# Patient Record
Sex: Female | Born: 1935 | Race: White | Hispanic: No | Marital: Single | Smoking: Never smoker
Health system: Southern US, Community
[De-identification: ages and names within clinical notes are randomized; demographics above are authoritative.]

## PROBLEM LIST (undated history)

## (undated) DIAGNOSIS — Z95820 Peripheral vascular angioplasty status with implants and grafts: Secondary | ICD-10-CM

## (undated) DIAGNOSIS — E7800 Pure hypercholesterolemia, unspecified: Secondary | ICD-10-CM

## (undated) DIAGNOSIS — C44301 Unspecified malignant neoplasm of skin of nose: Secondary | ICD-10-CM

## (undated) DIAGNOSIS — I10 Essential (primary) hypertension: Secondary | ICD-10-CM

## (undated) HISTORY — PX: TONSILLECTOMY: SUR1361

## (undated) HISTORY — PX: APPENDECTOMY: SHX54

---

## 1998-10-18 ENCOUNTER — Other Ambulatory Visit: Admission: RE | Admit: 1998-10-18 | Discharge: 1998-10-18 | Payer: Self-pay | Admitting: Family Medicine

## 1999-04-22 ENCOUNTER — Encounter: Admission: RE | Admit: 1999-04-22 | Discharge: 1999-04-22 | Payer: Self-pay | Admitting: Family Medicine

## 1999-04-22 ENCOUNTER — Encounter: Payer: Self-pay | Admitting: Family Medicine

## 1999-04-29 ENCOUNTER — Ambulatory Visit (HOSPITAL_BASED_OUTPATIENT_CLINIC_OR_DEPARTMENT_OTHER): Admission: RE | Admit: 1999-04-29 | Discharge: 1999-04-29 | Payer: Self-pay | Admitting: Plastic Surgery

## 1999-04-29 ENCOUNTER — Encounter (INDEPENDENT_AMBULATORY_CARE_PROVIDER_SITE_OTHER): Payer: Self-pay | Admitting: Specialist

## 1999-12-06 ENCOUNTER — Other Ambulatory Visit: Admission: RE | Admit: 1999-12-06 | Discharge: 1999-12-06 | Payer: Self-pay | Admitting: Family Medicine

## 2000-01-26 ENCOUNTER — Ambulatory Visit (HOSPITAL_COMMUNITY): Admission: AD | Admit: 2000-01-26 | Discharge: 2000-01-27 | Payer: Self-pay | Admitting: Cardiology

## 2000-03-21 ENCOUNTER — Ambulatory Visit (HOSPITAL_COMMUNITY): Admission: RE | Admit: 2000-03-21 | Discharge: 2000-03-21 | Payer: Self-pay | Admitting: Gastroenterology

## 2000-03-21 ENCOUNTER — Encounter (INDEPENDENT_AMBULATORY_CARE_PROVIDER_SITE_OTHER): Payer: Self-pay | Admitting: Specialist

## 2000-10-10 ENCOUNTER — Encounter: Admission: RE | Admit: 2000-10-10 | Discharge: 2000-12-26 | Payer: Self-pay | Admitting: Family Medicine

## 2001-01-21 ENCOUNTER — Other Ambulatory Visit: Admission: RE | Admit: 2001-01-21 | Discharge: 2001-01-21 | Payer: Self-pay | Admitting: Family Medicine

## 2002-01-24 ENCOUNTER — Other Ambulatory Visit: Admission: RE | Admit: 2002-01-24 | Discharge: 2002-01-24 | Payer: Self-pay | Admitting: Family Medicine

## 2002-09-23 ENCOUNTER — Ambulatory Visit (HOSPITAL_COMMUNITY): Admission: RE | Admit: 2002-09-23 | Discharge: 2002-09-23 | Payer: Self-pay | Admitting: Neurosurgery

## 2002-09-23 ENCOUNTER — Encounter: Payer: Self-pay | Admitting: Neurosurgery

## 2002-09-25 ENCOUNTER — Ambulatory Visit (HOSPITAL_COMMUNITY): Admission: RE | Admit: 2002-09-25 | Discharge: 2002-09-25 | Payer: Self-pay | Admitting: Neurosurgery

## 2003-02-05 ENCOUNTER — Other Ambulatory Visit: Admission: RE | Admit: 2003-02-05 | Discharge: 2003-02-05 | Payer: Self-pay | Admitting: Family Medicine

## 2003-09-05 ENCOUNTER — Emergency Department (HOSPITAL_COMMUNITY): Admission: EM | Admit: 2003-09-05 | Discharge: 2003-09-05 | Payer: Self-pay | Admitting: Emergency Medicine

## 2003-09-22 ENCOUNTER — Encounter: Admission: RE | Admit: 2003-09-22 | Discharge: 2003-12-21 | Payer: Self-pay | Admitting: Family Medicine

## 2003-11-05 ENCOUNTER — Ambulatory Visit (HOSPITAL_COMMUNITY): Admission: RE | Admit: 2003-11-05 | Discharge: 2003-11-06 | Payer: Self-pay | Admitting: Cardiology

## 2004-01-28 ENCOUNTER — Ambulatory Visit (HOSPITAL_COMMUNITY): Admission: RE | Admit: 2004-01-28 | Discharge: 2004-01-28 | Payer: Self-pay | Admitting: Cardiology

## 2004-03-09 ENCOUNTER — Other Ambulatory Visit: Admission: RE | Admit: 2004-03-09 | Discharge: 2004-03-09 | Payer: Self-pay | Admitting: Family Medicine

## 2005-03-14 ENCOUNTER — Other Ambulatory Visit: Admission: RE | Admit: 2005-03-14 | Discharge: 2005-03-14 | Payer: Self-pay | Admitting: Family Medicine

## 2005-09-25 ENCOUNTER — Encounter: Admission: RE | Admit: 2005-09-25 | Discharge: 2005-10-19 | Payer: Self-pay | Admitting: Family Medicine

## 2006-03-19 ENCOUNTER — Other Ambulatory Visit: Admission: RE | Admit: 2006-03-19 | Discharge: 2006-03-19 | Payer: Self-pay | Admitting: Family Medicine

## 2006-07-23 ENCOUNTER — Emergency Department (HOSPITAL_COMMUNITY): Admission: EM | Admit: 2006-07-23 | Discharge: 2006-07-23 | Payer: Self-pay | Admitting: Emergency Medicine

## 2007-01-02 ENCOUNTER — Other Ambulatory Visit: Admission: RE | Admit: 2007-01-02 | Discharge: 2007-01-02 | Payer: Self-pay | Admitting: Family Medicine

## 2010-07-22 ENCOUNTER — Ambulatory Visit: Payer: Medicare Other | Attending: Orthopedic Surgery | Admitting: Physical Therapy

## 2010-07-22 DIAGNOSIS — M25539 Pain in unspecified wrist: Secondary | ICD-10-CM | POA: Insufficient documentation

## 2010-07-22 DIAGNOSIS — M6281 Muscle weakness (generalized): Secondary | ICD-10-CM | POA: Insufficient documentation

## 2010-07-22 DIAGNOSIS — IMO0001 Reserved for inherently not codable concepts without codable children: Secondary | ICD-10-CM | POA: Insufficient documentation

## 2010-07-22 DIAGNOSIS — R5381 Other malaise: Secondary | ICD-10-CM | POA: Insufficient documentation

## 2010-07-26 ENCOUNTER — Ambulatory Visit (HOSPITAL_COMMUNITY): Payer: Medicare Other | Attending: Family Medicine

## 2010-07-26 DIAGNOSIS — M810 Age-related osteoporosis without current pathological fracture: Secondary | ICD-10-CM | POA: Insufficient documentation

## 2010-07-27 ENCOUNTER — Ambulatory Visit: Payer: Medicare Other | Admitting: Physical Therapy

## 2010-07-29 ENCOUNTER — Ambulatory Visit: Payer: Medicare Other | Attending: Orthopedic Surgery | Admitting: Physical Therapy

## 2010-07-29 DIAGNOSIS — R5381 Other malaise: Secondary | ICD-10-CM | POA: Insufficient documentation

## 2010-07-29 DIAGNOSIS — M25539 Pain in unspecified wrist: Secondary | ICD-10-CM | POA: Insufficient documentation

## 2010-07-29 DIAGNOSIS — M6281 Muscle weakness (generalized): Secondary | ICD-10-CM | POA: Insufficient documentation

## 2010-07-29 DIAGNOSIS — IMO0001 Reserved for inherently not codable concepts without codable children: Secondary | ICD-10-CM | POA: Insufficient documentation

## 2010-08-02 ENCOUNTER — Ambulatory Visit: Payer: Medicare Other | Admitting: Physical Therapy

## 2010-08-04 ENCOUNTER — Ambulatory Visit: Payer: Medicare Other | Admitting: Physical Therapy

## 2010-08-09 ENCOUNTER — Ambulatory Visit: Payer: Medicare Other | Admitting: Physical Therapy

## 2010-08-12 ENCOUNTER — Ambulatory Visit: Payer: Medicare Other | Admitting: Physical Therapy

## 2010-08-16 ENCOUNTER — Ambulatory Visit: Payer: Medicare Other | Admitting: Physical Therapy

## 2010-08-18 ENCOUNTER — Encounter: Payer: Medicare Other | Admitting: Physical Therapy

## 2010-08-23 ENCOUNTER — Encounter: Payer: Medicare Other | Admitting: Physical Therapy

## 2010-08-26 ENCOUNTER — Encounter: Payer: Medicare Other | Admitting: Physical Therapy

## 2010-09-23 ENCOUNTER — Other Ambulatory Visit: Payer: Self-pay | Admitting: Family Medicine

## 2010-09-27 ENCOUNTER — Ambulatory Visit
Admission: RE | Admit: 2010-09-27 | Discharge: 2010-09-27 | Disposition: A | Discharge status: Home / Self Care | Payer: Medicare Other | Source: Ambulatory Visit | Attending: Family Medicine | Admitting: Family Medicine

## 2010-09-29 ENCOUNTER — Other Ambulatory Visit: Payer: Self-pay | Admitting: Family Medicine

## 2010-09-29 DIAGNOSIS — N281 Cyst of kidney, acquired: Secondary | ICD-10-CM

## 2010-10-03 ENCOUNTER — Ambulatory Visit
Admission: RE | Admit: 2010-10-03 | Discharge: 2010-10-03 | Disposition: A | Discharge status: Home / Self Care | Payer: Medicare Other | Source: Ambulatory Visit | Attending: Family Medicine | Admitting: Family Medicine

## 2010-10-03 DIAGNOSIS — N281 Cyst of kidney, acquired: Secondary | ICD-10-CM

## 2010-10-14 NOTE — Procedures (Signed)
[facility]  Patient:    Jennifer, Gordon                        MRN: [id number] Proc. Date: 03/21/00 Adm. Date:  [id number] Disc. Date: [id number] Attending:  Corliss Marcus CC:         Dr. Illa Level   Procedure Report  PROCEDURE:  Colonoscopy with polypectomy.  INDICATION FOR PROCEDURE:  Screening colonoscopy in a patient with no prior screening in the last 10 years, who has also had some nonspecific abdominal complaints.  DESCRIPTION OF PROCEDURE:  The patient was placed in the left lateral decubitus position and placed on the pulse monitor with continuous low-flow oxygen delivered by nasal cannula.  She was sedated with 60 mg IV Demerol and 5 mg IV Versed.  The Olympus video colonoscope was inserted into the rectum and advanced to the cecum, confirmed by transillumination at McBurneys point and visualization of the ileocecal valve and appendiceal orifice.  The prep was excellent.  The cecum, ascending, transverse, descending, and proximal sigmoid colon all appeared normal with no masses, polyps, diverticula, or other mucosal abnormalities.  At approximately 20 cm near the rectosigmoid junction, there was seen a 6 mm sessile polyp, which was fulgurated by hot biopsy.  The remainder of the sigmoid and rectum appeared normal, and on retroflex view the anus revealed no obvious internal hemorrhoids.  The colonoscope was then withdrawn and the patient returned to the recovery room in stable condition.  She tolerated the procedure well, and there were no immediate complications.  IMPRESSION:  Small rectosigmoid colon polyp, otherwise normal colonoscopy.  PLAN:  Await histology for determination of need for future surveillance colonoscopies. DD:  03/21/00 TD:  03/22/00 Job: [id number]

## 2010-10-14 NOTE — Cardiovascular Report (Signed)
NAME:  Jennifer Gordon, Jennifer Gordon                         ACCOUNT NO.:  [id number]   MEDICAL RECORD NO.:  [id number]                   PATIENT TYPE:  OIB   LOCATION:  6527                                 FACILITY:  [facility]   PHYSICIAN:  Francisca December, M.D.               DATE OF BIRTH:  04-Jul-1921   DATE OF PROCEDURE:  11/05/2003  DATE OF DISCHARGE:  11/06/2003                              CARDIAC CATHETERIZATION   PROCEDURE PERFORMED:  1. Left heart catheterization.  2. Coronary angiography.  3. Left ventriculogram.  4. Arterial conduit angiography.  5. PCI/drug-eluting stent implantation mid LAD.  6. Intravascular ultrasound.   INDICATION:  Jennifer Gordon is a 77 year old woman with known ASCVD, status post  PCI/stent implantation LCX in 1998 and RCA in 2001.  She has had persistent  and worsening exertional dyspnea which has been her presenting symptom  previously.  Recent Cardiolite with pharmacologic stress revealed a moderate  reversible anterior, anterolateral and anteroapical defect.  She is  therefore brought to the catheterization laboratory at this time to identify  the extent of disease and provide for further therapeutic options.   PROCEDURAL NOTE:  The patient was brought to the cardiac catheterization  laboratory in the fasting state.  The right groin was prepped and draped in  the usual sterile fashion.  Local anesthesia was obtained with the  infiltration of 1% lidocaine.  A 5 French catheter sheath was inserted  percutaneously into the right femoral artery utilizing an anterior approach  over a guiding J wire.  A 110-cm pigtail catheter was used to measure  pressures in the ascending aorta and in the left ventricle both prior to and  following the ventriculogram.  A 30-degree RAO cine left ventriculogram was  performed utilizing a power injector.  43 mL of contrast material were  injected at 13 mL per second.  Cineangiography was then performed using 5  [place] #4 left and right  Judkins catheters.  Following this, sublingual  administration of 0.4 mg nitroglycerin and cineangiography of each coronary  artery was conducted in multiple LAO and RAO projections.  All catheter  manipulations were performed using fluoroscopic observation and exchanges  performed over a long guiding J wire.   I then prepared for intravascular ultrasound.  The 5 French catheter and  sheath was exchanged for a 6 French catheter sheath.  The patient received  3000 units of heparin intravenously resulting in an ACT of only 123.  Therefore, she received an additional 1500 units of heparin resulting in an  ACT of 297 seconds.  A 6 French #3.5 CLS guiding catheter was advanced to  the ascending aorta where the left coronary os was engaged.  A 0.014 inch  Scimed Luge intracoronary guide wire was passed across a moderate appearing  obstruction in the mid LAD.  The Scimed Atlantis ultrasound catheter was  used to image the mid portion of the LAD in a  single pullback.  The images  were analyzed and a decision made to proceed with coronary intervention.  The patient received a bolus of Aggrastat of 25 mcg/kg intravenously and  then a constant infusion of 0.15 mcg/kg/min.  Initial attempt was made to  dilate the lesion with a 2.5/6 mm Scimed cutting balloon.  However, could  not get this device to cross the lesion.  In removing the device, the guide  catheter position was lost as well as the guide wire.  The entire guiding  catheter and wire were removed.  A 4.0 CLS 6 [place] guiding catheter was  then advanced to the ascending aorta where the left coronary os was again  engaged.  A 0.014 inch Scimed Luge wire was used to cross the lesion again.  The lesion was then dilated using a 2.5/9 mm [place] Monorail.  This was inflated  to 9 atmospheres for 15 seconds.  This balloon was deflated and removed and  a 2.75/12 mm Scimed Taxus Express 2 intracoronary drug-eluting stent was  advanced in position.  It was  carefully deployed at a peak pressure of 12  atmospheres for 33 seconds.  The stent balloon was then removed and IVUS  intravascular ultrasound repeated.  It showed incomplete expansion of the  stent at the lesion and incomplete stent strut deployment in the proximal  portion of the stent.  Therefore, a 3.0/9 mm Scimed [place] Monorail balloon was  advanced into place and positioned in the proximal portion of the stent and  inflated to 16 atmospheres for 54 seconds.  This ballon was removed and  intravascular ultrasound repeated.  It showed good stent strut apposition  and adequate expansion of the lesion.  Therefore, the guide wire was removed  and angiography repeated in orthogonal views.  Finally, the guiding catheter  was removed and the catheter sheath was flushed.  A 45-degree LAO angiogram  of the right femoral artery was performed using hand injection.  It  documented the arteriotomy site to be well above the bifurcation into the  profunda femoris and superficial femoral arteries and there was no extensive  atherosclerosis in the femoral artery which was at least 6-7 mm in diameter.  Subsequent hemostasis was achieved by deployment of the Angio-Seal system  which was completely successful.  The patient was then removed from the  catheterization table and transported to the recovery area  in stable  condition with an intact distal pulse.   HEMODYNAMICS:  Systemic arterial pressure was 149/63 with a mean of 100  mmHg.  There was no systolic gradient across the aortic valve.  The left  ventricular end-diastolic pressure was 11 mmHg preventriculogram.   ANGIOGRAPHY:  The left ventriculogram demonstrated normal chamber size and  normal global systolic function.  There was extensive coronary calcification  seen.  No regional wall motion abnormalities were present.  A visual  estimate of the ejection fraction is in the 65-70% range.  There was no significant mitral regurgitation in the aortic  valve the trileaflet and  opens normally during systole.   There was a right dominant coronary system present.  The main left coronary  artery showed a 10% proximal narrowing.  The left anterior descending artery  and its branches were highly diseased.  There was an eccentric ill-defined  hazy mid vessel stenosis at the origin of a second diagonal branch.  The  second diagonal branch itself was small to moderate in size.  The ongoing  anterior descending artery was tortuous  and reached as well as barely  traversed the apex.  There was a 30-40% stenosis in the distal portion at  the origin of the third diagonal branch.  The mid vessel lesion is the  target for the percutaneous treatment today.  By angiography, the degree of  stenosis was not greater than 50%.   The left circumflex coronary artery and its branches were widely patent.  This is the site of previous stent implantation.  The stent is widely  patent.  There is a 30% narrowing at the origin of the circumflex.  The  circumflex amounts to a single large marginal which bifurcates on the  posterior lateral wall of the heart and there is no significant distal  obstructions.   The right coronary artery and its branches were highly diseased.  This  vessel contains a mid portion stent which is approximately 20-25 mm in  length.  There is perhaps 40% in-stent diffuse restenosis.  There is no  significant proximal or distal obstructions.  Large posterior descending and  large posterior lateral segment arise.  The posterior descending artery  contains a 50% proximal narrowing and the posterior lateral segment gives  rise to three small left ventricular branches.   Collateral vessels are not seen.   Following intravascular ultrasound, balloon dilatation, drug-eluting stent  implantation, there was no residual stenosis in the anterior descending  artery by angiography.  There was a good stepup and stepdown at the  proximal and distal  end of the stent.  A diagonal branch arises within the  proximal portion of the stent and is generally unaffected.   INTRAVASCULAR ULTRASOUND:  Initial reference vessel and lesion dimensions  are as follows.  The reference is 3.2 x 2.6 mm in diameter.  The lesion is  1.7 x 1.3 mm in diameter.  There is a 73% stenosis by ultrasound.  Following  balloon dilatation, stent implantation there is a 2.7 x 2.5 minimal luminal  dimension and the luminal area is 5.8 sq mm.   FINAL IMPRESSION:  1. Atherosclerotic coronary vascular disease, three vessel.  2. Status post successful drug-eluting stent implantation mid left anterior     descending with intravascular ultrasound guidance.  3. Intact left ventricular size and global systolic function.  4. Atypical angina was reproduced with balloon inflation and device     insertion.  This was described as warm feeling in the chest.                                               Francisca December, M.D.    JHE/MEDQ  D:  11/05/2003  T:  11/07/2003  Job:  [id number]   cc:   Chales Salmon. Abigail Miyamoto, M.D.  [address]  [place]  [place] [address]  Fax: [phone]

## 2010-10-14 NOTE — Cardiovascular Report (Signed)
[place]  Patient:    Jennifer, Gordon                        MRN: [id number] Proc. Date: 01/26/00 Attending:  Francisca December, M.D. CC:         Chales Salmon. Abigail Miyamoto, M.D.             The Cardiac Catheterization Laboratory                        Cardiac Catheterization  PROCEDURES PERFORMED: 1. Left heart catheterization. 2. Coronary angiography. 3. Left ventriculogram. 4. Percutaneous coronary intervention/stent implantation mid right coronary    artery.  INDICATIONS:  [name] is a 77 year old woman with known ASCVD who is now three years status post PTCA and stent implantation in the left circumflex coronary artery.  Her presenting symptom was dyspnea.  She has had a recurrence of this problem but not quite as severe.  She has undergone a myocardial perfusion study which reproduced her symptoms of moderate dyspnea. She had a reversible lateral wall defect.  She is brought now to the catheterization laboratory to identify possible restenosis of the circumflex marginal branch and/or progressive disease and to provide for further therapeutic options.  PROCEDURAL NOTE:  A left heart catheterization was performed following percutaneous insertion of a #5 French catheter sheath utilizing an anterior approach of her guiding J wire into the right femoral artery.  A 110 cm pigtail catheter was used to measure pressures in the left ventricle and in the aorta, as well as perform left ventriculography.  Then #5 [place] #4 left and right Judkins catheters were used for coronary angiography.  The stented segment of the left circumflex was widely patent.  However, there was a progressive stenosis, 70% tubular in nature, of the mid right coronary artery which represented a changed from her previous angiogram.  The decision was made to proceed with stent implantation in this lesion despite the discordants of this coronary territory with that found on  the Cardiolite.  It did, however, represent a progression of disease.  The #5 [place] catheter sheath was exchanged over a long guiding J wire for a #7 [place] catheter sheath.  A #6 French catheter sheath was inserted into the right femoral vein for intravenous access.  A #7 [place] FR4 Sci-Med wise guide guiding catheter was advanced to the ascending aorta where a right coronary os was engaged.  A 0.014 Sci-Med Luge intracoronary guide wire was passed across the lesion without difficulty.  The lesion was primarily stented with a 3.0/18 mm Sci-Med ______ intracoronary stent.  The pressure was 15 atm, giving an effective balloon diameter of 3.3 mm.  This resulted in an excellent angiographic appearance with a step-up and step-down at the proximal and distal ends of the stent respectively.  It should be noted that the patient did not experience angina pectoris during complete balloon inflation, although there was significant ST segment elevation seen on ECG.  Following confirmation of adequate patency in orthogonal views, both with and without the guide wire in place, the guiding catheter was removed.  Hemostasis was achieved by suturing the sheath in place.  The patient was returned to the recovery area in stable condition with intact distal pulses.  It should be noted that the patient received 4000 units of heparin intravenously prior to initiation of the angioplasty procedure.  She also received an intravenous bolus of  ReoPro in a confident infusion.  HEMODYNAMICS:  Systemic arterial pressure was 150/66 with a mean of 99 mmHg. There was no systolic gradient across the aortic valve.  Left ventricular end diastolic pressure was 16 mmHg.  ANGIOGRAPHY:  The left ventriculogram demonstrated normal left ventricular chamber size and global systolic function without regional wall motion abnormality.  There was left and right coronary calcifications seen, as well as this previously stented  segment.  There was no mitral regurgitation.  The calculated ejection fraction, utilizing a single plane Cine method was 62%.  There was a right dominant coronary system present.  The main left coronary had a 30% narrowing proximally.  The left anterior descending artery and its branches were mildly diseased; there is a 30-40% mid vessel obstruction at this site of the second diagonal branch.  Two diagonal branches arise proximally and are relatively small.  The ongoing vessel is large, transapical, and minimally diseased.  The left circumflex artery and its branches are diseased but without significant obstruction.  The stented segment is in the mid portion of the left circumflex and is widely patent.  There is a very small first marginal branch.  The ongoing circumflex gives rise to a trifurcating marginal branch, which is the major vessel on the posterolateral wall of the heart.  The right coronary artery and its branches are highly diseased; this vessel has a mid portion, tubular, eccentric, 70% stenosis.  The ongoing vessel is large and without significant obstruction.  It gives rise to a large posterior descending artery, a large posterolateral segment, and two small posterolateral branches.  Following balloon dilatation and stent implantation, there was no residual stenosis seen.  In fact, there was a -10% obstruction in that the stented segment was slightly larger than the native artery.  FINAL DIAGNOSES: 1. Atherosclerotic cardiovascular disease, two-vessel. 2. Status post successful percutaneous transluminal coronary angioplasty and    stent implantation, mid right coronary artery. 3. Typical angina was not reproduced with the vice insertion or balloon    inflation. DD:  01/26/00 TD:  01/26/00 Job: [id number]

## 2011-09-01 ENCOUNTER — Other Ambulatory Visit (HOSPITAL_COMMUNITY): Payer: Self-pay | Admitting: *Deleted

## 2011-09-06 ENCOUNTER — Encounter (HOSPITAL_COMMUNITY): Payer: Self-pay

## 2011-09-06 ENCOUNTER — Encounter (HOSPITAL_COMMUNITY)
Admission: RE | Admit: 2011-09-06 | Discharge: 2011-09-06 | Disposition: A | Discharge status: Home / Self Care | Payer: Medicare Other | Source: Ambulatory Visit | Attending: Family Medicine | Admitting: Family Medicine

## 2011-09-06 DIAGNOSIS — M810 Age-related osteoporosis without current pathological fracture: Secondary | ICD-10-CM | POA: Insufficient documentation

## 2011-09-06 HISTORY — DX: Essential (primary) hypertension: I10

## 2011-09-06 HISTORY — DX: Peripheral vascular angioplasty status with implants and grafts: Z95.820

## 2011-09-06 HISTORY — DX: Pure hypercholesterolemia, unspecified: E78.00

## 2011-09-06 HISTORY — DX: Unspecified malignant neoplasm of skin of nose: C44.301

## 2011-09-06 MED ORDER — SODIUM CHLORIDE 0.9 % IV SOLN
Freq: Once | INTRAVENOUS | Status: AC
  Administered 2011-09-06: 14:00:00 via INTRAVENOUS

## 2011-09-06 MED ORDER — ZOLEDRONIC ACID 5 MG/100ML IV SOLN
5.0000 mg | Freq: Once | INTRAVENOUS | Status: AC
  Administered 2011-09-06: 5 mg via INTRAVENOUS
  Filled 2011-09-06: qty 100

## 2011-09-06 NOTE — Discharge Instructions (Signed)
Zoledronic Acid injection (Paget's Disease, Osteoporosis) What is this medicine? ZOLEDRONIC ACID (ZOE le dron ik AS id) lowers the amount of calcium loss from bone. It is used to treat Paget's disease and osteoporosis in women. This medicine may be used for other purposes; ask your health care provider or pharmacist if you have questions. What should I tell my health care provider before I take this medicine? They need to know if you have any of these conditions: -aspirin-sensitive asthma -dental disease -kidney disease -low levels of calcium in the blood -past surgery on the parathyroid gland or intestines -an unusual or allergic reaction to zoledronic acid, other medicines, foods, dyes, or preservatives -pregnant or trying to get pregnant -breast-feeding How should I use this medicine? This medicine is for infusion into a vein. It is given by a health care professional in a hospital or clinic setting. Talk to your pediatrician regarding the use of this medicine in children. This medicine is not approved for use in children. Overdosage: If you think you have taken too much of this medicine contact a poison control center or emergency room at once. NOTE: This medicine is only for you. Do not share this medicine with others. What if I miss a dose? It is important not to miss your dose. Call your doctor or health care professional if you are unable to keep an appointment. What may interact with this medicine? -certain antibiotics given by injection -NSAIDs, medicines for pain and inflammation, like ibuprofen or naproxen -some diuretics like bumetanide, furosemide -teriparatide This list may not describe all possible interactions. Give your health care provider a list of all the medicines, herbs, non-prescription drugs, or dietary supplements you use. Also tell them if you smoke, drink alcohol, or use illegal drugs. Some items may interact with your medicine. What should I watch for while  using this medicine? Visit your doctor or health care professional for regular checkups. It may be some time before you see the benefit from this medicine. Do not stop taking your medicine unless your doctor tells you to. Your doctor may order blood tests or other tests to see how you are doing. Women should inform their doctor if they wish to become pregnant or think they might be pregnant. There is a potential for serious side effects to an unborn child. Talk to your health care professional or pharmacist for more information. You should make sure that you get enough calcium and vitamin D while you are taking this medicine. Discuss the foods you eat and the vitamins you take with your health care professional. Some people who take this medicine have severe bone, joint, and/or muscle pain. This medicine may also increase your risk for a broken thigh bone. Tell your doctor right away if you have pain in your upper leg or groin. Tell your doctor if you have any pain that does not go away or that gets worse. What side effects may I notice from receiving this medicine? Side effects that you should report to your doctor or health care professional as soon as possible: -allergic reactions like skin rash, itching or hives, swelling of the face, lips, or tongue -breathing problems -changes in vision -feeling faint or lightheaded, falls -jaw burning, cramping, or pain -muscle cramps, stiffness, or weakness -trouble passing urine or change in the amount of urine Side effects that usually do not require medical attention (report to your doctor or health care professional if they continue or are bothersome): -bone, joint, or muscle pain -fever -  irritation at site where injected -loss of appetite -nausea, vomiting -stomach upset -tired This list may not describe all possible side effects. Call your doctor for medical advice about side effects. You may report side effects to FDA at 1-800-FDA-1088. Where  should I keep my medicine? This drug is given in a hospital or clinic and will not be stored at home. NOTE: This sheet is a summary. It may not cover all possible information. If you have questions about this medicine, talk to your doctor, pharmacist, or health care provider.  2012, [employer] Standard. (11/11/2010 9:08:15 AM) 

## 2013-01-03 DIAGNOSIS — M170 Bilateral primary osteoarthritis of knee: Secondary | ICD-10-CM | POA: Insufficient documentation

## 2013-07-02 ENCOUNTER — Ambulatory Visit: Payer: Medicare Other | Attending: Family Medicine | Admitting: Physical Therapy

## 2013-07-02 DIAGNOSIS — H8110 Benign paroxysmal vertigo, unspecified ear: Secondary | ICD-10-CM | POA: Insufficient documentation

## 2013-07-03 ENCOUNTER — Ambulatory Visit: Payer: Medicare Other | Admitting: Physical Therapy

## 2013-07-09 ENCOUNTER — Ambulatory Visit: Payer: Medicare Other | Admitting: Physical Therapy

## 2013-07-16 ENCOUNTER — Encounter: Payer: Medicare Other | Admitting: Physical Therapy

## 2013-07-19 ENCOUNTER — Encounter: Payer: Self-pay | Admitting: Interventional Cardiology

## 2013-07-19 DIAGNOSIS — Z95820 Peripheral vascular angioplasty status with implants and grafts: Secondary | ICD-10-CM | POA: Insufficient documentation

## 2013-07-19 DIAGNOSIS — I10 Essential (primary) hypertension: Secondary | ICD-10-CM | POA: Insufficient documentation

## 2013-07-19 DIAGNOSIS — E7800 Pure hypercholesterolemia, unspecified: Secondary | ICD-10-CM | POA: Insufficient documentation

## 2013-07-19 DIAGNOSIS — C44301 Unspecified malignant neoplasm of skin of nose: Secondary | ICD-10-CM | POA: Insufficient documentation

## 2013-07-19 DIAGNOSIS — E782 Mixed hyperlipidemia: Secondary | ICD-10-CM | POA: Insufficient documentation

## 2013-07-23 ENCOUNTER — Ambulatory Visit: Payer: Medicare Other | Admitting: Interventional Cardiology

## 2013-07-24 ENCOUNTER — Encounter: Payer: Medicare Other | Admitting: Physical Therapy

## 2013-08-06 ENCOUNTER — Ambulatory Visit (INDEPENDENT_AMBULATORY_CARE_PROVIDER_SITE_OTHER): Payer: Medicare Other | Admitting: Interventional Cardiology

## 2013-08-06 ENCOUNTER — Encounter: Payer: Self-pay | Admitting: Interventional Cardiology

## 2013-08-06 VITALS — BP 135/53 | HR 69 | Ht 62.0 in | Wt 173.6 lb

## 2013-08-06 DIAGNOSIS — R609 Edema, unspecified: Secondary | ICD-10-CM | POA: Insufficient documentation

## 2013-08-06 DIAGNOSIS — I10 Essential (primary) hypertension: Secondary | ICD-10-CM

## 2013-08-06 DIAGNOSIS — E7800 Pure hypercholesterolemia, unspecified: Secondary | ICD-10-CM

## 2013-08-06 DIAGNOSIS — I2510 Atherosclerotic heart disease of native coronary artery without angina pectoris: Secondary | ICD-10-CM | POA: Insufficient documentation

## 2013-08-06 NOTE — Progress Notes (Signed)
Patient ID: Jennifer Gordon, female   DOB: [birth year], 80 y.o.   MRN: [id number]    [place], [place], [place]  [address] Phone: [phone] Fax:  [phone]  Date:  08/06/2013   ID:  Jennifer, Gordon 30-Aug-1921, MRN [id number]  PCP:  Default, Provider, MD      History of Present Illness: Jennifer Gordon is a 80 y.o. female who has CAD. She broke her wrist in Christmas 2011. She has recovered. She gets SOB with walking which is unchanged. her walking is limited by back pain. She has had several stents in the past. She sweats with activity. No similar sx to what she had before her stents. THere has been no significant change since last year.  No CP or anginal sx.    Wt Readings from Last 3 Encounters:  08/06/13 173 lb 9.6 oz (78.744 kg)     Past Medical History  Diagnosis Date  . Diabetes mellitus   . Hypertension   . High cholesterol   . S/P angioplasty with stent     #3 stents placed,   . Skin cancer of nose     Current Outpatient Prescriptions  Medication Sig Dispense Refill  . acarbose (PRECOSE) 25 MG tablet Take 25 mg by mouth 2 (two) times daily before lunch and supper.      Marland Kitchen aspirin 81 MG tablet Take 81 mg by mouth daily.      . cycloSPORINE (RESTASIS) 0.05 % ophthalmic emulsion Place 1 drop into both eyes 2 (two) times daily. Use for dry eyes.      . dorzolamide-timolol (COSOPT) 22.3-6.8 MG/ML ophthalmic solution Place 1 drop into both eyes 2 (two) times daily. Use for glaucoma      . glipiZIDE (GLUCOTROL XL) 10 MG 24 hr tablet Take 10 mg by mouth daily.      Marland Kitchen lisinopril (PRINIVIL,ZESTRIL) 10 MG tablet Take 10 mg by mouth daily.      . metFORMIN (GLUCOPHAGE) 1000 MG tablet Take 1,000 mg by mouth 2 (two) times daily with a meal.      . Multiple Vitamin (MULTIVITAMIN) capsule Take 1 capsule by mouth daily. Patient takes Centrum Silver A-Z      . pioglitazone (ACTOS) 45 MG tablet Take 45 mg by mouth daily.      . rosuvastatin (CRESTOR) 10 MG  tablet Take 10 mg by mouth daily.      . traMADol (ULTRAM) 50 MG tablet Take 50 mg by mouth at bedtime as needed.       No current facility-administered medications for this visit.    Allergies:    Allergies  Allergen Reactions  . Codeine Rash    Patient stated, " I don't remember what happens. I think its a body rash."  . Lipitor [Atorvastatin Calcium] Rash    Patient stated, " I don't remember what happens. I think its a body rash."  . Niaspan [Niacin Er] Rash    Patient stated, " I don't remember what happens. I think its a body rash."  . Ticlid [Ticlopidine Hcl] Rash    Pt. Stated, " I can't remember what happens. I think its a body rash."  . Zocor [Simvastatin - High Dose] Rash    Patient stated, " I don't remember what happens. I think its a body rash."    Social History:  The patient  reports that she has never smoked. She does not have any smokeless tobacco history on file. She  reports that she does not drink alcohol or use illicit drugs.   Family History:  The patient's family history is not on file.   ROS:  Please see the history of present illness.  No nausea, vomiting.  No fevers, chills.  No focal weakness.  No dysuria.    All other systems reviewed and negative.   PHYSICAL EXAM: VS:  BP 135/53  Pulse 69  Ht 5\' 2"  (1.575 m)  Wt 173 lb 9.6 oz (78.744 kg)  BMI 31.74 kg/m2 Well nourished, well developed, in no acute distress HEENT: normal Neck: no JVD, no carotid bruits Cardiac:  normal S1, S2; RRR;  Lungs:  clear to auscultation bilaterally, no wheezing, rhonchi or rales Abd: soft, nontender, no hepatomegaly Ext: no edema Skin: warm and dry Neuro:   no focal abnormalities noted  EKG:  NSR    6/14 LDL 88; HDL 54  ASSESSMENT AND PLAN:  Coronary atherosclerosis of native coronary artery  Continue Ecotrin Low Strength Tablet Delayed Release, 81 MG, 1 tablet, Orally, Once a day IMAGING: EKG   Harward,Amy 07/24/2012 03:00:11 PM > Solange Emry,JAY 07/24/2012  03:26:50 PM > NSR, PRWP, unchanged from 2013   Notes: No angina. Walks a little.  Try to increase 2. Essential hypertension, benign  Continue Lisinopril Tablet, 10 MG, 1 tablet, Orally, Once a day Notes: COntrolled.  3. Edema of legs  Notes: Elevate legs at night to help with fluid.  4. Hypercholesterolemia, Mixed  Continue Crestor tablet, 10mg , 1 tablet, Orally, Once a day Notes: LDL 66 in 2013. HDL low. Exercise may help increase HDL. Looking for a new place to exercise.    Signed, Mina Marble, MD, [facility] 08/06/2013 3:56 PM

## 2013-08-06 NOTE — Patient Instructions (Signed)
Your physician wants you to follow-up in: 1 year with Dr. Irish Lack. You will receive a reminder letter in the mail two months in advance. If you don't receive a letter, please call our office to schedule the follow-up appointment.  Your physician recommends that you continue on your current medications as directed. Please refer to the Current Medication list given to you today.

## 2013-09-12 ENCOUNTER — Encounter (HOSPITAL_COMMUNITY): Payer: Medicare Other

## 2013-09-15 ENCOUNTER — Other Ambulatory Visit (HOSPITAL_COMMUNITY): Payer: Self-pay | Admitting: Family Medicine

## 2013-09-15 ENCOUNTER — Encounter (HOSPITAL_COMMUNITY): Payer: Self-pay

## 2013-09-15 ENCOUNTER — Ambulatory Visit (HOSPITAL_COMMUNITY)
Admission: RE | Admit: 2013-09-15 | Discharge: 2013-09-15 | Disposition: A | Discharge status: Home / Self Care | Payer: Medicare Other | Source: Ambulatory Visit | Attending: Family Medicine | Admitting: Family Medicine

## 2013-09-15 DIAGNOSIS — M810 Age-related osteoporosis without current pathological fracture: Secondary | ICD-10-CM | POA: Insufficient documentation

## 2013-09-15 MED ORDER — SODIUM CHLORIDE 0.9 % IV SOLN
Freq: Once | INTRAVENOUS | Status: AC
  Administered 2013-09-15: 12:00:00 via INTRAVENOUS

## 2013-09-15 MED ORDER — ZOLEDRONIC ACID 5 MG/100ML IV SOLN
5.0000 mg | Freq: Once | INTRAVENOUS | Status: AC
  Administered 2013-09-15: 5 mg via INTRAVENOUS
  Filled 2013-09-15: qty 100

## 2013-09-15 NOTE — Discharge Instructions (Signed)

## 2014-08-12 ENCOUNTER — Encounter: Payer: Self-pay | Admitting: Interventional Cardiology

## 2014-08-12 ENCOUNTER — Ambulatory Visit (INDEPENDENT_AMBULATORY_CARE_PROVIDER_SITE_OTHER): Payer: Medicare Other | Admitting: Interventional Cardiology

## 2014-08-12 VITALS — BP 124/54 | HR 74 | Ht 62.0 in | Wt 174.0 lb

## 2014-08-12 DIAGNOSIS — Z9889 Other specified postprocedural states: Secondary | ICD-10-CM

## 2014-08-12 DIAGNOSIS — I10 Essential (primary) hypertension: Secondary | ICD-10-CM

## 2014-08-12 DIAGNOSIS — Z95820 Peripheral vascular angioplasty status with implants and grafts: Secondary | ICD-10-CM

## 2014-08-12 NOTE — Progress Notes (Signed)
Patient ID: Jennifer Gordon, female   DOB: Jun 14, 1921, 81 y.o.   MRN: [id number] Patient ID: Jennifer Gordon, female   DOB: [birth year], 81 y.o.   MRN: [id number]    [place], [place], [place]  [address] Phone: [phone] Fax:  [phone]  Date:  08/12/2014   ID:  Jennifer Gordon, Alvira Aug 23, 1921, MRN [id number]  PCP:  Gennette Pac, MD      History of Present Illness: YALONDA SAMPLE is a 81 y.o. female who has CAD. She is less active.  She gets SOB with walking which is unchanged. Standing and walking is limited by back and knee pain.  No more knee injections planned.  She has had several stents in the past. She sweats with activity. No similar sx to what she had before her stents. THere has been no significant change since last year.  No CP or anginal sx.  She has an abscessed tooth for which she is taking antibiotics.    Wt Readings from Last 3 Encounters:  08/12/14 174 lb (78.926 kg)  08/06/13 173 lb 9.6 oz (78.744 kg)     Past Medical History  Diagnosis Date  . Diabetes mellitus   . Hypertension   . High cholesterol   . S/P angioplasty with stent     #3 stents placed,   . Skin cancer of nose     Current Outpatient Prescriptions  Medication Sig Dispense Refill  . acarbose (PRECOSE) 25 MG tablet Take 25 mg by mouth 2 (two) times daily before lunch and supper.    Marland Kitchen amoxicillin (AMOXIL) 500 MG capsule Take 1 capsule by mouth 3 (three) times daily.  0  . aspirin 81 MG tablet Take 81 mg by mouth daily.    . cycloSPORINE (RESTASIS) 0.05 % ophthalmic emulsion Place 1 drop into both eyes 2 (two) times daily. Use for dry eyes.    . dorzolamide-timolol (COSOPT) 22.3-6.8 MG/ML ophthalmic solution Place 1 drop into both eyes 2 (two) times daily. Use for glaucoma    . glipiZIDE (GLUCOTROL XL) 5 MG 24 hr tablet Take 5 mg by mouth daily with breakfast.    . latanoprost (XALATAN) 0.005 % ophthalmic solution 1 drop at bedtime.    Marland Kitchen lisinopril (PRINIVIL,ZESTRIL)  10 MG tablet Take 10 mg by mouth daily.    . metFORMIN (GLUCOPHAGE) 1000 MG tablet Take 1,000 mg by mouth 2 (two) times daily with a meal.    . Multiple Vitamin (MULTIVITAMIN) capsule Take 1 capsule by mouth daily. Patient takes Centrum Silver A-Z    . pioglitazone (ACTOS) 45 MG tablet Take 45 mg by mouth daily.    . rosuvastatin (CRESTOR) 10 MG tablet Take 10 mg by mouth daily.    . traMADol (ULTRAM) 50 MG tablet Take 50 mg by mouth at bedtime as needed.     No current facility-administered medications for this visit.    Allergies:    Allergies  Allergen Reactions  . Codeine Rash    Patient stated, " I don't remember what happens. I think its a body rash."  . Lipitor [Atorvastatin Calcium] Rash    Patient stated, " I don't remember what happens. I think its a body rash."  . Niaspan [Niacin Er] Rash    Patient stated, " I don't remember what happens. I think its a body rash."  . Ticlid [Ticlopidine Hcl] Rash    Pt. Stated, " I can't remember what happens. I think its a body rash."  .  Zocor [Simvastatin - High Dose] Rash    Patient stated, " I don't remember what happens. I think its a body rash."    Social History:  The patient  reports that she has never smoked. She does not have any smokeless tobacco history on file. She reports that she does not drink alcohol or use illicit drugs.   Family History:  The patient's family history includes Hypertension in her brother.   ROS:  Please see the history of present illness.  No nausea, vomiting.  No fevers, chills.  No focal weakness.  No dysuria.    All other systems reviewed and negative.   PHYSICAL EXAM: VS:  BP 124/54 mmHg  Pulse 74  Ht 5\' 2"  (1.575 m)  Wt 174 lb (78.926 kg)  BMI 31.82 kg/m2 Well nourished, well developed, in no acute distress HEENT: normal Neck: no JVD, no carotid bruits Cardiac:  normal S1, S2; RRR;  Lungs:  clear to auscultation bilaterally, no wheezing, rhonchi or rales Abd: soft, nontender, no  hepatomegaly Ext: no edema Skin: warm and dry Neuro:   no focal abnormalities noted Psych: normal affect  EKG:  NSR    6/14 LDL 88; HDL 54  ASSESSMENT AND PLAN:  Coronary atherosclerosis of native coronary artery  Continue Ecotrin Low Strength Tablet Delayed Release, 81 MG, 1 tablet, Orally, Once a day   Notes: No angina. Even less active this year.  She tries to avoid falling so does not do a lot of strenuous activity.  Her longest walk has to be done holding someone's arm or holding the wall.  She gets around to the movie theater or when she visits [place].  Knee pain limits walking as well.  2. Essential hypertension, benign  Continue Lisinopril Tablet, 10 MG, 1 tablet, Orally, Once a day Notes: COntrolled.  3. Edema of legs  Notes: Elevate legs at night to help with fluid. Uses a recliner frequent;y.  Left foot is the worst.   4. Hypercholesterolemia, Mixed  Continue Crestor tablet, 10mg , 1 tablet, Orally, Once a day Notes: LDL 66 in 2013. HDL low. Followed by PMD. 5. Offered her prn f/u but she prefers to come back in a year.  Signed, Mina Marble, MD, [facility] 08/12/2014 2:46 PM

## 2014-08-12 NOTE — Patient Instructions (Signed)
Your physician recommends that you continue on your current medications as directed. Please refer to the Current Medication list given to you today.  Your physician wants you to follow-up in: 1 year with Dr. Varanasi. You will receive a reminder letter in the mail two months in advance. If you don't receive a letter, please call our office to schedule the follow-up appointment.  

## 2014-08-20 ENCOUNTER — Other Ambulatory Visit: Payer: Self-pay | Admitting: Family Medicine

## 2014-08-20 DIAGNOSIS — R634 Abnormal weight loss: Secondary | ICD-10-CM

## 2014-08-20 DIAGNOSIS — R140 Abdominal distension (gaseous): Secondary | ICD-10-CM

## 2014-08-20 DIAGNOSIS — IMO0001 Reserved for inherently not codable concepts without codable children: Secondary | ICD-10-CM

## 2014-09-02 ENCOUNTER — Other Ambulatory Visit: Payer: Medicare Other

## 2014-09-03 ENCOUNTER — Ambulatory Visit
Admission: RE | Admit: 2014-09-03 | Discharge: 2014-09-03 | Disposition: A | Discharge status: Home / Self Care | Payer: Medicare Other | Source: Ambulatory Visit | Attending: Family Medicine | Admitting: Family Medicine

## 2014-09-03 DIAGNOSIS — IMO0001 Reserved for inherently not codable concepts without codable children: Secondary | ICD-10-CM

## 2014-09-03 DIAGNOSIS — R140 Abdominal distension (gaseous): Secondary | ICD-10-CM

## 2014-09-03 DIAGNOSIS — R634 Abnormal weight loss: Secondary | ICD-10-CM

## 2014-09-30 ENCOUNTER — Other Ambulatory Visit (HOSPITAL_COMMUNITY): Payer: Self-pay | Admitting: Family Medicine

## 2014-09-30 ENCOUNTER — Ambulatory Visit (HOSPITAL_COMMUNITY)
Admission: RE | Admit: 2014-09-30 | Discharge: 2014-09-30 | Disposition: A | Discharge status: Home / Self Care | Payer: Medicare Other | Source: Ambulatory Visit | Attending: Family Medicine | Admitting: Family Medicine

## 2014-09-30 ENCOUNTER — Encounter (HOSPITAL_COMMUNITY): Payer: Self-pay

## 2014-09-30 DIAGNOSIS — M810 Age-related osteoporosis without current pathological fracture: Secondary | ICD-10-CM | POA: Diagnosis present

## 2014-09-30 MED ORDER — SODIUM CHLORIDE 0.9 % IV SOLN
Freq: Once | INTRAVENOUS | Status: AC
  Administered 2014-09-30: 12:00:00 via INTRAVENOUS

## 2014-09-30 MED ORDER — ZOLEDRONIC ACID 5 MG/100ML IV SOLN
5.0000 mg | Freq: Once | INTRAVENOUS | Status: AC
  Administered 2014-09-30: 5 mg via INTRAVENOUS
  Filled 2014-09-30: qty 100

## 2014-09-30 NOTE — Discharge Instructions (Signed)
RECLAST °Zoledronic Acid injection (Paget's Disease, Osteoporosis) °What is this medicine? °ZOLEDRONIC ACID (ZOE le dron ik AS id) lowers the amount of calcium loss from bone. It is used to treat Paget's disease and osteoporosis in women. °This medicine may be used for other purposes; ask your health care provider or pharmacist if you have questions. °COMMON BRAND NAME(S): Reclast, Zometa °What should I tell my health care provider before I take this medicine? °They need to know if you have any of these conditions: °-aspirin-sensitive asthma °-cancer, especially if you are receiving medicines used to treat cancer °-dental disease or wear dentures °-infection °-kidney disease °-low levels of calcium in the blood °-past surgery on the parathyroid gland or intestines °-receiving corticosteroids like dexamethasone or prednisone °-an unusual or allergic reaction to zoledronic acid, other medicines, foods, dyes, or preservatives °-pregnant or trying to get pregnant °-breast-feeding °How should I use this medicine? °This medicine is for infusion into a vein. It is given by a health care professional in a hospital or clinic setting. °Talk to your pediatrician regarding the use of this medicine in children. This medicine is not approved for use in children. °Overdosage: If you think you have taken too much of this medicine contact a poison control center or emergency room at once. °NOTE: This medicine is only for you. Do not share this medicine with others. °What if I miss a dose? °It is important not to miss your dose. Call your doctor or health care professional if you are unable to keep an appointment. °What may interact with this medicine? °-certain antibiotics given by injection °-NSAIDs, medicines for pain and inflammation, like ibuprofen or naproxen °-some diuretics like bumetanide, furosemide °-teriparatide °This list may not describe all possible interactions. Give your health care provider a list of all the  medicines, herbs, non-prescription drugs, or dietary supplements you use. Also tell them if you smoke, drink alcohol, or use illegal drugs. Some items may interact with your medicine. °What should I watch for while using this medicine? °Visit your doctor or health care professional for regular checkups. It may be some time before you see the benefit from this medicine. Do not stop taking your medicine unless your doctor tells you to. Your doctor may order blood tests or other tests to see how you are doing. °Women should inform their doctor if they wish to become pregnant or think they might be pregnant. There is a potential for serious side effects to an unborn child. Talk to your health care professional or pharmacist for more information. °You should make sure that you get enough calcium and vitamin D while you are taking this medicine. Discuss the foods you eat and the vitamins you take with your health care professional. °Some people who take this medicine have severe bone, joint, and/or muscle pain. This medicine may also increase your risk for jaw problems or a broken thigh bone. Tell your doctor right away if you have severe pain in your jaw, bones, joints, or muscles. Tell your doctor if you have any pain that does not go away or that gets worse. °Tell your dentist and dental surgeon that you are taking this medicine. You should not have major dental surgery while on this medicine. See your dentist to have a dental exam and fix any dental problems before starting this medicine. Take good care of your teeth while on this medicine. Make sure you see your dentist for regular follow-up appointments. °What side effects may I notice from receiving this   medicine? °Side effects that you should report to your doctor or health care professional as soon as possible: °-allergic reactions like skin rash, itching or hives, swelling of the face, lips, or tongue °-anxiety, confusion, or depression °-breathing  problems °-changes in vision °-eye pain °-feeling faint or lightheaded, falls °-jaw pain, especially after dental work °-mouth sores °-muscle cramps, stiffness, or weakness °-trouble passing urine or change in the amount of urine °Side effects that usually do not require medical attention (report to your doctor or health care professional if they continue or are bothersome): °-bone, joint, or muscle pain °-constipation °-diarrhea °-fever °-hair loss °-irritation at site where injected °-loss of appetite °-nausea, vomiting °-stomach upset °-trouble sleeping °-trouble swallowing °-weak or tired °This list may not describe all possible side effects. Call your doctor for medical advice about side effects. You may report side effects to FDA at 1-800-FDA-1088. °Where should I keep my medicine? °This drug is given in a hospital or clinic and will not be stored at home. °NOTE: This sheet is a summary. It may not cover all possible information. If you have questions about this medicine, talk to your doctor, pharmacist, or health care provider. °© 2015, [employer]. (2012-10-28 10:03:48) °Osteoporosis °Throughout your life, your body breaks down old bone and replaces it with new bone. As you get older, your body does not replace bone as quickly as it breaks it down. By the age of 30 years, most people begin to gradually lose bone because of the imbalance between bone loss and replacement. Some people lose more bone than others. Bone loss beyond a specified normal degree is considered osteoporosis.  °Osteoporosis affects the strength and durability of your bones. The inside of the ends of your bones and your flat bones, like the bones of your pelvis, look like honeycomb, filled with tiny open spaces. As bone loss occurs, your bones become less dense. This means that the open spaces inside your bones become bigger and the walls between these spaces become thinner. This makes your bones weaker. Bones of a person with  osteoporosis can become so weak that they can break (fracture) during minor accidents, such as a simple fall. °CAUSES  °The following factors have been associated with the development of osteoporosis: °· Smoking. °· Drinking more than 2 alcoholic drinks several days per week. °· Long-term use of certain medicines: °¨ Corticosteroids. °¨ Chemotherapy medicines. °¨ Thyroid medicines. °¨ Antiepileptic medicines. °¨ Gonadal hormone suppression medicine. °¨ Immunosuppression medicine. °· Being underweight. °· Lack of physical activity. °· Lack of exposure to the sun. This can lead to vitamin D deficiency. °· Certain medical conditions: °¨ Certain inflammatory bowel diseases, such as Crohn disease and ulcerative colitis. °¨ Diabetes. °¨ Hyperthyroidism. °¨ Hyperparathyroidism. °RISK FACTORS °Anyone can develop osteoporosis. However, the following factors can increase your risk of developing osteoporosis: °· Gender--Women are at higher risk than men. °· Age--Being older than 50 years increases your risk. °· Ethnicity--White and Asian people have an increased risk. °· Weight --Being extremely underweight can increase your risk of osteoporosis. °· Family history of osteoporosis--Having a family member who has developed osteoporosis can increase your risk. °SYMPTOMS  °Usually, people with osteoporosis have no symptoms.  °DIAGNOSIS  °Signs during a physical exam that may prompt your caregiver to suspect osteoporosis include: °· Decreased height. This is usually caused by the compression of the bones that form your spine (vertebrae) because they have weakened and become fractured. °· A curving or rounding of the upper back (kyphosis). °  To confirm signs of osteoporosis, your caregiver may request a procedure that uses 2 low-dose X-ray beams with different levels of energy to measure your bone mineral density (dual-energy X-ray absorptiometry [DXA]). Also, your caregiver may check your level of vitamin D. °TREATMENT  °The goal of  osteoporosis treatment is to strengthen bones in order to decrease the risk of bone fractures. There are different types of medicines available to help achieve this goal. Some of these medicines work by slowing the processes of bone loss. Some medicines work by increasing bone density. Treatment also involves making sure that your levels of calcium and vitamin D are adequate. °PREVENTION  °There are things you can do to help prevent osteoporosis. Adequate intake of calcium and vitamin D can help you achieve optimal bone mineral density. Regular exercise can also help, especially resistance and weight-bearing activities. If you smoke, quitting smoking is an important part of osteoporosis prevention. °MAKE SURE YOU: °· Understand these instructions. °· Will watch your condition. °· Will get help right away if you are not doing well or get worse. °FOR MORE INFORMATION °www.osteo.org and www.nof.org °Document Released: 02/22/2005 Document Revised: 09/09/2012 Document Reviewed: 04/29/2011 °Exit[employer]® Patient Information ©2015 [employer]. This information is not intended to replace advice given to you by your health care provider. Make sure you discuss any questions you have with your health care provider. ° ° °

## 2015-09-22 ENCOUNTER — Ambulatory Visit (INDEPENDENT_AMBULATORY_CARE_PROVIDER_SITE_OTHER): Payer: Medicare Other | Admitting: Interventional Cardiology

## 2015-09-22 ENCOUNTER — Encounter: Payer: Self-pay | Admitting: Interventional Cardiology

## 2015-09-22 VITALS — BP 122/58 | HR 66 | Ht 62.0 in | Wt 158.0 lb

## 2015-09-22 DIAGNOSIS — I2510 Atherosclerotic heart disease of native coronary artery without angina pectoris: Secondary | ICD-10-CM

## 2015-09-22 DIAGNOSIS — E7800 Pure hypercholesterolemia, unspecified: Secondary | ICD-10-CM

## 2015-09-22 DIAGNOSIS — I10 Essential (primary) hypertension: Secondary | ICD-10-CM | POA: Diagnosis not present

## 2015-09-22 DIAGNOSIS — R609 Edema, unspecified: Secondary | ICD-10-CM

## 2015-09-22 DIAGNOSIS — Z95820 Peripheral vascular angioplasty status with implants and grafts: Secondary | ICD-10-CM

## 2015-09-22 DIAGNOSIS — Z959 Presence of cardiac and vascular implant and graft, unspecified: Secondary | ICD-10-CM

## 2015-09-22 NOTE — Patient Instructions (Signed)

## 2015-09-22 NOTE — Progress Notes (Signed)
Patient ID: Jennifer Gordon, female   DOB: 31-Dec-1921, 82 y.o.   MRN: [id number]     Cardiology Office Note   Date:  09/22/2015   ID:  Jennifer Gordon, DOB January 19, 1922, MRN [id number]  PCP:  Gennette Pac, MD    No chief complaint on file. f/u CAD   Wt Readings from Last 3 Encounters:  09/22/15 158 lb (71.668 kg)  09/30/14 160 lb (72.576 kg)  08/12/14 174 lb (78.926 kg)       History of Present Illness: Jennifer Gordon is a 82 y.o. female  who has CAD. She is less active. She gets SOB with walking which is unchanged. Standing and walking is limited by back and knee pain. No more knee injections planned. Her hearing is worse also.    She has had several stents in the past. She sweats with activity. No similar sx to what she had before her stents. THere has been no significant change since last year.  She walks short distances.  No CP or anginal sx.     Past Medical History  Diagnosis Date  . Diabetes mellitus   . Hypertension   . High cholesterol   . S/P angioplasty with stent     #3 stents placed,   . Skin cancer of nose     Past Surgical History  Procedure Laterality Date  . Appendectomy    . Tonsillectomy       Current Outpatient Prescriptions  Medication Sig Dispense Refill  . acarbose (PRECOSE) 25 MG tablet Take 25 mg by mouth 2 (two) times daily before lunch and supper.    . Artificial Tear Ointment (ARTIFICIAL TEARS) ointment Place 1 drop into both eyes as needed (DRY EYES).     Marland Kitchen aspirin 81 MG tablet Take 81 mg by mouth daily.    . cycloSPORINE (RESTASIS) 0.05 % ophthalmic emulsion Place 1 drop into both eyes 2 (two) times daily. Use for dry eyes.    . dorzolamide-timolol (COSOPT) 22.3-6.8 MG/ML ophthalmic solution Place 1 drop into both eyes 2 (two) times daily. Use for glaucoma    . glipiZIDE (GLUCOTROL XL) 5 MG 24 hr tablet Take 5 mg by mouth daily with breakfast.    . latanoprost (XALATAN) 0.005 % ophthalmic solution Place 1 drop into both  eyes at bedtime.     Marland Kitchen lisinopril (PRINIVIL,ZESTRIL) 10 MG tablet Take 10 mg by mouth daily.    . metFORMIN (GLUCOPHAGE) 1000 MG tablet Take 1,000 mg by mouth 2 (two) times daily with a meal.    . Multiple Vitamin (MULTIVITAMIN) capsule Take 1 capsule by mouth daily. Patient takes Centrum Silver A-Z    . pioglitazone (ACTOS) 45 MG tablet Take 45 mg by mouth daily.    . rosuvastatin (CRESTOR) 10 MG tablet Take 10 mg by mouth daily.     No current facility-administered medications for this visit.    Allergies:   Codeine; Lipitor; Niaspan; Ticlid; and Zocor    Social History:  The patient  reports that she has never smoked. She does not have any smokeless tobacco history on file. She reports that she does not drink alcohol or use illicit drugs.   Family History:  The patient's family history includes Heart attack in her mother; Hypertension in her brother. There is no history of Stroke.    ROS:  Please see the history of present illness.   Otherwise, review of systems are positive for knee pain.   All other systems are reviewed and  negative.    PHYSICAL EXAM: VS:  BP 122/58 mmHg  Pulse 66  Ht 5\' 2"  (1.575 m)  Wt 158 lb (71.668 kg)  BMI 28.89 kg/m2  SpO2 97% , BMI Body mass index is 28.89 kg/(m^2). GEN: Well nourished, well developed, in no acute distress HEENT: normal Neck: no JVD, carotid bruits, or masses Cardiac: RRR; no murmurs, rubs, or gallops,no edema  Respiratory:  clear to auscultation bilaterally, normal work of breathing GI: soft, nontender, nondistended, + BS MS: no deformity or atrophy Skin: warm and dry, no rash Neuro:  Strength and sensation are intact Psych: euthymic mood, full affect   EKG:   The ekg ordered today demonstrates NSR, low voltage, no ST segment changes   Recent Labs: No results found for requested labs within last 365 days.   Lipid Panel No results found for: CHOL, TRIG, HDL, CHOLHDL, VLDL, LDLCALC, LDLDIRECT   Other studies  Reviewed: Additional studies/ records that were reviewed today with results demonstrating:2011 stress test.   ASSESSMENT AND PLAN:  1. CAD: No angina.  Abnormal stress test in 2011 in the lateral wall territory.  No sx. Continue medical therapy. 2. HTN: Controlled. Continue current meds.  3. Hyperlipidemia: Crestor controlling levels well.  Follwed by Dr. Mason Jim.  4. Leg edema: Resolved with leg elevation.    Current medicines are reviewed at length with the patient today.  The patient concerns regarding her medicines were addressed.  The following changes have been made:  No change  Labs/ tests ordered today include: none  Orders Placed This Encounter  Procedures  . EKG 12-Lead    Recommend 150 minutes/week of aerobic exercise Low fat, low carb, high fiber diet recommended  Disposition:   FU in 1 year   Teresita Madura., MD  09/22/2015 3:00 PM    [facility] [place], [place], [place]  [address] Phone: [phone]; Fax: [phone]

## 2015-10-06 ENCOUNTER — Ambulatory Visit (HOSPITAL_COMMUNITY)
Admission: RE | Admit: 2015-10-06 | Discharge: 2015-10-06 | Disposition: A | Discharge status: Home / Self Care | Payer: Medicare Other | Source: Ambulatory Visit | Attending: Family Medicine | Admitting: Family Medicine

## 2015-10-06 ENCOUNTER — Encounter (HOSPITAL_COMMUNITY): Payer: Self-pay

## 2015-10-06 DIAGNOSIS — M810 Age-related osteoporosis without current pathological fracture: Secondary | ICD-10-CM | POA: Diagnosis not present

## 2015-10-06 MED ORDER — ZOLEDRONIC ACID 5 MG/100ML IV SOLN
5.0000 mg | Freq: Once | INTRAVENOUS | Status: AC
Start: 2015-10-06 — End: 2015-10-06
  Administered 2015-10-06: 5 mg via INTRAVENOUS
  Filled 2015-10-06: qty 100

## 2015-10-06 MED ORDER — SODIUM CHLORIDE 0.9 % IV SOLN
Freq: Once | INTRAVENOUS | Status: AC
  Administered 2015-10-06: 14:00:00 via INTRAVENOUS

## 2015-10-06 NOTE — Discharge Instructions (Signed)

## 2016-10-12 ENCOUNTER — Ambulatory Visit: Payer: Medicare Other | Admitting: Interventional Cardiology

## 2016-11-07 ENCOUNTER — Ambulatory Visit (INDEPENDENT_AMBULATORY_CARE_PROVIDER_SITE_OTHER): Payer: Medicare Other | Admitting: Interventional Cardiology

## 2016-11-07 VITALS — BP 102/52 | HR 73 | Ht 62.0 in | Wt 146.0 lb

## 2016-11-07 DIAGNOSIS — Z959 Presence of cardiac and vascular implant and graft, unspecified: Secondary | ICD-10-CM

## 2016-11-07 DIAGNOSIS — I10 Essential (primary) hypertension: Secondary | ICD-10-CM

## 2016-11-07 DIAGNOSIS — Z95820 Peripheral vascular angioplasty status with implants and grafts: Secondary | ICD-10-CM

## 2016-11-07 DIAGNOSIS — I2510 Atherosclerotic heart disease of native coronary artery without angina pectoris: Secondary | ICD-10-CM

## 2016-11-07 DIAGNOSIS — E7800 Pure hypercholesterolemia, unspecified: Secondary | ICD-10-CM | POA: Diagnosis not present

## 2016-11-07 NOTE — Progress Notes (Signed)
Cardiology Office Note   Date:  11/07/2016   ID:  Jennifer Gordon, DOB November 07, 1921, MRN [id number]  PCP:  Hulan Fess, MD    No chief complaint on file. CAD   Wt Readings from Last 3 Encounters:  11/07/16 146 lb (66.2 kg)  10/06/15 158 lb (71.7 kg)  09/22/15 158 lb (71.7 kg)       History of Present Illness: Jennifer Gordon is a 83 y.o. female  coronary artery disease including several stents many years ago. She has chronic back and knee pain which limit her walking. She has not had any recent falls. She only walks short distances at this time.   Overall, she feels that she has been doing well.   Denies : Chest pain. Dizziness. Leg edema. Nitroglycerin use. Orthopnea. Palpitations. Paroxysmal nocturnal dyspnea. Shortness of breath. Syncope.     Past Medical History:  Diagnosis Date  . Diabetes mellitus   . High cholesterol   . Hypertension   . S/P angioplasty with stent    #3 stents placed,   . Skin cancer of nose     Past Surgical History:  Procedure Laterality Date  . APPENDECTOMY    . TONSILLECTOMY       Current Outpatient Prescriptions  Medication Sig Dispense Refill  . acarbose (PRECOSE) 25 MG tablet Take 25 mg by mouth every evening.     . Artificial Tear Ointment (ARTIFICIAL TEARS) ointment Place 1 drop into both eyes as needed (AS DIRECTED FOR DRY EYES).     Marland Kitchen aspirin 81 MG tablet Take 81 mg by mouth daily.    . cycloSPORINE (RESTASIS) 0.05 % ophthalmic emulsion Place 1 drop into both eyes 2 (two) times daily. Use for dry eyes.    . dorzolamide-timolol (COSOPT) 22.3-6.8 MG/ML ophthalmic solution Place 1 drop into both eyes 2 (two) times daily. Use for glaucoma    . Eyelid Cleansers (OCUSOFT LID SCRUB EX) Apply topically 2 (two) times daily. As directed    . latanoprost (XALATAN) 0.005 % ophthalmic solution Place 1 drop into both eyes at bedtime.     Marland Kitchen lisinopril (PRINIVIL,ZESTRIL) 10 MG tablet Take 10 mg by mouth daily.    . metFORMIN (GLUCOPHAGE)  1000 MG tablet Take 1,000 mg by mouth 2 (two) times daily with a meal.    . Multiple Vitamin (MULTIVITAMIN) capsule Take 1 capsule by mouth daily. Patient takes Centrum Silver A-Z    . NON FORMULARY Place 1 drop into both eyes 2 (two) times daily. Zuidri eye drops    . pioglitazone (ACTOS) 45 MG tablet Take 45 mg by mouth daily.    . rosuvastatin (CRESTOR) 10 MG tablet Take 10 mg by mouth daily.     No current facility-administered medications for this visit.     Allergies:   Codeine; Lipitor [atorvastatin calcium]; Niaspan [niacin er]; Ticlid [ticlopidine hcl]; and Zocor [simvastatin - high dose]    Social History:  The patient  reports that she has never smoked. She does not have any smokeless tobacco history on file. She reports that she does not drink alcohol or use drugs.   Family History:  The patient's family history includes Heart attack in her mother; Hypertension in her brother.    ROS:  Please see the history of present illness.   Otherwise, review of systems are positive for joint pains.   All other systems are reviewed and negative.    PHYSICAL EXAM: VS:  BP (!) 102/52   Pulse 73  Ht 5\' 2"  (1.575 m)   Wt 146 lb (66.2 kg)   SpO2 98%   BMI 26.70 kg/m  , BMI Body mass index is 26.7 kg/m. GEN: Well nourished, well developed, in no acute distress  HEENT: normal  Neck: no JVD, carotid bruits, or masses Cardiac: RRR; no murmurs, rubs, or gallops,no LE edema  Respiratory:  clear to auscultation bilaterally, normal work of breathing GI: soft, nontender, nondistended, + BS MS: no deformity or atrophy  Skin: warm and dry, no rash Neuro:  Strength and sensation are intact Psych: euthymic mood, full affect   EKG:   The ekg ordered today demonstrates NSR, septal Q waves, no ST chnages   Recent Labs: No results found for requested labs within last 8760 hours.   Lipid Panel No results found for: CHOL, TRIG, HDL, CHOLHDL, VLDL, LDLCALC, LDLDIRECT   Other studies  Reviewed: Additional studies/ records that were reviewed today with results demonstrating: *LDL 56 in 4/18.   ASSESSMENT AND PLAN:  1. CAD: Abnormal stress test in 2011 with defect in the lateral wall territory. She has been managed medically since that time. Symptoms controlled on medical therapy. 2. HTN: Continue current medications. Well-controlled. 3. Hyperlipidemia COntroled in 4/18.  Lipids reviewed. 4. Continue leg elevation and caution to avoid falling.   Current medicines are reviewed at length with the patient today.  The patient concerns regarding her medicines were addressed.  The following changes have been made:  No change  Labs/ tests ordered today include:  No orders of the defined types were placed in this encounter.   Recommend 150 minutes/week of aerobic exercise Low fat, low carb, high fiber diet recommended  Disposition:   FU in 1 year   Signed, Larae Grooms, MD  11/07/2016 4:48 PM    [facility] [place], [place], [place]  [address] Phone: [phone]; Fax: [phone]

## 2016-11-07 NOTE — Patient Instructions (Signed)

## 2017-04-20 ENCOUNTER — Encounter (HOSPITAL_COMMUNITY): Payer: Self-pay

## 2017-04-20 ENCOUNTER — Emergency Department (HOSPITAL_COMMUNITY)
Admission: EM | Admit: 2017-04-20 | Discharge: 2017-04-22 | Disposition: A | Discharge status: Skilled Nursing Facility | Payer: Medicare Other | Attending: Emergency Medicine | Admitting: Emergency Medicine

## 2017-04-20 ENCOUNTER — Emergency Department (HOSPITAL_COMMUNITY): Payer: Medicare Other

## 2017-04-20 DIAGNOSIS — Y9301 Activity, walking, marching and hiking: Secondary | ICD-10-CM | POA: Diagnosis not present

## 2017-04-20 DIAGNOSIS — I10 Essential (primary) hypertension: Secondary | ICD-10-CM | POA: Insufficient documentation

## 2017-04-20 DIAGNOSIS — S79912A Unspecified injury of left hip, initial encounter: Secondary | ICD-10-CM | POA: Diagnosis present

## 2017-04-20 DIAGNOSIS — R262 Difficulty in walking, not elsewhere classified: Secondary | ICD-10-CM

## 2017-04-20 DIAGNOSIS — E119 Type 2 diabetes mellitus without complications: Secondary | ICD-10-CM | POA: Insufficient documentation

## 2017-04-20 DIAGNOSIS — X509XXA Other and unspecified overexertion or strenuous movements or postures, initial encounter: Secondary | ICD-10-CM | POA: Diagnosis not present

## 2017-04-20 DIAGNOSIS — Z7984 Long term (current) use of oral hypoglycemic drugs: Secondary | ICD-10-CM | POA: Insufficient documentation

## 2017-04-20 DIAGNOSIS — Y92000 Kitchen of unspecified non-institutional (private) residence as  the place of occurrence of the external cause: Secondary | ICD-10-CM | POA: Diagnosis not present

## 2017-04-20 DIAGNOSIS — Y999 Unspecified external cause status: Secondary | ICD-10-CM | POA: Diagnosis not present

## 2017-04-20 DIAGNOSIS — I2510 Atherosclerotic heart disease of native coronary artery without angina pectoris: Secondary | ICD-10-CM | POA: Insufficient documentation

## 2017-04-20 DIAGNOSIS — S7002XA Contusion of left hip, initial encounter: Secondary | ICD-10-CM | POA: Insufficient documentation

## 2017-04-20 DIAGNOSIS — R51 Headache: Secondary | ICD-10-CM | POA: Diagnosis not present

## 2017-04-20 DIAGNOSIS — W19XXXA Unspecified fall, initial encounter: Secondary | ICD-10-CM

## 2017-04-20 DIAGNOSIS — R109 Unspecified abdominal pain: Secondary | ICD-10-CM | POA: Insufficient documentation

## 2017-04-20 DIAGNOSIS — Z7982 Long term (current) use of aspirin: Secondary | ICD-10-CM | POA: Insufficient documentation

## 2017-04-20 DIAGNOSIS — S52615A Nondisplaced fracture of left ulna styloid process, initial encounter for closed fracture: Secondary | ICD-10-CM | POA: Diagnosis not present

## 2017-04-20 NOTE — ED Triage Notes (Signed)
Patient brought in by EMS for a fall about 8 hours ago. No loss of consciousness. Patient reports she fell again at about 2200 from the pain and soreness from first fall. Patient reports no dizziness, no nausea/vomiting. Patient alert and oriented x4.

## 2017-04-20 NOTE — ED Notes (Signed)
Bed: YT03 Expected date:  Expected time:  Means of arrival:  Comments: EMS 83 yo female from home-fall 8 hours ago-persistant lower back pain

## 2017-04-20 NOTE — ED Triage Notes (Addendum)
Patient states she tripped and fell about 10 hours ago. Patient states she fell again about 5 hours ago. Patient says she fell a 3rd time at about 2200, where she hit her head against the wall and could not get up so she called EMS. Patient reports no head pain just the pain in lower back.

## 2017-04-20 NOTE — ED Provider Notes (Signed)
[facility] DEPT Provider Note   CSN: [id number] Arrival date & time: 04/20/17  2213     History   Chief Complaint Chief Complaint  Patient presents with  . Fall  . Back Pain    HPI Jennifer Gordon is a 83 y.o. female.  The history is provided by the patient.  This afternoon, she was going up a step and lost her balance and fell backward.  She did not think that she was severely injured and spent the rest of the afternoon playing cards.  She got home, and she was walking into her kitchen when her knees just gave out on her and she fell a second time.  She did strike her head a second time.  She was not able to get up but she was able to scoot to the telephone to call her daughter to bring her here.  She is complaining of pain in her lower back.  There is no chest pain.  There is no abdominal pain.  There is no nausea or vomiting.  She denies bowel or bladder dysfunction.  She does take low-dose aspirin but is not on any other anticoagulants.  She does have a known history of diabetes, hypertension, hyperlipidemia, coronary artery disease.  Past Medical History:  Diagnosis Date  . Diabetes mellitus   . High cholesterol   . Hypertension   . S/P angioplasty with stent    #3 stents placed,   . Skin cancer of nose     Patient Active Problem List   Diagnosis Date Noted  . Coronary atherosclerosis of native coronary artery 08/06/2013  . Edema 08/06/2013  . Hypertension   . High cholesterol   . S/P angioplasty with stent   . Skin cancer of nose     Past Surgical History:  Procedure Laterality Date  . APPENDECTOMY    . TONSILLECTOMY      OB History    No data available       Home Medications    Prior to Admission medications   Medication Sig Start Date End Date Taking? Authorizing Provider  acarbose (PRECOSE) 25 MG tablet Take 25 mg by mouth every evening.     [provider]  Artificial Tear Ointment (ARTIFICIAL TEARS)  ointment Place 1 drop into both eyes as needed (AS DIRECTED FOR DRY EYES).     [provider]  aspirin 81 MG tablet Take 81 mg by mouth daily.    [provider]  cycloSPORINE (RESTASIS) 0.05 % ophthalmic emulsion Place 1 drop into both eyes 2 (two) times daily. Use for dry eyes.    [provider]  dorzolamide-timolol (COSOPT) 22.3-6.8 MG/ML ophthalmic solution Place 1 drop into both eyes 2 (two) times daily. Use for glaucoma    [provider]  Eyelid Cleansers (OCUSOFT LID SCRUB EX) Apply topically 2 (two) times daily. As directed    [provider]  latanoprost (XALATAN) 0.005 % ophthalmic solution Place 1 drop into both eyes at bedtime.     [provider]  lisinopril (PRINIVIL,ZESTRIL) 10 MG tablet Take 10 mg by mouth daily.    [provider]  metFORMIN (GLUCOPHAGE) 1000 MG tablet Take 1,000 mg by mouth 2 (two) times daily with a meal.    [provider]  Multiple Vitamin (MULTIVITAMIN) capsule Take 1 capsule by mouth daily. Patient takes [place]    [provider]  NON FORMULARY Place 1 drop into both eyes 2 (two) times  daily. Zuidri eye drops    [provider]  pioglitazone (ACTOS) 45 MG tablet Take 45 mg by mouth daily.    [provider]  rosuvastatin (CRESTOR) 10 MG tablet Take 10 mg by mouth daily.    [provider]    Family History Family History  Problem Relation Age of Onset  . Hypertension Brother   . Heart attack Mother   . Stroke Neg Hx     Social History Social History   Tobacco Use  . Smoking status: Never Smoker  Substance Use Topics  . Alcohol use: No  . Drug use: No     Allergies   Codeine; Lipitor [atorvastatin calcium]; Niaspan [niacin er]; Ticlid [ticlopidine hcl]; and Zocor [simvastatin - high dose]   Review of Systems Review of Systems  All other systems reviewed and are negative.    Physical Exam Updated Vital Signs BP  (!) 132/57 (BP Location: Left Arm)   Pulse 90   Temp 97.7 F (36.5 C) (Oral)   Resp 20   Ht 5\' 2"  (1.575 m)   Wt 61.2 kg (135 lb)   SpO2 95%   BMI 24.69 kg/m   Physical Exam  Nursing note and vitals reviewed.  83 year old female, resting comfortably and in no acute distress. Vital signs are normal. Oxygen saturation is 95%, which is normal. Head is normocephalic and atraumatic. PERRLA, EOMI. Oropharynx is clear. Neck is nontender without adenopathy or JVD. Back is nontender and there is no CVA tenderness. Lungs are clear without rales, wheezes, or rhonchi. Chest is nontender. Heart has regular rate and rhythm without murmur. Abdomen is soft, flat, with mild to moderate tenderness across the mid abdomen.  There is no rebound or guarding.  There are no masses or hepatosplenomegaly and peristalsis is normoactive.  Pelvis is stable and nontender. Extremities have no cyanosis or edema, full range of motion is present.  There is moderate tenderness rather diffusely throughout the right lower leg without any swelling or deformity. Skin is warm and dry without rash. Neurologic: Mental status is normal, cranial nerves are intact.  There is moderate weakness of both legs.  There are no sensory deficits.  ED Treatments / Results  Labs (all labs ordered are listed, but only abnormal results are displayed) Labs Reviewed  COMPREHENSIVE METABOLIC PANEL - Abnormal; Notable for the following components:      Result Value   Glucose, Bld 163 (*)    All other components within normal limits  CBC WITH DIFFERENTIAL/PLATELET - Abnormal; Notable for the following components:   RDW 16.7 (*)    All other components within normal limits  CK - Abnormal; Notable for the following components:   Total CK 31 (*)    All other components within normal limits  URINALYSIS, ROUTINE W REFLEX MICROSCOPIC - Abnormal; Notable for the following components:   Specific Gravity, Urine 1.043 (*)    Leukocytes, UA TRACE  (*)    All other components within normal limits  LIPASE, BLOOD    Radiology Dg Wrist Complete Left  Result Date: 04/21/2017 CLINICAL DATA:  Left wrist pain after multiple falls yesterday. EXAM: LEFT WRIST - COMPLETE 3+ VIEW COMPARISON:  None. FINDINGS: Cortical irregularity along the base of the left radial styloid process with mild volar angulation suggesting an nondisplaced fracture. Mild soft tissue swelling. Degenerative changes in the radiocarpal, STT, and first carpometacarpal joints. Calcification in the triangular fibrocartilage. No focal bone lesion or bone destruction. IMPRESSION: Nondisplaced fracture along the  base of the left radial styloid process. Degenerative changes in the wrist. Electronically Signed   By: Lucienne Capers M.D.   On: 04/21/2017 04:52   Dg Tibia/fibula Right  Result Date: 04/20/2017 CLINICAL DATA:  Three falls over the past 12 hours. Right lower extremity pain. EXAM: RIGHT TIBIA AND FIBULA - 2 VIEW COMPARISON:  None. FINDINGS: There is no evidence of fracture or other focal bone lesions. Osteoarthritis of the knee. Soft tissues are unremarkable. IMPRESSION: No fracture of the right lower leg. Electronically Signed   By: Jeb Levering M.D.   On: 04/20/2017 23:39   Ct Head Wo Contrast  Result Date: 04/21/2017 CLINICAL DATA:  Two falls today.  No loss of consciousness. EXAM: CT HEAD WITHOUT CONTRAST CT CERVICAL SPINE WITHOUT CONTRAST TECHNIQUE: Multidetector CT imaging of the head and cervical spine was performed following the standard protocol without intravenous contrast. Multiplanar CT image reconstructions of the cervical spine were also generated. COMPARISON:  None. FINDINGS: CT HEAD FINDINGS Brain: Diffuse cerebral atrophy. Mild ventricular dilatation consistent with central atrophy. Low-attenuation changes in the deep white matter consistent with small vessel ischemia. No mass-effect or midline shift. No abnormal extra-axial fluid collections. Gray-white  matter junctions are distinct. Basal cisterns are not effaced. No acute intracranial hemorrhage. Vascular: Intracranial arterial vascular calcifications are present. Skull: Calvarium appears intact. Sinuses/Orbits: Paranasal sinuses and mastoid air cells are not opacified. Other: None. CT CERVICAL SPINE FINDINGS Alignment: Straightening of usual cervical lordosis without anterior subluxation. This is likely due to patient positioning but ligamentous injury or muscle spasm could also have this appearance and are not excluded. Normal alignment of the facet joints. C1-2 articulation appears intact. Skull base and vertebrae: Skullbase appears intact. No vertebral compression deformities. No focal bone lesion or bone destruction. Soft tissues and spinal canal: No prevertebral soft tissue swelling. No paraspinal mass or soft tissue infiltration. Disc levels: Degenerative changes throughout the cervical spine with narrowed interspaces and endplate hypertrophic changes throughout. Degenerative changes in the facet joints. Upper chest: Lung apices are clear. Cervical carotid vascular calcifications. Other: None. IMPRESSION: 1. No acute intracranial abnormalities. Chronic atrophy and small vessel ischemic changes. 2. Nonspecific straightening of usual cervical lordosis. Degenerative changes in the cervical spine. No acute displaced fractures identified. Electronically Signed   By: Lucienne Capers M.D.   On: 04/21/2017 02:21   Ct Cervical Spine Wo Contrast  Result Date: 04/21/2017 CLINICAL DATA:  Two falls today.  No loss of consciousness. EXAM: CT HEAD WITHOUT CONTRAST CT CERVICAL SPINE WITHOUT CONTRAST TECHNIQUE: Multidetector CT imaging of the head and cervical spine was performed following the standard protocol without intravenous contrast. Multiplanar CT image reconstructions of the cervical spine were also generated. COMPARISON:  None. FINDINGS: CT HEAD FINDINGS Brain: Diffuse cerebral atrophy. Mild ventricular  dilatation consistent with central atrophy. Low-attenuation changes in the deep white matter consistent with small vessel ischemia. No mass-effect or midline shift. No abnormal extra-axial fluid collections. Gray-white matter junctions are distinct. Basal cisterns are not effaced. No acute intracranial hemorrhage. Vascular: Intracranial arterial vascular calcifications are present. Skull: Calvarium appears intact. Sinuses/Orbits: Paranasal sinuses and mastoid air cells are not opacified. Other: None. CT CERVICAL SPINE FINDINGS Alignment: Straightening of usual cervical lordosis without anterior subluxation. This is likely due to patient positioning but ligamentous injury or muscle spasm could also have this appearance and are not excluded. Normal alignment of the facet joints. C1-2 articulation appears intact. Skull base and vertebrae: Skullbase appears intact. No vertebral compression deformities. No focal bone lesion  or bone destruction. Soft tissues and spinal canal: No prevertebral soft tissue swelling. No paraspinal mass or soft tissue infiltration. Disc levels: Degenerative changes throughout the cervical spine with narrowed interspaces and endplate hypertrophic changes throughout. Degenerative changes in the facet joints. Upper chest: Lung apices are clear. Cervical carotid vascular calcifications. Other: None. IMPRESSION: 1. No acute intracranial abnormalities. Chronic atrophy and small vessel ischemic changes. 2. Nonspecific straightening of usual cervical lordosis. Degenerative changes in the cervical spine. No acute displaced fractures identified. Electronically Signed   By: Lucienne Capers M.D.   On: 04/21/2017 02:21   Ct Abdomen Pelvis W Contrast  Result Date: 04/21/2017 CLINICAL DATA:  Blunt abdominal trauma.  Fall today.  Back pain. EXAM: CT ABDOMEN AND PELVIS WITH CONTRAST TECHNIQUE: Multidetector CT imaging of the abdomen and pelvis was performed using the standard protocol following bolus  administration of intravenous contrast. CONTRAST:  123mL ISOVUE-300 IOPAMIDOL (ISOVUE-300) INJECTION 61% COMPARISON:  CT 09/27/2010 FINDINGS: Lower chest: No pleural fluid or consolidation. Left basilar atelectasis or scarring. Coronary artery calcifications. Hepatobiliary: No hepatic injury or perihepatic hematoma. Gallbladder is unremarkable Pancreas: No pancreatic injury. No ductal dilatation or inflammation. Spleen: No splenic injury or perisplenic hematoma. Adrenals/Urinary Tract: No adrenal hemorrhage or renal injury identified. Multiple bilateral renal cysts, largest in the upper left kidney is bilobed measuring 7.2 cm. Bladder is unremarkable. Stomach/Bowel: No evidence of bowel or mesenteric injury. No bowel wall thickening. Extensive distal colonic diverticulosis. Moderate colonic stool burden. No mesenteric hematoma. No free air. Post appendectomy per history. Vascular/Lymphatic: No vascular injury. Dense aortic atherosclerosis without aneurysm. No retroperitoneal fluid. No adenopathy. Reproductive: Uterus and bilateral adnexa are unremarkable. Other: Mild stranding of the subcutaneous fat posterior to the left hip. No free air or free fluid. Small fat containing umbilical hernia. Musculoskeletal: Mild L1 inferior endplate convexity, new from prior. Scoliosis with diffuse degenerative disc disease and facet arthropathy, mildly progressed. Bony pelvis is intact without acute fracture. IMPRESSION: 1. No acute intra-abdominal/pelvic traumatic at matter injury. 2. Stranding posterior to the left hip may reflect subcutaneous soft tissue contusion. 3. Cconcavity of L1 inferior endplate, may reflect mild compression fracture, age indeterminate versus developing Schmorl's node. 4.  Aortic Atherosclerosis (ICD10-I70.0).  Colonic diverticulosis. Electronically Signed   By: Jeb Levering M.D.   On: 04/21/2017 02:29    Procedures .Splint Application Date/Time: 53/66/4403 7:59 AM Performed by: Delora Fuel,  MD Authorized by: Delora Fuel, MD   Consent:    Consent obtained:  Verbal   Consent given by:  Patient   Risks discussed:  Numbness, pain and swelling   Alternatives discussed:  No treatment Pre-procedure details:    Sensation:  Normal Procedure details:    Laterality:  Left   Location:  Wrist   Wrist:  L wrist   Splint type:  Sugar tong   Supplies:  Cotton padding, elastic bandage and Ortho-Glass Post-procedure details:    Pain:  Improved   Sensation:  Normal   Skin color:  Normal   Patient tolerance of procedure:  Tolerated well, no immediate complications Comments:     Splint applied by orthopedic technician, neurovascular status rechecked by me following splint application.   (including critical care time)  Medications Ordered in ED Medications  iopamidol (ISOVUE-300) 61 % injection (not administered)  iopamidol (ISOVUE-300) 61 % injection 100 mL (100 mLs Intravenous Contrast Given 04/21/17 0150)     Initial Impression / Assessment and Plan / ED Course  I have reviewed the triage vital signs and the nursing  notes.  Pertinent labs & imaging results that were available during my care of the patient were reviewed by me and considered in my medical decision making (see chart for details).  Fall with complaints of back pain, no spinal tenderness on my exam, but tenderness in the abdomen and weakness of her legs.  She will be sent for CT of abdomen and pelvis which will also evaluate the lumbar spine.  We will also get CT of head and cervical spine and plain x-rays of the right lower leg.  Screening labs are obtained.  Old records are reviewed, and she has no relevant past visits.  X-rays are negative.  CT scan does show some evidence of contusion around the left hip.  This does correlate with where she is complaining of pain.  Attempts to ambulate her family she was able to bear weight, but she required 2 people to support her.  She lives alone and this is not a tenable  situation at home.  In the meantime, she started complaining of pain in her left wrist and it is noted that this has shown some swelling that was not present previously.  She is sent for x-rays of the left wrist which do show a nondisplaced fracture of the ulnar styloid.  She is placed in a wrist splint.  Family was concerned about her degree of weakness.  For completeness, urinalysis was obtained and CK which showed no evidence of urinary tract infection and no evidence of rhabdomyolysis.  Social service has been consulted to try to make appropriate placement arrangements for her. Case is signed out to Dr. Dallas Breeding.  Final Clinical Impressions(s) / ED Diagnoses   Final diagnoses:  Fall, initial encounter  Contusion of left hip, initial encounter  Difficulty walking  Closed nondisplaced fracture of styloid process of left ulna, initial encounter    ED Discharge Orders    None       Delora Fuel, MD 44/81/85 [phone]

## 2017-04-20 NOTE — ED Notes (Signed)
Pt walked from hall to bed.

## 2017-04-21 ENCOUNTER — Emergency Department (HOSPITAL_COMMUNITY): Payer: Medicare Other

## 2017-04-21 ENCOUNTER — Encounter (HOSPITAL_COMMUNITY): Payer: Self-pay | Admitting: Radiology

## 2017-04-21 LAB — CBC WITH DIFFERENTIAL/PLATELET
BASOS PCT: 0 %
Basophils Absolute: 0 10*3/uL (ref 0.0–0.1)
Eosinophils Absolute: 0.1 10*3/uL (ref 0.0–0.7)
Eosinophils Relative: 1 %
HEMATOCRIT: 38.9 % (ref 36.0–46.0)
HEMOGLOBIN: 12.8 g/dL (ref 12.0–15.0)
Lymphocytes Relative: 13 %
Lymphs Abs: 0.9 10*3/uL (ref 0.7–4.0)
MCH: 29.2 pg (ref 26.0–34.0)
MCHC: 32.9 g/dL (ref 30.0–36.0)
MCV: 88.6 fL (ref 78.0–100.0)
MONOS PCT: 9 %
Monocytes Absolute: 0.6 10*3/uL (ref 0.1–1.0)
NEUTROS ABS: 5.1 10*3/uL (ref 1.7–7.7)
NEUTROS PCT: 77 %
Platelets: 183 10*3/uL (ref 150–400)
RBC: 4.39 MIL/uL (ref 3.87–5.11)
RDW: 16.7 % — ABNORMAL HIGH (ref 11.5–15.5)
WBC: 6.8 10*3/uL (ref 4.0–10.5)

## 2017-04-21 LAB — COMPREHENSIVE METABOLIC PANEL
ALT: 17 U/L (ref 14–54)
ANION GAP: 9 (ref 5–15)
AST: 31 U/L (ref 15–41)
Albumin: 4 g/dL (ref 3.5–5.0)
Alkaline Phosphatase: 49 U/L (ref 38–126)
BILIRUBIN TOTAL: 0.5 mg/dL (ref 0.3–1.2)
BUN: 14 mg/dL (ref 6–20)
CO2: 24 mmol/L (ref 22–32)
Calcium: 10 mg/dL (ref 8.9–10.3)
Chloride: 104 mmol/L (ref 101–111)
Creatinine, Ser: 0.58 mg/dL (ref 0.44–1.00)
GFR calc Af Amer: >60 mL/min (ref >60)
GFR calc non Af Amer: >60 mL/min (ref >60)
GLUCOSE: 163 mg/dL — AB (ref 65–99)
POTASSIUM: 4 mmol/L (ref 3.5–5.1)
SODIUM: 137 mmol/L (ref 135–145)
TOTAL PROTEIN: 7 g/dL (ref 6.5–8.1)

## 2017-04-21 LAB — CK: Total CK: 31 U/L — ABNORMAL LOW (ref 38–234)

## 2017-04-21 LAB — URINALYSIS, ROUTINE W REFLEX MICROSCOPIC
BACTERIA UA: NONE SEEN
Bilirubin Urine: NEGATIVE
Glucose, UA: NEGATIVE mg/dL
HGB URINE DIPSTICK: NEGATIVE
Ketones, ur: NEGATIVE mg/dL
NITRITE: NEGATIVE
Protein, ur: NEGATIVE mg/dL
SPECIFIC GRAVITY, URINE: 1.043 — AB (ref 1.005–1.030)
Squamous Epithelial / LPF: NONE SEEN
pH: 5 (ref 5.0–8.0)

## 2017-04-21 LAB — LIPASE, BLOOD: Lipase: 27 U/L (ref 11–51)

## 2017-04-21 MED ORDER — ROSUVASTATIN CALCIUM 10 MG PO TABS
10.0000 mg | ORAL_TABLET | Freq: Every day | ORAL | Status: DC
  Administered 2017-04-21: 10 mg via ORAL
  Filled 2017-04-21 (×2): qty 1

## 2017-04-21 MED ORDER — CYCLOSPORINE 0.05 % OP EMUL
1.0000 [drp] | Freq: Two times a day (BID) | OPHTHALMIC | Status: DC
  Administered 2017-04-21 – 2017-04-22 (×2): 1 [drp] via OPHTHALMIC
  Filled 2017-04-21 (×4): qty 1

## 2017-04-21 MED ORDER — METFORMIN HCL 500 MG PO TABS
1000.0000 mg | ORAL_TABLET | Freq: Two times a day (BID) | ORAL | Status: DC
  Administered 2017-04-21 – 2017-04-22 (×3): 1000 mg via ORAL
  Filled 2017-04-21 (×3): qty 2

## 2017-04-21 MED ORDER — MULTIVITAMINS PO CAPS: 1.0000 | ORAL_CAPSULE | Freq: Every day | ORAL | Status: DC

## 2017-04-21 MED ORDER — ADULT MULTIVITAMIN W/MINERALS CH
1.0000 | ORAL_TABLET | Freq: Every day | ORAL | Status: DC
  Administered 2017-04-21 – 2017-04-22 (×2): 1 via ORAL
  Filled 2017-04-21 (×2): qty 1

## 2017-04-21 MED ORDER — LISINOPRIL 10 MG PO TABS
10.0000 mg | ORAL_TABLET | Freq: Every day | ORAL | Status: DC
  Administered 2017-04-22: 10 mg via ORAL
  Filled 2017-04-21: qty 1

## 2017-04-21 MED ORDER — ACETAMINOPHEN 500 MG PO TABS: 1000.0000 mg | ORAL_TABLET | Freq: Three times a day (TID) | ORAL | Status: DC | PRN

## 2017-04-21 MED ORDER — IOPAMIDOL (ISOVUE-300) INJECTION 61%
INTRAVENOUS | Status: AC
  Administered 2017-04-22: 16:00:00
  Filled 2017-04-21: qty 100

## 2017-04-21 MED ORDER — DORZOLAMIDE HCL-TIMOLOL MAL 2-0.5 % OP SOLN
1.0000 [drp] | Freq: Two times a day (BID) | OPHTHALMIC | Status: DC
  Administered 2017-04-21 – 2017-04-22 (×2): 1 [drp] via OPHTHALMIC
  Filled 2017-04-21: qty 10

## 2017-04-21 MED ORDER — LATANOPROST 0.005 % OP SOLN
1.0000 [drp] | Freq: Every day | OPHTHALMIC | Status: DC
  Filled 2017-04-21: qty 2.5

## 2017-04-21 MED ORDER — ACARBOSE 25 MG PO TABS
25.0000 mg | ORAL_TABLET | Freq: Every evening | ORAL | Status: DC
  Administered 2017-04-21: 25 mg via ORAL
  Filled 2017-04-21 (×2): qty 1

## 2017-04-21 MED ORDER — PIOGLITAZONE HCL 45 MG PO TABS
45.0000 mg | ORAL_TABLET | Freq: Every day | ORAL | Status: DC
  Administered 2017-04-21 – 2017-04-22 (×2): 45 mg via ORAL
  Filled 2017-04-21 (×2): qty 1

## 2017-04-21 MED ORDER — IOPAMIDOL (ISOVUE-300) INJECTION 61%
100.0000 mL | Freq: Once | INTRAVENOUS | Status: AC | PRN
  Administered 2017-04-21: 100 mL via INTRAVENOUS

## 2017-04-21 MED ORDER — ARTIFICIAL TEARS OPHTHALMIC OINT
1.0000 "application " | TOPICAL_OINTMENT | OPHTHALMIC | Status: DC | PRN
  Administered 2017-04-21: 1 via OPHTHALMIC
  Filled 2017-04-21 (×2): qty 3.5

## 2017-04-21 NOTE — Clinical Social Work Note (Signed)
SW attempted entry into [place] Must system but system still down.  Patient will remain in ED until system is back up and pasaar is obtained.  Bed is secure at [facility] once passar is complete.

## 2017-04-21 NOTE — ED Notes (Signed)
Our social worker is here and is speaking with Dr. Leonette Monarch.

## 2017-04-21 NOTE — ED Notes (Signed)
We are awaiting the arrival of our ortho. Tech. And for social work consult. Pt. Remains in no distress and her neighbor remains with her at all times and is quite attentive.

## 2017-04-21 NOTE — Clinical Social Work Note (Signed)
SW checked [place] must system and it is still down.  Patient will remain in ED until system is up and pasaar can be obtained.  Patient does have a bed at [facility] skilled nursing for rehab once passar is obtained.

## 2017-04-21 NOTE — NC FL2 (Addendum)
[place] LEVEL OF CARE SCREENING TOOL     IDENTIFICATION  Patient Name: Jennifer Gordon Birthdate: 1922/05/04 Sex: female Admission Date (Current Location): 04/20/2017  [facility] and [place] Number:  Herbalist and Address:  [facility],  [address] [place], [place]      Provider Number: [id number]  Attending Physician Name and Address:  Fatima Blank, *  Relative Name and Phone Number:  kayanna mckillop [phone]    Current Level of Care: Hospital Recommended Level of Care: [place] Prior Approval Number:    Date Approved/Denied:   PASRR Number:   [id number]   Discharge Plan: SNF    Current Diagnoses: Patient Active Problem List   Diagnosis Date Noted  . Coronary atherosclerosis of native coronary artery 08/06/2013  . Edema 08/06/2013  . Hypertension   . High cholesterol   . S/P angioplasty with stent   . Skin cancer of nose     Orientation RESPIRATION BLADDER Height & Weight     Self, Time, Situation, Place  Normal Continent Weight: 135 lb (61.2 kg) Height:  5\' 2"  (157.5 cm)  BEHAVIORAL SYMPTOMS/MOOD NEUROLOGICAL BOWEL NUTRITION STATUS      Continent    AMBULATORY STATUS COMMUNICATION OF NEEDS Skin   Extensive Assist Verbally Normal                       Personal Care Assistance Level of Assistance  Bathing, Dressing Bathing Assistance: Maximum assistance   Dressing Assistance: Maximum assistance     Functional Limitations Info  Hearing   Hearing Info: Impaired      SPECIAL CARE FACTORS FREQUENCY  PT (By licensed PT)     PT Frequency: 5 times week              Contractures      Additional Factors Info  Allergies   Allergies Info: Codeine, Lipitor Atorvastatin Calcium, Niaspan Niacin Er, Ticlid Ticlopidine Hcl, Zocor Simvastatin - High Dose           Current Medications (04/21/2017):  This is the current hospital active medication list Current  Facility-Administered Medications  Medication Dose Route Frequency Provider Last Rate Last Dose  . iopamidol (ISOVUE-300) 61 % injection            Current Outpatient Medications  Medication Sig Dispense Refill  . acarbose (PRECOSE) 25 MG tablet Take 25 mg by mouth every evening.     . Artificial Tear Ointment (ARTIFICIAL TEARS) ointment Place 1 drop into both eyes as needed (AS DIRECTED FOR DRY EYES).     Marland Kitchen aspirin 81 MG tablet Take 81 mg by mouth daily.    . cycloSPORINE (RESTASIS) 0.05 % ophthalmic emulsion Place 1 drop into both eyes 2 (two) times daily. Use for dry eyes.    . dorzolamide-timolol (COSOPT) 22.3-6.8 MG/ML ophthalmic solution Place 1 drop into both eyes 2 (two) times daily. Use for glaucoma    . Eyelid Cleansers (OCUSOFT LID SCRUB EX) Apply topically 2 (two) times daily. As directed    . latanoprost (XALATAN) 0.005 % ophthalmic solution Place 1 drop into both eyes at bedtime.     Marland Kitchen lisinopril (PRINIVIL,ZESTRIL) 10 MG tablet Take 10 mg by mouth daily.    . metFORMIN (GLUCOPHAGE) 1000 MG tablet Take 1,000 mg by mouth 2 (two) times daily with a meal.    . Multiple Vitamin (MULTIVITAMIN) capsule Take 1 capsule by mouth daily. Patient takes [employer]  Silver A-Z    . pioglitazone (ACTOS) 45 MG tablet Take 45 mg by mouth daily.    . rosuvastatin (CRESTOR) 10 MG tablet Take 10 mg by mouth daily.       Discharge Medications: Please see discharge summary for a list of discharge medications.  Relevant Imaging Results:  Relevant Lab Results:   Additional Information ss number [id number]  Carlean Jews, LCSW

## 2017-04-21 NOTE — ED Provider Notes (Signed)
  Physical Exam  BP 110/61   Pulse 80   Temp 97.7 F (36.5 C) (Oral)   Resp 16   Ht 5\' 2"  (1.575 m)   Wt 61.2 kg (135 lb)   SpO2 93%   BMI 24.69 kg/m   Physical Exam  ED Course  Procedures  MDM I assumed care of this patient from Dr. Roxanne Mins at [id number].  Please see their note for further details of Hx, PE.  Briefly patient is a 83 y.o. female who presented with back pain after a fall.  Workup revealed left hip contusion.  Also revealed nondisplaced left ulnar fracture that was splinted.  Patient unable to ambulate and lives at home alone so social worker was consulted for rehab placement.  Patient did qualify for rehab and is currently awaiting paperwork for acceptance at [facility], MD 04/21/17 [phone]

## 2017-04-21 NOTE — Clinical Social Work Note (Signed)
SW met with patient and provided information regarding rehab bed at [facility].  SW still attempting to gain entry into [place] [facility] system but system continues to be down.  Patient was provided with information that she has a bed at [facility] once we obtain her pasaar number.  Patient's neighbor is going to her home to get her glaucoma medication drops so that she can take her required medications.  MD provided with the rehab information and FL2 has been signed.  Patient's son is her 45 and he is in [place].  His information is Jacalyn Lefevre [phone].

## 2017-04-21 NOTE — Clinical Social Work Note (Signed)
SW met with patient to provide information regarding skilled rehab nursing bed.  Pt is open to rehab bed and reported that she has no history of rehab.  SW attempted to gain entry into [place] must system to obtain pasaar but system is currently down for maintenance.  SW spoke with Abigail Butts at [facility] who stated she had private rehab bed for patient today if pasaar can be obtained.  SW entered pt info into hub and sent to [facility] who will also try and gain entry into system.  FL 2 created and awaiting MD signature.

## 2017-04-21 NOTE — Clinical Social Work Note (Signed)
Clinical Social Work Assessment  Patient Details  Name: Jennifer Gordon MRN: [id number] Date of Birth: Dec 02, 1921  Date of referral:  04/21/17               Reason for consult:  Facility Placement                Permission sought to share information with:  Facility Art therapist granted to share information::  Yes, Verbal Permission Granted  Name::     sone is Optician, dispensing  Agency::     Relationship::     Contact Information:     Housing/Transportation Living arrangements for the past 2 months:  [place] of Information:  Patient Patient Interpreter Needed:  None Criminal Activity/Legal Involvement Pertinent to Current Situation/Hospitalization:  No - Comment as needed Significant Relationships:  Adult Children Lives with:  Self Do you feel safe going back to the place where you live?  No Need for family participation in patient care:  Yes (Comment)(friend Jennifer Gordon [phone])  Care giving concerns:  None reported   Facilities manager / plan:  Pt was faxed out to [facility] and accepted but [place] must down. Needs pasaar  Employment status:  Retired Nurse, adult PT Recommendations:  Not assessed at this time Information / Referral to community resources:     Patient/Family's Response to care:  No family in town.  Son in Jennifer Gordon is Jennifer Gordon [id number] [phone]  Patient/Family's Understanding of and Emotional Response to Diagnosis, Current Treatment, and Prognosis:  Pt very cooperative and no history of rehab  Emotional Assessment Appearance:  Appears younger than stated age Attitude/Demeanor/Rapport:  (cooperative) Affect (typically observed):  Accepting Orientation:  Oriented to Self, Oriented to Place, Oriented to  Time, Oriented to Situation Alcohol / Substance use:    Psych involvement (Current and /or in the community):  No (Comment)  Discharge Needs  Concerns to be  addressed:    Readmission within the last 30 days:  No Current discharge risk:    Barriers to Discharge:  No Barriers Identified   Carlean Jews, LCSW 04/21/2017, 4:50 PM

## 2017-04-21 NOTE — ED Notes (Signed)
Our ortho. Tech. Has just applied a sugar-tongue splint; and pt. Remains in no distress. We are awaiting final work as to placement of pt. In SNF vs. Rehab.

## 2017-04-21 NOTE — ED Notes (Signed)
Patient refusing night medications stating, "I'm already asleep."

## 2017-04-21 NOTE — ED Notes (Signed)
Dr. Roxanne Mins made aware that patient complaining of left wrist pain

## 2017-04-22 MED ORDER — ACETAMINOPHEN 500 MG PO TABS: 500.0000 mg | ORAL_TABLET | ORAL | 0 refills | Status: AC | PRN

## 2017-04-22 NOTE — Evaluation (Addendum)
Physical Therapy Evaluation Patient Details Name: Jennifer Gordon MRN: [id number] DOB: 25-May-1922 Today's Date: 04/22/2017   History of Present Illness  83 y.o. female admitted with 2 falls, sustained L radial styloid process fracture, L1 compression fracture. PMH of DM.  Clinical Impression  Pt admitted with above diagnosis. Pt currently with functional limitations due to the deficits listed below (see PT Problem List). Mod assist for bed mobility, min A to pivot to chair, pt unable to ambulate. She lives alone, doesn't have assistance available at home, will need ST-SNF. She walked independently with a cane prior to her 2 falls yesterday.  Pt will benefit from skilled PT to increase their independence and safety with mobility to allow discharge to the venue listed below.       Follow Up Recommendations SNF; assistance for mobility    Equipment Recommendations  Wheelchair (measurements PT);Wheelchair cushion (measurements PT)    Recommendations for Other Services       Precautions / Restrictions Precautions Precautions: Fall Precaution Comments: 2 falls on day of admission ("my knees buckled"), pt denies prior falls this year Restrictions Weight Bearing Restrictions: Yes LUE Weight Bearing: Non weight bearing      Mobility  Bed Mobility Overal bed mobility: Needs Assistance Bed Mobility: Supine to Sit     Supine to sit: Mod assist     General bed mobility comments: assist to raise trunk  Transfers Overall transfer level: Needs assistance Equipment used: Straight cane Transfers: Sit to/from Stand;Stand Pivot Transfers Sit to Stand: Min assist Stand pivot transfers: Min assist       General transfer comment: assist to rise and to steady with pivot  Ambulation/Gait             General Gait Details: unable due to pain and weakness, pt stated she felt like her legs were going to buckle  Stairs            Wheelchair Mobility    Modified Rankin  (Stroke Patients Only)       Balance Overall balance assessment: Needs assistance   Sitting balance-Leahy Scale: Good     Standing balance support: Single extremity supported Standing balance-Leahy Scale: Poor Standing balance comment: requires single UE support                             Pertinent Vitals/Pain Pain Assessment: 0-10 Pain Score: 4  Pain Location: low back Pain Descriptors / Indicators: Sore Pain Intervention(s): Limited activity within patient's tolerance;Monitored during session;Repositioned    Home Living Family/patient expects to be discharged to:: Private residence Living Arrangements: Alone Available Help at Discharge: Other (Comment)(none ("my son lives in [place] and can't help me"))   Home Access: Level entry     Home Layout: One level Home Equipment: Cane - single point      Prior Function Level of Independence: Independent with assistive device(s)         Comments: independent with bathing/dressing, walks with SPC, drives     Hand Dominance        Extremity/Trunk Assessment   Upper Extremity Assessment Upper Extremity Assessment: LUE deficits/detail LUE Deficits / Details: LUE in sling 2* radial fx LUE: Unable to fully assess due to immobilization    Lower Extremity Assessment Lower Extremity Assessment: Overall WFL for tasks assessed    Cervical / Trunk Assessment Cervical / Trunk Assessment: Normal  Communication   Communication: No difficulties  Cognition Arousal/Alertness: Awake/alert Behavior During Therapy:  WFL for tasks assessed/performed Overall Cognitive Status: Within Functional Limits for tasks assessed                                        General Comments      Exercises     Assessment/Plan    PT Assessment Patient needs continued PT services  PT Problem List Decreased activity tolerance;Decreased mobility;Pain;Decreased balance       PT Treatment Interventions Gait  training    PT Goals (Current goals can be found in the Care Plan section)  Acute Rehab PT Goals Patient Stated Goal: DC to [facility] PT Goal Formulation: With patient Time For Goal Achievement: 05/06/17 Potential to Achieve Goals: Good    Frequency Min 3X/week   Barriers to discharge Decreased caregiver support      Co-evaluation               AM-PAC PT "6 Clicks" Daily Activity  Outcome Measure Difficulty turning over in bed (including adjusting bedclothes, sheets and blankets)?: Unable Difficulty moving from lying on back to sitting on the side of the bed? : Unable Difficulty sitting down on and standing up from a chair with arms (e.g., wheelchair, bedside commode, etc,.)?: Unable Help needed moving to and from a bed to chair (including a wheelchair)?: A Lot Help needed walking in hospital room?: Total Help needed climbing 3-5 steps with a railing? : Total 6 Click Score: 7    End of Session Equipment Utilized During Treatment: Gait belt Activity Tolerance: Patient limited by fatigue;Patient limited by pain Patient left: in chair;with call bell/phone within reach Nurse Communication: Mobility status PT Visit Diagnosis: Unsteadiness on feet (R26.81);Repeated falls (R29.6)    Time: 8416-6063 PT Time Calculation (min) (ACUTE ONLY): 16 min   Charges:   PT Evaluation $PT Eval Low Complexity: 1 Low     PT G Codes:   PT G-Codes **NOT FOR INPATIENT CLASS** Functional Assessment Tool Used: AM-PAC 6 Clicks Basic Mobility Functional Limitation: Mobility: Walking and moving around Mobility: Walking and Moving Around Current Status (K1601): At least 80 percent but less than 100 percent impaired, limited or restricted Mobility: Walking and Moving Around Goal Status [phone]): At least 40 percent but less than 60 percent impaired, limited or restricted      Philomena Doheny 04/22/2017, 11:38 AM [phone]

## 2017-04-22 NOTE — ED Notes (Signed)
Called report to [place] talked to [employer]. Waiting for [facility] to transport.

## 2017-04-22 NOTE — ED Notes (Signed)
Pt reports 5/10 back pain, was offered tylenol for pain but declined. Pt made aware to not hesitate to inform this RN if she would like PRN tylenol for pain.

## 2017-04-22 NOTE — Progress Notes (Addendum)
Update: CSW has received [place] pasrr number- [id number]. Patient able to discharge to [facility] today 11/25. Patients son is completing paperwork at facility at 4:30PM. Patient is able to be transported at 5:00PM to facility. Please call report to [phone].   CSW aware of patient needing placement. Patient has been accepted to [facility] and is waiting for PASRR number for acceptance. As of 9:30AM, [place] Must still down and unable to obtain PASRR number at this time. Will continue to access [facility].   Kingsley Spittle, [facility] Emergency Room Clinical Social Worker [phone]

## 2017-04-22 NOTE — ED Provider Notes (Signed)
10:15 AM Assumed care from Drs. Cardama and Roxanne Mins, please see their notes for full history, physical and decision making until this point. In brief this is a 83 y.o. year old female who presented to the ED yesterday with Fall and Back Pain     He was found to have a fracture in her back and have her ulna.  Splint and splint.  She had difficulty ambulating and thus it was felt unsafe to send her home so we are seeking placement in a rehabilitation facility.  On my evaluation today patient is without complaints.  Appears well.  Understands the plan.  Discussed with social work and will put in a physical therapy consult to assess her needs to resume work on placement.  Labs, studies and imaging reviewed by myself and considered in medical decision making if ordered. Imaging interpreted by radiology.  Labs Reviewed  COMPREHENSIVE METABOLIC PANEL - Abnormal; Notable for the following components:      Result Value   Glucose, Bld 163 (*)    All other components within normal limits  CBC WITH DIFFERENTIAL/PLATELET - Abnormal; Notable for the following components:   RDW 16.7 (*)    All other components within normal limits  CK - Abnormal; Notable for the following components:   Total CK 31 (*)    All other components within normal limits  URINALYSIS, ROUTINE W REFLEX MICROSCOPIC - Abnormal; Notable for the following components:   Specific Gravity, Urine 1.043 (*)    Leukocytes, UA TRACE (*)    All other components within normal limits  LIPASE, BLOOD    DG Wrist Complete Left  Final Result    CT Abdomen Pelvis W Contrast  Final Result    CT Head Wo Contrast  Final Result    CT Cervical Spine Wo Contrast  Final Result    DG Tibia/Fibula Right  Final Result      No Follow-up on file.    Merrily Pew, MD 04/22/17 1017

## 2017-06-15 ENCOUNTER — Telehealth: Payer: Self-pay | Admitting: Interventional Cardiology

## 2017-06-15 NOTE — Telephone Encounter (Signed)
°  New Prob  Swelling  Pt c/o swelling: STAT is pt has developed SOB within 24 hours  1) How much weight have you gained and in what time span? Unsure  2) If swelling, where is the swelling located? Left leg from foot up to knee  3) Are you currently taking a fluid pill? No  4) Are you currently SOB? No, but tired  5) Do you have a log of your daily weights (if so, list)? No  6) Have you gained 3 pounds in a day or 5 pounds in a week? Unsure  7) Have you traveled recently? No

## 2017-06-15 NOTE — Telephone Encounter (Signed)
Left message for Jennifer Gordon to call back. 

## 2017-06-15 NOTE — Telephone Encounter (Signed)
Attempted to reach out to patient but there was no answer. Left message for patient to call back.

## 2017-06-19 NOTE — Telephone Encounter (Signed)
Left message for patient to call back  

## 2017-06-25 NOTE — Telephone Encounter (Signed)
Called and left message for patient to call back if there were any further questions or concerns.

## 2017-07-04 ENCOUNTER — Encounter: Payer: Self-pay | Admitting: Interventional Cardiology

## 2017-07-11 NOTE — Progress Notes (Signed)
Cardiology Office Note   Date:  07/12/2017   ID:  Jennifer Gordon, DOB [birth year], MRN [id number]  PCP:  Hulan Fess, MD    No chief complaint on file.  CAD  Wt Readings from Last 3 Encounters:  07/12/17 140 lb (63.5 kg)  04/20/17 135 lb (61.2 kg)  11/07/16 146 lb (66.2 kg)       History of Present Illness: Jennifer Gordon is a 84 y.o. female  coronary artery disease including several stents many years ago. She has chronic back and knee pain which limit her walking.   She fell in 11/18 when her kne ebuckled and broke her left wrist.  SHe has recovered.  She still feels tha there knee gives out and this causes the falling.    She is still doing OT and PT.  She was in rehab for 20 days.   Denies : Chest pain. Dizziness. Leg edema. Nitroglycerin use. Orthopnea. Palpitations. Paroxysmal nocturnal dyspnea. Shortness of breath. Syncope.     Past Medical History:  Diagnosis Date  . Diabetes mellitus   . High cholesterol   . Hypertension   . S/P angioplasty with stent    #3 stents placed,   . Skin cancer of nose     Past Surgical History:  Procedure Laterality Date  . APPENDECTOMY    . TONSILLECTOMY       Current Outpatient Medications  Medication Sig Dispense Refill  . acarbose (PRECOSE) 25 MG tablet Take 25 mg by mouth every evening.     Marland Kitchen acetaminophen (TYLENOL) 500 MG tablet Take 1 tablet (500 mg total) by mouth every 4 (four) hours as needed for moderate pain. 30 tablet 0  . Artificial Tear Ointment (ARTIFICIAL TEARS) ointment Place 1 drop into both eyes as needed (AS DIRECTED FOR DRY EYES).     Marland Kitchen aspirin 81 MG tablet Take 81 mg by mouth daily.    . cycloSPORINE (RESTASIS) 0.05 % ophthalmic emulsion Place 1 drop into both eyes 2 (two) times daily. Use for dry eyes.    . dorzolamide-timolol (COSOPT) 22.3-6.8 MG/ML ophthalmic solution Place 1 drop into both eyes 2 (two) times daily. Use for glaucoma    . Eyelid Cleansers (OCUSOFT LID SCRUB EX) Apply topically  2 (two) times daily. As directed    . latanoprost (XALATAN) 0.005 % ophthalmic solution Place 1 drop into both eyes at bedtime.     . metFORMIN (GLUCOPHAGE) 1000 MG tablet Take 1,000 mg by mouth 2 (two) times daily with a meal.    . Multiple Vitamin (MULTIVITAMIN) capsule Take 1 capsule by mouth daily. Patient takes Centrum Silver A-Z    . pioglitazone (ACTOS) 45 MG tablet Take 45 mg by mouth daily.    . rosuvastatin (CRESTOR) 10 MG tablet Take 10 mg by mouth daily.    . TRAMADOL HCL PO Take by mouth.      No current facility-administered medications for this visit.     Allergies:   Codeine; Lipitor [atorvastatin calcium]; Niaspan [niacin er]; Ticlid [ticlopidine hcl]; and Zocor [simvastatin - high dose]    Social History:  The patient  reports that  has never smoked. she has never used smokeless tobacco. She reports that she does not drink alcohol or use drugs.   Family History:  The patient's family history includes Heart attack in her mother; Hypertension in her brother.    ROS:  Please see the history of present illness.   Otherwise, review of systems are positive for  LE edema; fall in 11/18.   All other systems are reviewed and negative.    PHYSICAL EXAM: VS:  BP 138/90 (BP Location: Left Arm, Patient Position: Sitting, Cuff Size: Normal)   Pulse 98   Ht 5\' 2"  (1.575 m)   Wt 140 lb (63.5 kg)   SpO2 96%   BMI 25.61 kg/m  , BMI Body mass index is 25.61 kg/m. GEN: Well nourished, well developed, in no acute distress  HEENT: normal  Neck: no JVD, carotid bruits, or masses Cardiac: RRR; no murmurs, rubs, or gallops,; 1+ pretibial LE edema  Respiratory:  clear to auscultation bilaterally, normal work of breathing GI: soft, nontender, nondistended, + BS MS: no deformity or atrophy  Skin: warm and dry, no rash Neuro:  Strength and sensation are intact Psych: euthymic mood, full affect   EKG:   The ekg ordered in 6/18 demonstrates Normal ECG, septal Q waves, no ST  changes   Recent Labs: 04/20/2017: ALT 17; BUN 14; Creatinine, Ser 0.58; Hemoglobin 12.8; Platelets 183; Potassium 4.0; Sodium 137   Lipid Panel No results found for: CHOL, TRIG, HDL, CHOLHDL, VLDL, LDLCALC, LDLDIRECT   Other studies Reviewed: Additional studies/ records that were reviewed today with results demonstrating: .   ASSESSMENT AND PLAN:  1. CAD: No angina on medical therapy.  Lateral defect on 2011 stress test.  COntinue medical management given age and lack of symptoms.  2. HTN: COntrolled.  BP meds were stopped and BP has been controlled when checked at home.  3. Hyperlipidemia: LDL 56 in 4/18 4. LE edema: elevate legs.     Current medicines are reviewed at length with the patient today.  The patient concerns regarding her medicines were addressed.  The following changes have been made:  No change  Labs/ tests ordered today include:  No orders of the defined types were placed in this encounter.   Recommend 150 minutes/week of aerobic exercise Low fat, low carb, high fiber diet recommended  Disposition:   FU in 1 year   Signed, Larae Grooms, MD  07/12/2017 2:52 PM    [facility] [address], [place], [place]  [address] Phone: [phone]; Fax: [phone]

## 2017-07-12 ENCOUNTER — Encounter: Payer: Self-pay | Admitting: Interventional Cardiology

## 2017-07-12 ENCOUNTER — Ambulatory Visit: Payer: Medicare Other | Admitting: Interventional Cardiology

## 2017-07-12 VITALS — BP 138/90 | HR 98 | Ht 62.0 in | Wt 140.0 lb

## 2017-07-12 DIAGNOSIS — I10 Essential (primary) hypertension: Secondary | ICD-10-CM | POA: Diagnosis not present

## 2017-07-12 DIAGNOSIS — Z95820 Peripheral vascular angioplasty status with implants and grafts: Secondary | ICD-10-CM

## 2017-07-12 DIAGNOSIS — E7800 Pure hypercholesterolemia, unspecified: Secondary | ICD-10-CM

## 2017-07-12 DIAGNOSIS — Z959 Presence of cardiac and vascular implant and graft, unspecified: Secondary | ICD-10-CM

## 2017-07-12 DIAGNOSIS — R609 Edema, unspecified: Secondary | ICD-10-CM | POA: Diagnosis not present

## 2017-07-12 DIAGNOSIS — I2510 Atherosclerotic heart disease of native coronary artery without angina pectoris: Secondary | ICD-10-CM | POA: Diagnosis not present

## 2017-07-12 NOTE — Patient Instructions (Signed)

## 2017-11-05 ENCOUNTER — Telehealth: Payer: Self-pay | Admitting: Interventional Cardiology

## 2017-11-05 NOTE — Telephone Encounter (Signed)
Pt called to report that she had a "dizzy" spell last night and again this morning. She says it is more like being lightheaded  and not the room moving around her. She says the feeling lasted only a few seconds both times. She denies any other symptoms such as chest pain, headache, shortness of breath. She says she has no way of checking her BP or her sugar. While she was on the phone with me, her PMD office, Dr. Hulan Fess, called her back and advised her to come to their walk in clinic. She is going to have a friend drive her. I asked for her to have them forward [place] her visit note. Pt was appreciative of the time I spent on the phone with her.

## 2017-11-05 NOTE — Telephone Encounter (Signed)
STAT if patient feels like he/she is going to faint   1) Are you dizzy now? No  2) Do you feel faint or have you passed out? No  3) Do you have any other symptoms? No  4) Have you checked your HR and BP (record if available)? No Pt was dizzy twice last night and once this morning.

## 2018-07-19 ENCOUNTER — Ambulatory Visit: Payer: Medicare Other | Admitting: Interventional Cardiology

## 2018-07-19 NOTE — Progress Notes (Deleted)
Cardiology Office Note   Date:  07/19/2018   ID:  Jennifer Gordon, Jennifer Gordon 1921/06/27, MRN [id number]  PCP:  Hulan Fess, MD    No chief complaint on file.    Wt Readings from Last 3 Encounters:  07/12/17 140 lb (63.5 kg)  04/20/17 135 lb (61.2 kg)  11/07/16 146 lb (66.2 kg)       History of Present Illness: Jennifer Gordon is a 85 y.o. female   coronary artery disease including several stents many years ago. She has chronic back and knee pain which limit her walking.   She fell in 11/18 when her kne ebuckled and broke her left wrist.  SHe has recovered.  She still feels tha there knee gives out and this causes the falling.       Past Medical History:  Diagnosis Date  . Diabetes mellitus   . High cholesterol   . Hypertension   . S/P angioplasty with stent    #3 stents placed,   . Skin cancer of nose     Past Surgical History:  Procedure Laterality Date  . APPENDECTOMY    . TONSILLECTOMY       Current Outpatient Medications  Medication Sig Dispense Refill  . acarbose (PRECOSE) 25 MG tablet Take 25 mg by mouth every evening.     Marland Kitchen acetaminophen (TYLENOL) 500 MG tablet Take 1 tablet (500 mg total) by mouth every 4 (four) hours as needed for moderate pain. 30 tablet 0  . Artificial Tear Ointment (ARTIFICIAL TEARS) ointment Place 1 drop into both eyes as needed (AS DIRECTED FOR DRY EYES).     Marland Kitchen aspirin 81 MG tablet Take 81 mg by mouth daily.    . cycloSPORINE (RESTASIS) 0.05 % ophthalmic emulsion Place 1 drop into both eyes 2 (two) times daily. Use for dry eyes.    . dorzolamide-timolol (COSOPT) 22.3-6.8 MG/ML ophthalmic solution Place 1 drop into both eyes 2 (two) times daily. Use for glaucoma    . Eyelid Cleansers (OCUSOFT LID SCRUB EX) Apply topically 2 (two) times daily. As directed    . latanoprost (XALATAN) 0.005 % ophthalmic solution Place 1 drop into both eyes at bedtime.     . metFORMIN (GLUCOPHAGE) 1000 MG tablet Take 1,000 mg by mouth 2 (two) times  daily with a meal.    . Multiple Vitamin (MULTIVITAMIN) capsule Take 1 capsule by mouth daily. Patient takes Centrum Silver A-Z    . pioglitazone (ACTOS) 45 MG tablet Take 45 mg by mouth daily.    . rosuvastatin (CRESTOR) 10 MG tablet Take 10 mg by mouth daily.    . TRAMADOL HCL PO Take by mouth.      No current facility-administered medications for this visit.     Allergies:   Codeine; Lipitor [atorvastatin calcium]; Niaspan [niacin er]; Ticlid [ticlopidine hcl]; and Zocor [simvastatin - high dose]    Social History:  The patient  reports that she has never smoked. She has never used smokeless tobacco. She reports that she does not drink alcohol or use drugs.   Family History:  The patient's ***family history includes Heart attack in her mother; Hypertension in her brother.    ROS:  Please see the history of present illness.   Otherwise, review of systems are positive for ***.   All other systems are reviewed and negative.    PHYSICAL EXAM: VS:  There were no vitals taken for this visit. , BMI There is no height or weight on file  to calculate BMI. GEN: Well nourished, well developed, in no acute distress HEENT: normal Neck: no JVD, carotid bruits, or masses Cardiac: ***RRR; no murmurs, rubs, or gallops,no edema  Respiratory:  clear to auscultation bilaterally, normal work of breathing GI: soft, nontender, nondistended, + BS MS: no deformity or atrophy Skin: warm and dry, no rash Neuro:  Strength and sensation are intact Psych: euthymic mood, full affect   EKG:   The ekg ordered today demonstrates ***   Recent Labs: No results found for requested labs within last 8760 hours.   Lipid Panel No results found for: CHOL, TRIG, HDL, CHOLHDL, VLDL, LDLCALC, LDLDIRECT   Other studies Reviewed: Additional studies/ records that were reviewed today with results demonstrating: ***.   ASSESSMENT AND PLAN:  1. CAD:  2. HTN: 3. Hyperlipidemia: 4. LE edema:   Current medicines  are reviewed at length with the patient today.  The patient concerns regarding her medicines were addressed.  The following changes have been made:  No change***  Labs/ tests ordered today include: *** No orders of the defined types were placed in this encounter.   Recommend 150 minutes/week of aerobic exercise Low fat, low carb, high fiber diet recommended  Disposition:   FU in ***   Signed, Larae Grooms, MD  07/19/2018 12:00 PM    [facility] [place], [place], [place]  [address] Phone: [phone]; Fax: [phone]

## 2018-07-22 ENCOUNTER — Encounter: Payer: Self-pay | Admitting: Interventional Cardiology

## 2018-10-22 ENCOUNTER — Telehealth: Payer: Self-pay

## 2018-10-22 NOTE — Telephone Encounter (Signed)
Called pt to set up possible evisit. Pt said she would like to speak with her caretaker then call the office back. She is aware she has an apt for 10/25/2018.

## 2018-10-25 ENCOUNTER — Ambulatory Visit: Payer: Medicare Other | Admitting: Interventional Cardiology

## 2018-11-14 IMAGING — CT CT ABD-PELV W/ CM
2 of 5 series · 16 of 46 positions shown, 18 images · IV contrast (ISOVUE)
Comparison: CT 09/27/2010

CLINICAL DATA: Blunt abdominal trauma.  Fall today.  Back pain.

EXAM:
CT ABDOMEN AND PELVIS WITH CONTRAST
TECHNIQUE: Multidetector CT imaging of the abdomen and pelvis was performed
using the standard protocol following bolus administration of
intravenous contrast.
CONTRAST:  100mL 4FJRQI-7GG IOPAMIDOL (4FJRQI-7GG) INJECTION 61%

[Series 2: abd/pel with · axial · 0.85mm/px · z∈[-391,-61]mm · 13 of 78 slices shown, 15 images]
[im 6/78  soft-tissue]
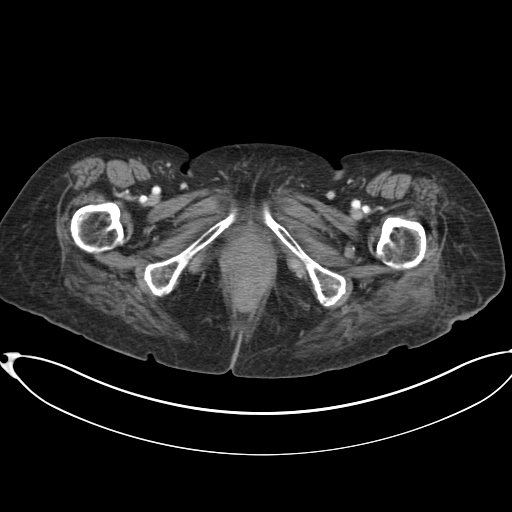
[im 6/78  bone]
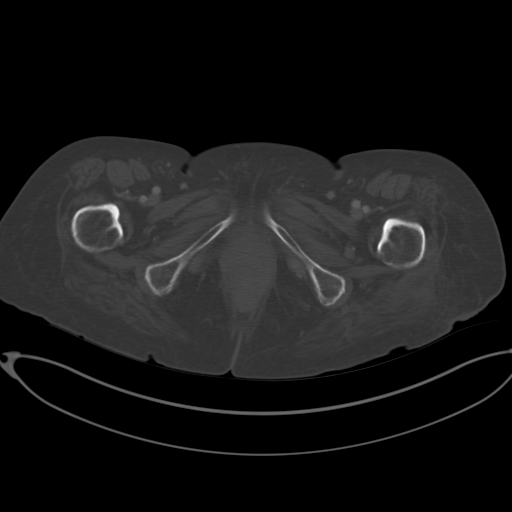
[im 11/78  soft-tissue]
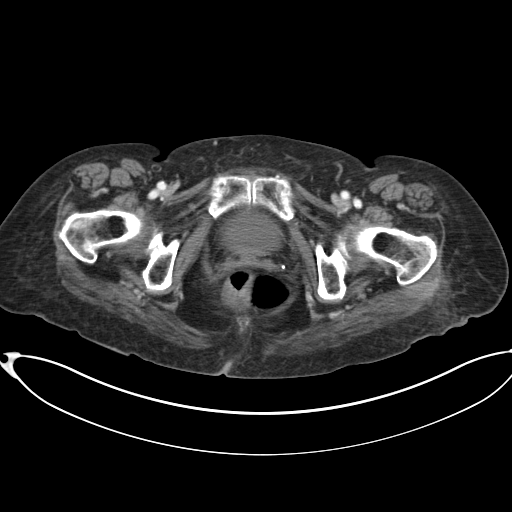
[im 16/78  soft-tissue]
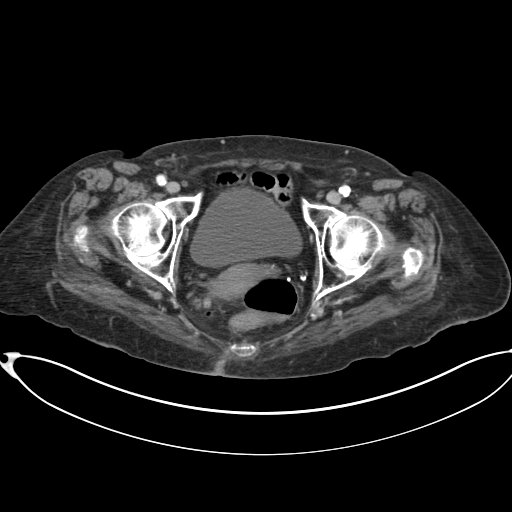
[im 21/78  soft-tissue]
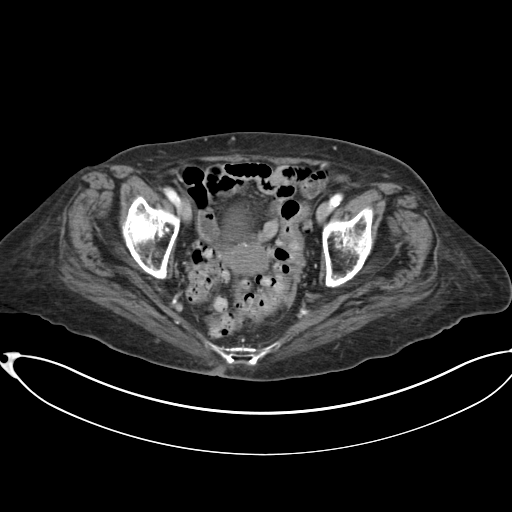
[im 26/78  soft-tissue]
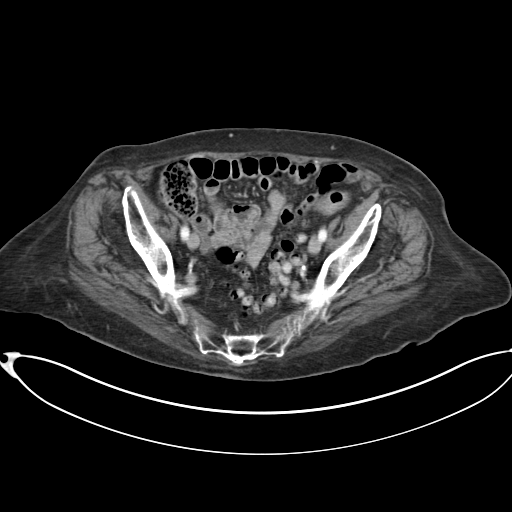
[im 31/78  soft-tissue]
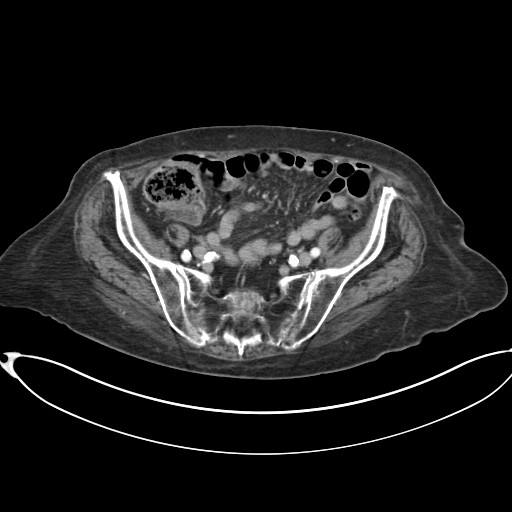
[im 42/78  soft-tissue]
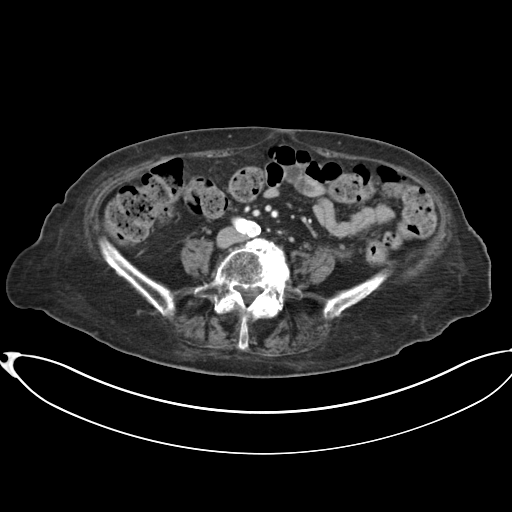
[im 47/78  soft-tissue]
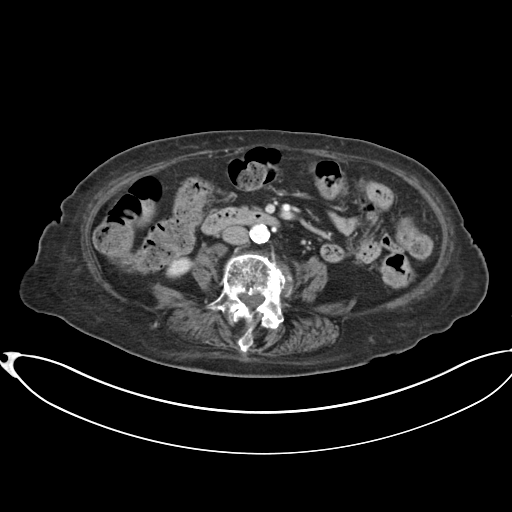
[im 52/78  soft-tissue]
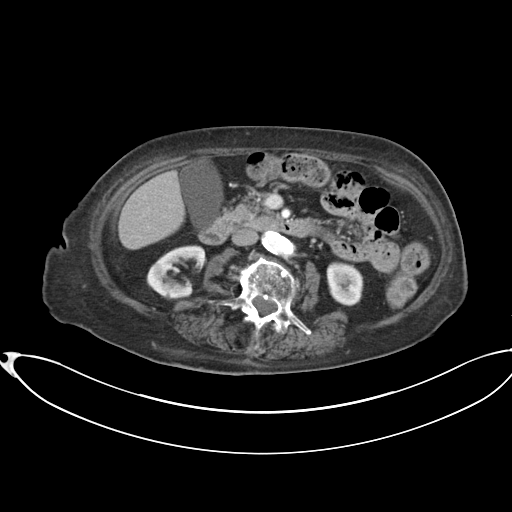
[im 52/78  bone]
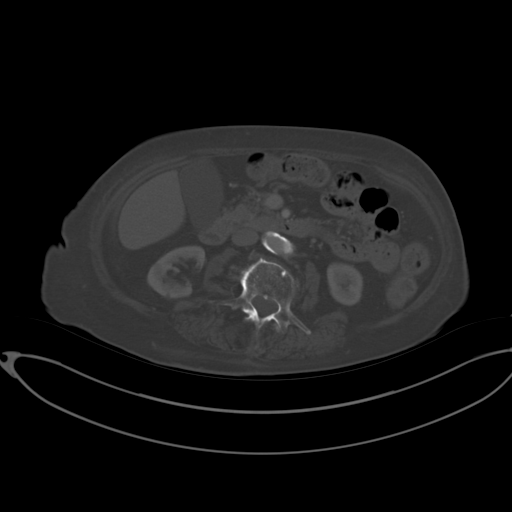
[im 57/78  soft-tissue]
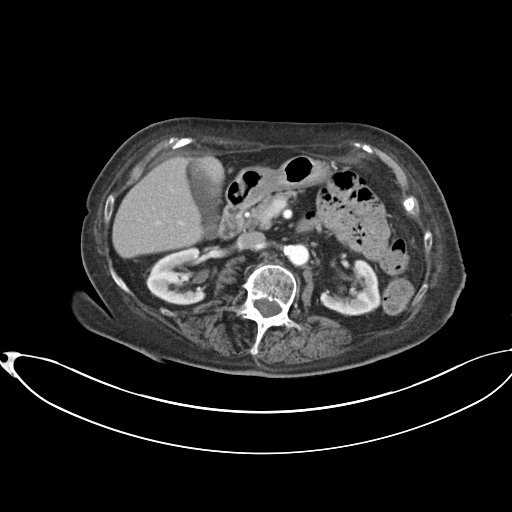
[im 62/78  soft-tissue]
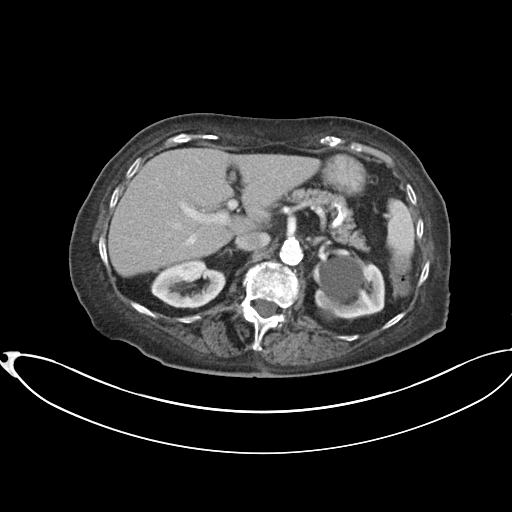
[im 67/78  soft-tissue]
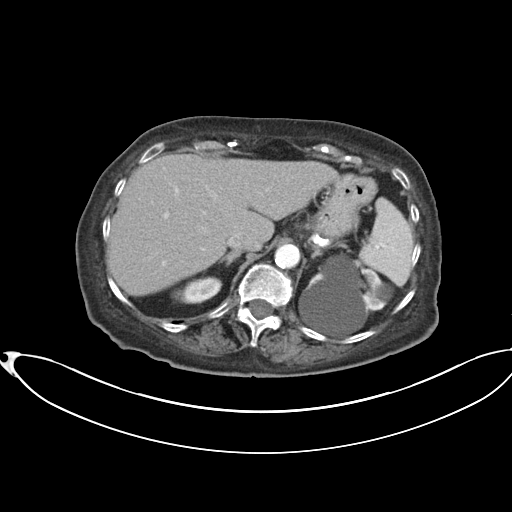
[im 72/78  soft-tissue]
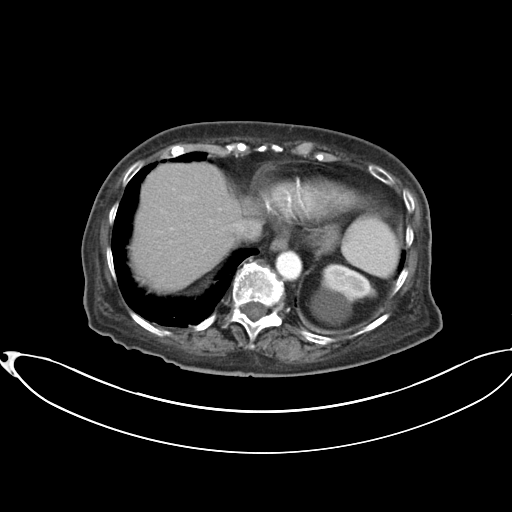

[Series 5: coronal a/|p · coronal · 0.77mm/px · 3 of 109 slices shown]
[im 37/109  soft-tissue]
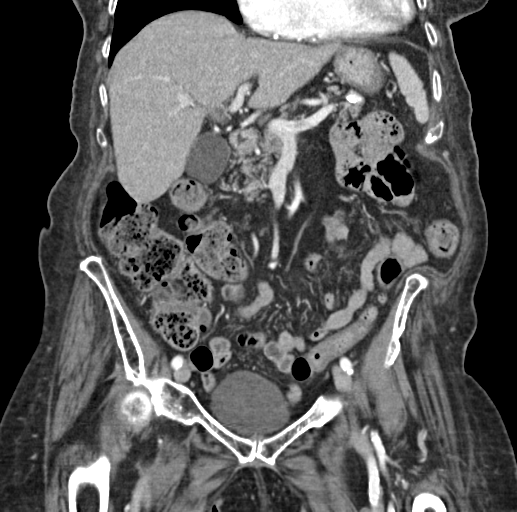
[im 49/109  soft-tissue]
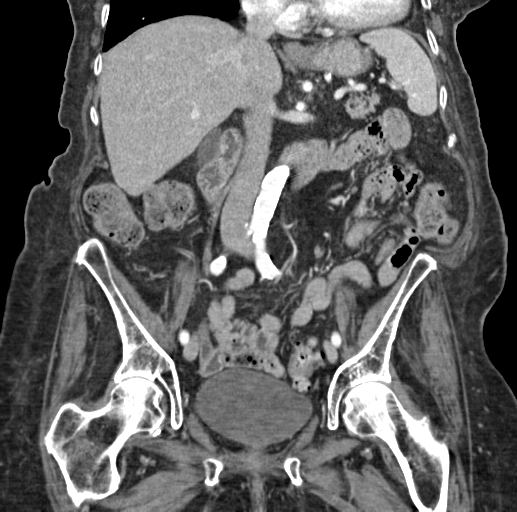
[im 61/109  soft-tissue]
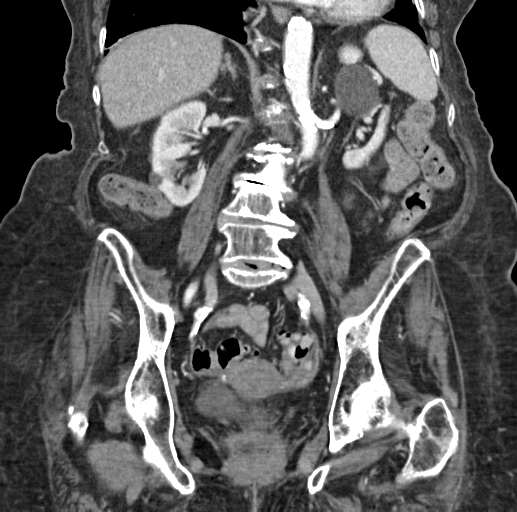

[16 of 46 positions shown; findings below may reference images not displayed]

FINDINGS: Lower chest: No pleural fluid or consolidation. Left basilar
atelectasis or scarring. Coronary artery calcifications.

Hepatobiliary: No hepatic injury or perihepatic hematoma.
Gallbladder is unremarkable

Pancreas: No pancreatic injury. No ductal dilatation or
inflammation.

Spleen: No splenic injury or perisplenic hematoma.

Adrenals/Urinary Tract: No adrenal hemorrhage or renal injury
identified. Multiple bilateral renal cysts, largest in the upper
left kidney is bilobed measuring 7.2 cm. Bladder is unremarkable.

Stomach/Bowel: No evidence of bowel or mesenteric injury. No bowel
wall thickening. Extensive distal colonic diverticulosis. Moderate
colonic stool burden. No mesenteric hematoma. No free air. Post
appendectomy per history.

Vascular/Lymphatic: No vascular injury. Dense aortic atherosclerosis
without aneurysm. No retroperitoneal fluid. No adenopathy.

Reproductive: Uterus and bilateral adnexa are unremarkable.

Other: Mild stranding of the subcutaneous fat posterior to the left
hip. No free air or free fluid. Small fat containing umbilical
hernia.

Musculoskeletal: Mild L1 inferior endplate convexity, new from
prior. Scoliosis with diffuse degenerative disc disease and facet
arthropathy, mildly progressed. Bony pelvis is intact without acute
fracture.
IMPRESSION: 1. No acute intra-abdominal/pelvic traumatic at matter injury.
2. Stranding posterior to the left hip may reflect subcutaneous soft
tissue contusion.
3. Cconcavity of L1 inferior endplate, may reflect mild compression
fracture, age indeterminate versus developing Schmorl's node.
4.  Aortic Atherosclerosis (4175K-65V.V).  Colonic diverticulosis.

## 2019-07-08 ENCOUNTER — Other Ambulatory Visit: Payer: Self-pay

## 2019-07-08 ENCOUNTER — Ambulatory Visit: Payer: Medicare Other | Admitting: Podiatry

## 2019-07-08 DIAGNOSIS — M79675 Pain in left toe(s): Secondary | ICD-10-CM

## 2019-07-08 DIAGNOSIS — L600 Ingrowing nail: Secondary | ICD-10-CM

## 2019-07-08 DIAGNOSIS — M79674 Pain in right toe(s): Secondary | ICD-10-CM

## 2019-07-08 DIAGNOSIS — E119 Type 2 diabetes mellitus without complications: Secondary | ICD-10-CM | POA: Diagnosis not present

## 2019-07-08 DIAGNOSIS — B351 Tinea unguium: Secondary | ICD-10-CM

## 2019-07-08 NOTE — Patient Instructions (Signed)

## 2019-07-14 NOTE — Progress Notes (Signed)
Subjective:   Patient ID: Jennifer Gordon, female   DOB: 86 y.o.   MRN: [id number]   HPI 86 year old female presents the office today for concerns of an ingrown toe on the left big toe, medial aspect which the ankle for last 2 weeks.  She is on antibiotics.  Is tender at the tip of the toenail.  She does note some localized edema and redness but no red streaks and no drainage or pus currently.  She is also asking for the nails be trimmed as they are thick and elongated she cannot do them herself.  She has no other concerns today.   Review of Systems  All other systems reviewed and are negative.  Past Medical History:  Diagnosis Date  . Diabetes mellitus   . High cholesterol   . Hypertension   . S/P angioplasty with stent    #3 stents placed,   . Skin cancer of nose     Past Surgical History:  Procedure Laterality Date  . APPENDECTOMY    . TONSILLECTOMY       Current Outpatient Medications:  .  acarbose (PRECOSE) 25 MG tablet, Take 25 mg by mouth every evening. , Disp: , Rfl:  .  acetaminophen (TYLENOL) 500 MG tablet, Take 1 tablet (500 mg total) by mouth every 4 (four) hours as needed for moderate pain., Disp: 30 tablet, Rfl: 0 .  Artificial Tear Ointment (ARTIFICIAL TEARS) ointment, Place 1 drop into both eyes as needed (AS DIRECTED FOR DRY EYES). , Disp: , Rfl:  .  aspirin 81 MG tablet, Take 81 mg by mouth daily., Disp: , Rfl:  .  cycloSPORINE (RESTASIS) 0.05 % ophthalmic emulsion, Place 1 drop into both eyes 2 (two) times daily. Use for dry eyes., Disp: , Rfl:  .  dorzolamide-timolol (COSOPT) 22.3-6.8 MG/ML ophthalmic solution, Place 1 drop into both eyes 2 (two) times daily. Use for glaucoma, Disp: , Rfl:  .  doxycycline (ADOXA) 100 MG tablet, Take 100 mg by mouth 2 (two) times daily., Disp: , Rfl:  .  Eyelid Cleansers (OCUSOFT LID SCRUB EX), Apply topically 2 (two) times daily. As directed, Disp: , Rfl:  .  latanoprost (XALATAN) 0.005 % ophthalmic solution, Place 1 drop into  both eyes at bedtime. , Disp: , Rfl:  .  metFORMIN (GLUCOPHAGE) 1000 MG tablet, Take 1,000 mg by mouth 2 (two) times daily with a meal., Disp: , Rfl:  .  Multiple Vitamin (MULTIVITAMIN) capsule, Take 1 capsule by mouth daily. Patient takes Centrum Silver A-Z, Disp: , Rfl:  .  pioglitazone (ACTOS) 45 MG tablet, Take 45 mg by mouth daily., Disp: , Rfl:  .  rosuvastatin (CRESTOR) 10 MG tablet, Take 10 mg by mouth daily., Disp: , Rfl:  .  TRAMADOL HCL PO, Take by mouth. , Disp: , Rfl:   Allergies  Allergen Reactions  . Codeine Rash    Angioedema (ALLERGY/intolerance) Patient stated, " I don't remember what happens. I think its a body rash."  . Lipitor [Atorvastatin Calcium] Rash    Angioedema (ALLERGY/intolerance) Patient stated, " I don't remember what happens. I think its a body rash."  . Niaspan [Niacin Er] Rash    Angioedema (ALLERGY/intolerance) Patient stated, " I don't remember what happens. I think its a body rash."  . Ticlid [Ticlopidine Hcl] Rash    Angioedema (ALLERGY/intolerance) Pt. Stated, " I can't remember what happens. I think its a body rash."  . Zocor [Simvastatin - High Dose] Rash    Angioedema (ALLERGY/intolerance)  Patient stated, " I don't remember what happens. I think its a body rash."          Objective:  Physical Exam  General: AAO x3, NAD  Dermatological: Nails appear to be hypertrophic, dystrophic with yellow-brown discoloration and there is tenderness nails 1-5 bilaterally.  Specifically on the medial aspect the left hallux toenail there is incurvation of the distal medial aspect.  There is localized edema and faint erythema but there is no ascending cellulitis.  There is no drainage or pus.  No fluctuation or crepitation.  There is no open lesions.  Vascular: Dorsalis Pedis artery and Posterior Tibial artery pedal pulses are 2/4 bilateral with immedate capillary fill time. There is no pain with calf compression, swelling, warmth, erythema.   Neruologic:  Grossly intact via light touch bilateral.  Musculoskeletal: No gross boney pedal deformities bilateral. No pain, crepitus, or limitation noted with foot and ankle range of motion bilateral. Muscular strength 5/5 in all groups tested bilateral.      Assessment:   Symptomatic onychomycosis, ingrown toenail     Plan:  -Treatment options discussed including all alternatives, risks, and complications -Etiology of symptoms were discussed -Sharply debrided the left medial portion of the left hallux toenail with any complications or bleeding.  Recommended Epson salt soaks daily as well as antibiotic ointment dressing changes daily.  If symptoms continue will need to have a partial nail avulsion.  Monitor for any signs or symptoms of infection. -Debrided the nails x10 without any complications or bleeding.  Return in about 2 weeks (around 07/22/2019) for ingrown toenail; possible nail removal .  Trula Slade DPM

## 2019-07-22 ENCOUNTER — Ambulatory Visit: Payer: Medicare Other | Admitting: Podiatry

## 2019-09-29 ENCOUNTER — Other Ambulatory Visit: Payer: Self-pay

## 2019-09-29 ENCOUNTER — Ambulatory Visit: Payer: Medicare Other | Admitting: Podiatry

## 2019-09-29 ENCOUNTER — Encounter: Payer: Self-pay | Admitting: Podiatry

## 2019-09-29 DIAGNOSIS — E119 Type 2 diabetes mellitus without complications: Secondary | ICD-10-CM

## 2019-09-29 DIAGNOSIS — B351 Tinea unguium: Secondary | ICD-10-CM | POA: Diagnosis not present

## 2019-09-29 DIAGNOSIS — M79675 Pain in left toe(s): Secondary | ICD-10-CM

## 2019-09-29 DIAGNOSIS — M79674 Pain in right toe(s): Secondary | ICD-10-CM | POA: Diagnosis not present

## 2019-09-29 DIAGNOSIS — S99922A Unspecified injury of left foot, initial encounter: Secondary | ICD-10-CM | POA: Diagnosis not present

## 2019-09-29 NOTE — Patient Instructions (Signed)
Apply a small amount of antibiotic ointment to the left big toenail daily. If you notice any increase in redness, drainage, swelling, pain please let me know.

## 2019-10-01 NOTE — Progress Notes (Signed)
Subjective: 86 year old female presents to the office today for concerns of her nails being thickened elongated.  In particular her left big toenail has had some dried blood at the tip of the nail.  They are not sure if the toe was bumped or hit.  She does not recall any injury and her caregiver is not sure of any injury but they think that she may have bumped her toe on something.  No swelling or redness or any drainage or pus currently. Denies any systemic complaints such as fevers, chills, nausea, vomiting. No acute changes since last appointment, and no other complaints at this time.   Objective: NAD DP/PT pulses palpable bilaterally, CRT less than 3 seconds Nails appear to be hypertrophic, dystrophic with yellow-brown discoloration.  There is tenderness in all the nails.  Particular left hallux toenail the distal aspect of dried blood present but there is no drainage or pus or any edema, erythema.  Initially the plan was made for possible infection this was based on the picture that she had sent to her caregiver but not able to identify any infection today. No open lesions or pre-ulcerative lesions.  No pain with calf compression, swelling, warmth, erythema  Assessment: 86 year old female with symptomatic onychomycosis, possible left hallux toenail injury  Plan: -All treatment options discussed with the patient including all alternatives, risks, complications.  -Debrided nails x10 without any complications or bleeding.  There is no signs of infection noted today but current monitor closely.  I dispensed offloading pad for the big toe.  Discussed antibiotic ointment to the left big toe daily. -Patient encouraged to call the office with any questions, concerns, change in symptoms.     Trula Slade DPM

## 2019-11-08 NOTE — Progress Notes (Signed)
Cardiology Office Note   Date:  11/10/2019   ID:  Jennifer Gordon, DOB 02-14-1922, MRN [id number]  PCP:  Hulan Fess, MD    No chief complaint on file.  CAD  Wt Readings from Last 3 Encounters:  11/10/19 133 lb (60.3 kg)  07/12/17 140 lb (63.5 kg)  04/20/17 135 lb (61.2 kg)       History of Present Illness: Jennifer Gordon is a 86 y.o. female  With  coronary artery disease including several stents many years ago. She has chronic back and knee pain which limit her walking.   She fell in 11/18 when her knee ebuckled and broke her left wrist.  SHe has recovered.  She still feels tha there knee gives out and this causes the falling.    She did OT and PT.  She was in rehab for 20 days.   Since the last visit in 2019, no falls.  She does not recall her wrist fracture from 2 years ago and only "vaguely" remembers her stint in rehab.  Denies : Chest pain. Dizziness. Nitroglycerin use. Orthopnea. Palpitations. Paroxysmal nocturnal dyspnea. Shortness of breath. Syncope.   She does not notice the leg edema, but it was mentioned to her.  Her recliner broke, and edema got worse at that point.  Edema improved once recliner was fixed and she could elevate her legs.    She always uses her walker.  She still lives at home.  She has an aide that comes in to help 3 hours per day.  She did not get her COVID vaccines.  The person with her states, "we are not getting that vaccine."    Past Medical History:  Diagnosis Date   Diabetes mellitus    High cholesterol    Hypertension    S/P angioplasty with stent    #3 stents placed,    Skin cancer of nose     Past Surgical History:  Procedure Laterality Date   APPENDECTOMY     TONSILLECTOMY       Current Outpatient Medications  Medication Sig Dispense Refill   acarbose (PRECOSE) 25 MG tablet Take 25 mg by mouth every evening.      acetaminophen (TYLENOL) 500 MG tablet Take 1 tablet (500 mg total) by mouth every 4  (four) hours as needed for moderate pain. 30 tablet 0   Artificial Tear Ointment (ARTIFICIAL TEARS) ointment Place 1 drop into both eyes as needed (AS DIRECTED FOR DRY EYES).      aspirin 81 MG tablet Take 81 mg by mouth daily.     cycloSPORINE (RESTASIS) 0.05 % ophthalmic emulsion Place 1 drop into both eyes 2 (two) times daily. Use for dry eyes.     dorzolamide-timolol (COSOPT) 22.3-6.8 MG/ML ophthalmic solution Place 1 drop into both eyes 2 (two) times daily. Use for glaucoma     doxycycline (ADOXA) 100 MG tablet Take 100 mg by mouth 2 (two) times daily.     Eyelid Cleansers (OCUSOFT LID SCRUB EX) Apply topically 2 (two) times daily. As directed     latanoprost (XALATAN) 0.005 % ophthalmic solution Place 1 drop into both eyes at bedtime.      metFORMIN (GLUCOPHAGE) 1000 MG tablet Take 1,000 mg by mouth 2 (two) times daily with a meal.     Multiple Vitamin (MULTIVITAMIN) capsule Take 1 capsule by mouth daily. Patient takes Centrum Silver A-Z     pioglitazone (ACTOS) 45 MG tablet Take 45 mg by mouth daily.  rosuvastatin (CRESTOR) 10 MG tablet Take 10 mg by mouth daily.     TRAMADOL HCL PO Take by mouth.      No current facility-administered medications for this visit.    Allergies:   Codeine, Lipitor [atorvastatin calcium], Niaspan [niacin er], Ticlid [ticlopidine hcl], and Zocor [simvastatin - high dose]    Social History:  The patient  reports that she has never smoked. She has never used smokeless tobacco. She reports that she does not drink alcohol and does not use drugs.   Family History:  The patient's family history includes Heart attack in her mother; Hypertension in her brother.    ROS:  Please see the history of present illness.   Otherwise, review of systems are positive for poor balance, uses walker.   All other systems are reviewed and negative.    PHYSICAL EXAM: VS:  BP 126/60    Pulse 96    Ht 5\' 2"  (1.575 m)    Wt 133 lb (60.3 kg)    SpO2 97%    BMI 24.33  kg/m  , BMI Body mass index is 24.33 kg/m. GEN: Frail, white haired, in no acute distress  HEENT: normal  Neck: no JVD, carotid bruits, or masses Cardiac: RRR; no murmurs, rubs, or gallops,tr LE edema  Respiratory:  clear to auscultation bilaterally, normal work of breathing GI: soft, nontender, nondistended, + BS MS: no deformity or atrophy  Skin: warm and dry, no rash Neuro:  Strength and sensation are intact Psych: euthymic mood, full affect   EKG:   The ekg ordered today demonstrates NSR, prolonged PR interval, nonspecific ST changes   Recent Labs: No results found for requested labs within last 8760 hours.   Lipid Panel No results found for: CHOL, TRIG, HDL, CHOLHDL, VLDL, LDLCALC, LDLDIRECT   Other studies Reviewed: Additional studies/ records that were reviewed today with results demonstrating: PMD labs reviewed.   ASSESSMENT AND PLAN:  1. CAD: Lateral defect on 2011 stress test.  No angina on medical therapy. COntinue for now.  With increased risks given her age, and memory status, would be hesitant to do any type of invasive cardiac w/u.  2. HTN: BP meds stopped several years ago.  The current medical regimen is effective;  continue present plan and medications.  Does not eat much in general. Avoid high salt foods.  3. Hyperlipidemia: Continue rosuvastatin. LDL 50 4. LE edema: elevate legs. Venous insufficiency is the cause of her edema.  No need for compression stockings or diuretics.   Current medicines are reviewed at length with the patient today.  The patient concerns regarding her medicines were addressed.  The following changes have been made:  No change  Labs/ tests ordered today include:   Orders Placed This Encounter  Procedures   EKG 12-Lead    Recommend 150 minutes/week of aerobic exercise Low fat, low carb, high fiber diet recommended  Disposition:   FU in 1 year   Signed, Larae Grooms, MD  11/10/2019 8:28 AM    [facility] [place] [facility] [place], [place], [place]  [address] Phone: [phone]; Fax: [phone]

## 2019-11-10 ENCOUNTER — Encounter (INDEPENDENT_AMBULATORY_CARE_PROVIDER_SITE_OTHER): Payer: Self-pay

## 2019-11-10 ENCOUNTER — Encounter: Payer: Self-pay | Admitting: Interventional Cardiology

## 2019-11-10 ENCOUNTER — Other Ambulatory Visit: Payer: Self-pay

## 2019-11-10 ENCOUNTER — Ambulatory Visit: Payer: Medicare Other | Admitting: Interventional Cardiology

## 2019-11-10 VITALS — BP 126/60 | HR 96 | Ht 62.0 in | Wt 133.0 lb

## 2019-11-10 DIAGNOSIS — R600 Localized edema: Secondary | ICD-10-CM | POA: Diagnosis not present

## 2019-11-10 DIAGNOSIS — E782 Mixed hyperlipidemia: Secondary | ICD-10-CM | POA: Diagnosis not present

## 2019-11-10 DIAGNOSIS — I10 Essential (primary) hypertension: Secondary | ICD-10-CM

## 2019-11-10 DIAGNOSIS — I25118 Atherosclerotic heart disease of native coronary artery with other forms of angina pectoris: Secondary | ICD-10-CM | POA: Diagnosis not present

## 2019-11-10 NOTE — Patient Instructions (Signed)

## 2019-12-30 ENCOUNTER — Ambulatory Visit: Payer: Medicare Other | Admitting: Podiatry

## 2020-01-15 ENCOUNTER — Ambulatory Visit: Payer: Medicare Other | Admitting: Podiatry

## 2020-01-27 ENCOUNTER — Encounter: Payer: Self-pay | Admitting: Podiatry

## 2020-01-27 ENCOUNTER — Ambulatory Visit: Payer: Medicare Other | Admitting: Podiatry

## 2020-01-27 ENCOUNTER — Other Ambulatory Visit: Payer: Self-pay

## 2020-01-27 DIAGNOSIS — M79675 Pain in left toe(s): Secondary | ICD-10-CM | POA: Diagnosis not present

## 2020-01-27 DIAGNOSIS — B351 Tinea unguium: Secondary | ICD-10-CM | POA: Diagnosis not present

## 2020-01-27 DIAGNOSIS — M79674 Pain in right toe(s): Secondary | ICD-10-CM

## 2020-01-27 DIAGNOSIS — E119 Type 2 diabetes mellitus without complications: Secondary | ICD-10-CM

## 2020-02-05 NOTE — Progress Notes (Signed)
Subjective: 86 y.o. returns the office today for painful, elongated, thickened toenails which she cannot trim herself. Denies any redness or drainage around the nails. Denies any acute changes since last appointment and no new complaints today. Denies any systemic complaints such as fevers, chills, nausea, vomiting.   PCP: Hulan Fess, MD  Objective: NAD DP/PT pulses palpable, CRT less than 3 seconds Nails hypertrophic, dystrophic, elongated, brittle, discolored 10. There is tenderness overlying the nails 1-5 bilaterally. There is no surrounding erythema or drainage along the nail sites. No open lesions or pre-ulcerative lesions are identified. No other areas of tenderness bilateral lower extremities. No overlying edema, erythema, increased warmth. No pain with calf compression, swelling, warmth, erythema.  Assessment: Patient presents with symptomatic onychomycosis  Plan: -Treatment options including alternatives, risks, complications were discussed -Nails sharply debrided 10 without complication/bleeding. -Discussed daily foot inspection. If there are any changes, to call the office immediately.  -Follow-up in 3 months or sooner if any problems are to arise. In the meantime, encouraged to call the office with any questions, concerns, changes symptoms.  Celesta Gentile, DPM

## 2020-04-27 ENCOUNTER — Ambulatory Visit: Payer: Medicare Other | Admitting: Podiatry

## 2020-04-27 ENCOUNTER — Other Ambulatory Visit: Payer: Self-pay

## 2020-04-27 DIAGNOSIS — M79675 Pain in left toe(s): Secondary | ICD-10-CM

## 2020-04-27 DIAGNOSIS — E119 Type 2 diabetes mellitus without complications: Secondary | ICD-10-CM

## 2020-04-27 DIAGNOSIS — B351 Tinea unguium: Secondary | ICD-10-CM | POA: Diagnosis not present

## 2020-04-27 DIAGNOSIS — M79674 Pain in right toe(s): Secondary | ICD-10-CM

## 2020-04-27 NOTE — Progress Notes (Signed)
Subjective: 86 y.o. returns the office today for painful, elongated, thickened toenails which she cannot trim herself. Denies any redness or drainage around the nails. Denies any acute changes since last appointment and no new complaints today. Denies any systemic complaints such as fevers, chills, nausea, vomiting.   PCP: Hulan Fess, MD  Objective: NAD DP/PT pulses palpable, CRT less than 3 seconds Nails hypertrophic, dystrophic, elongated, brittle, discolored 10. There is tenderness overlying the nails 1-5 bilaterally. There is no surrounding erythema or drainage along the nail sites. Minimal brusing on left hallux base but no open lesions, redness, warmth. No pain/deformity. Her caregiver states "it must have just happed". No injury.  No open lesions or pre-ulcerative lesions are identified. No pain with calf compression, swelling, warmth, erythema.  Assessment: Patient presents with symptomatic onychomycosis  Plan: -Treatment options including alternatives, risks, complications were discussed -Nails sharply debrided 10 without complication/bleeding. -Monitor left hallux bruising.  -Discussed daily foot inspection. If there are any changes, to call the office immediately.  -Follow-up in 3 months or sooner if any problems are to arise. In the meantime, encouraged to call the office with any questions, concerns, changes symptoms.  Celesta Gentile, DPM

## 2020-06-04 DIAGNOSIS — M810 Age-related osteoporosis without current pathological fracture: Secondary | ICD-10-CM | POA: Diagnosis not present

## 2020-06-04 DIAGNOSIS — I2510 Atherosclerotic heart disease of native coronary artery without angina pectoris: Secondary | ICD-10-CM | POA: Diagnosis not present

## 2020-06-04 DIAGNOSIS — I10 Essential (primary) hypertension: Secondary | ICD-10-CM | POA: Diagnosis not present

## 2020-06-04 DIAGNOSIS — H409 Unspecified glaucoma: Secondary | ICD-10-CM | POA: Diagnosis not present

## 2020-06-04 DIAGNOSIS — E782 Mixed hyperlipidemia: Secondary | ICD-10-CM | POA: Diagnosis not present

## 2020-06-04 DIAGNOSIS — E118 Type 2 diabetes mellitus with unspecified complications: Secondary | ICD-10-CM | POA: Diagnosis not present

## 2020-06-04 DIAGNOSIS — I25119 Atherosclerotic heart disease of native coronary artery with unspecified angina pectoris: Secondary | ICD-10-CM | POA: Diagnosis not present

## 2020-06-17 DIAGNOSIS — M810 Age-related osteoporosis without current pathological fracture: Secondary | ICD-10-CM | POA: Diagnosis not present

## 2020-07-27 ENCOUNTER — Encounter: Payer: Self-pay | Admitting: Podiatry

## 2020-07-27 ENCOUNTER — Ambulatory Visit: Payer: Medicare Other | Admitting: Podiatry

## 2020-07-27 ENCOUNTER — Other Ambulatory Visit: Payer: Self-pay

## 2020-07-27 DIAGNOSIS — M79675 Pain in left toe(s): Secondary | ICD-10-CM

## 2020-07-27 DIAGNOSIS — B351 Tinea unguium: Secondary | ICD-10-CM | POA: Diagnosis not present

## 2020-07-27 DIAGNOSIS — M79674 Pain in right toe(s): Secondary | ICD-10-CM

## 2020-07-27 DIAGNOSIS — E119 Type 2 diabetes mellitus without complications: Secondary | ICD-10-CM | POA: Diagnosis not present

## 2020-07-28 NOTE — Progress Notes (Signed)
Subjective: 87 y.o. returns the office today for painful, elongated, thickened toenails which she cannot trim herself. Denies any redness or drainage around the nails. Denies any acute changes since last appointment and no new complaints today. Denies any systemic complaints such as fevers, chills, nausea, vomiting.   PCP: Hulan Fess, MD  Objective: NAD DP/PT pulses palpable, CRT less than 3 seconds Nails hypertrophic, dystrophic, elongated, brittle, discolored 10. There is tenderness overlying the nails 1-5 bilaterally.  Upon debridement of her left big toenail, medial aspect small metal drainage expressed in the very distal aspect.  No edema, erythema.  After debridement there is no further drainage identified there is no ulcerations.  There is no surrounding erythema or drainage along the nail sites. Bruising is resolved. No open lesions or pre-ulcerative lesions are identified. No pain with calf compression, swelling, warmth, erythema.  Assessment: Patient presents with symptomatic onychomycosis  Plan: -Treatment options including alternatives, risks, complications were discussed -Nails sharply debrided 10 without complication/bleeding.  There is drainage coming from the left hallux nail which I debrided today.  No further drainage.  Recommend antibiotic ointment dressing changes daily.  If not healed next 1 to 2 weeks if there is any further drainage or signs of infection to let me know immediately. -Discussed daily foot inspection. If there are any changes, to call the office immediately.  -Follow-up in 9 weeks or sooner if any problems are to arise. In the meantime, encouraged to call the office with any questions, concerns, changes symptoms.  Celesta Gentile, DPM

## 2020-08-16 DIAGNOSIS — H409 Unspecified glaucoma: Secondary | ICD-10-CM | POA: Diagnosis not present

## 2020-08-16 DIAGNOSIS — E118 Type 2 diabetes mellitus with unspecified complications: Secondary | ICD-10-CM | POA: Diagnosis not present

## 2020-08-16 DIAGNOSIS — M810 Age-related osteoporosis without current pathological fracture: Secondary | ICD-10-CM | POA: Diagnosis not present

## 2020-08-16 DIAGNOSIS — E782 Mixed hyperlipidemia: Secondary | ICD-10-CM | POA: Diagnosis not present

## 2020-08-16 DIAGNOSIS — I10 Essential (primary) hypertension: Secondary | ICD-10-CM | POA: Diagnosis not present

## 2020-08-16 DIAGNOSIS — I2510 Atherosclerotic heart disease of native coronary artery without angina pectoris: Secondary | ICD-10-CM | POA: Diagnosis not present

## 2020-08-16 DIAGNOSIS — I25119 Atherosclerotic heart disease of native coronary artery with unspecified angina pectoris: Secondary | ICD-10-CM | POA: Diagnosis not present

## 2020-09-28 ENCOUNTER — Ambulatory Visit: Payer: Medicare Other | Admitting: Podiatry

## 2020-09-28 ENCOUNTER — Other Ambulatory Visit: Payer: Self-pay

## 2020-09-28 DIAGNOSIS — M79675 Pain in left toe(s): Secondary | ICD-10-CM

## 2020-09-28 DIAGNOSIS — M79674 Pain in right toe(s): Secondary | ICD-10-CM

## 2020-09-28 DIAGNOSIS — B351 Tinea unguium: Secondary | ICD-10-CM

## 2020-09-28 DIAGNOSIS — E119 Type 2 diabetes mellitus without complications: Secondary | ICD-10-CM

## 2020-09-28 NOTE — Progress Notes (Signed)
Subjective: 87 y.o. returns the office today for painful, elongated, thickened toenails which she cannot trim herself. Denies any redness or drainage around the nails.  Denies any open lesions.  Denies any acute changes since last appointment and no new complaints today. Denies any systemic complaints such as fevers, chills, nausea, vomiting.   PCP: Hulan Fess, MD  Objective: NAD DP/PT pulses palpable, CRT less than 3 seconds Nails hypertrophic, dystrophic, elongated, brittle, discolored 10. There is tenderness overlying the nails 1-5 bilaterally.  Mild incurvation of the hallux nails but no edema, erythema and signs of infection.  No drainage. No open lesions or pre-ulcerative lesions are identified. No pain with calf compression, swelling, warmth, erythema.  Assessment: Patient presents with symptomatic onychomycosis  Plan: -Treatment options including alternatives, risks, complications were discussed -Nails sharply debrided 10 without complication/bleeding.   -Discussed daily foot inspection. If there are any changes, to call the office immediately.  -Follow-up in 9 weeks or sooner if any problems are to arise. In the meantime, encouraged to call the office with any questions, concerns, changes symptoms.  Celesta Gentile, DPM

## 2020-11-30 ENCOUNTER — Ambulatory Visit: Payer: Medicare Other | Admitting: Podiatry

## 2020-11-30 ENCOUNTER — Other Ambulatory Visit: Payer: Self-pay

## 2020-11-30 ENCOUNTER — Encounter: Payer: Self-pay | Admitting: Podiatry

## 2020-11-30 DIAGNOSIS — E119 Type 2 diabetes mellitus without complications: Secondary | ICD-10-CM | POA: Diagnosis not present

## 2020-11-30 DIAGNOSIS — M79675 Pain in left toe(s): Secondary | ICD-10-CM | POA: Diagnosis not present

## 2020-11-30 DIAGNOSIS — B351 Tinea unguium: Secondary | ICD-10-CM | POA: Diagnosis not present

## 2020-11-30 DIAGNOSIS — M79674 Pain in right toe(s): Secondary | ICD-10-CM | POA: Diagnosis not present

## 2020-12-06 NOTE — Progress Notes (Signed)
Subjective: 87 y.o. returns the office today for painful, elongated, thickened toenails which she cannot trim herself. Denies any redness or drainage around the nails.  Denies any open lesions.  Denies any acute changes since last appointment and no new complaints today. Denies any systemic complaints such as fevers, chills, nausea, vomiting.   PCP: Hulan Fess, MD  Objective: NAD DP/PT pulses palpable, CRT less than 3 seconds Nails hypertrophic, dystrophic, elongated, brittle, discolored 10. There is tenderness overlying the nails 1-5 bilaterally.  Mild incurvation of the hallux nails but no edema, erythema and signs of infection.  No drainage. No open lesions or pre-ulcerative lesions are identified. No pain with calf compression, swelling, warmth, erythema.  Assessment: Patient presents with symptomatic onychomycosis  Plan: -Treatment options including alternatives, risks, complications were discussed -Nails sharply debrided 10 without complication/bleeding.   -Discussed daily foot inspection. If there are any changes, to call the office immediately.  -Follow-up in 9 weeks or sooner if any problems are to arise. In the meantime, encouraged to call the office with any questions, concerns, changes symptoms.  Celesta Gentile, DPM

## 2021-02-01 ENCOUNTER — Other Ambulatory Visit: Payer: Self-pay

## 2021-02-01 ENCOUNTER — Ambulatory Visit: Payer: Medicare Other | Admitting: Podiatry

## 2021-02-01 DIAGNOSIS — M79675 Pain in left toe(s): Secondary | ICD-10-CM | POA: Diagnosis not present

## 2021-02-01 DIAGNOSIS — M79674 Pain in right toe(s): Secondary | ICD-10-CM | POA: Diagnosis not present

## 2021-02-01 DIAGNOSIS — E119 Type 2 diabetes mellitus without complications: Secondary | ICD-10-CM | POA: Diagnosis not present

## 2021-02-01 DIAGNOSIS — B351 Tinea unguium: Secondary | ICD-10-CM | POA: Diagnosis not present

## 2021-02-02 NOTE — Progress Notes (Signed)
Subjective: 87 y.o. returns the office today for painful, elongated, thickened toenails which she cannot trim herself. Denies any redness or drainage around the nails.  Denies any open lesions.  Denies any acute changes since last appointment and no new complaints today. Denies any systemic complaints such as fevers, chills, nausea, vomiting.   PCP: Hulan Fess, MD Last seen 06/17/2020  Objective: NAD DP/PT pulses palpable, CRT less than 3 seconds Nails hypertrophic, dystrophic, elongated, brittle, discolored 10. There is tenderness overlying the nails 1-5 bilaterally.  Mild incurvation of the hallux nails but no edema, erythema and signs of infection.  No drainage. No open lesions  No pain with calf compression, swelling, warmth, erythema.  Assessment: Patient presents with symptomatic onychomycosis  Plan: -Treatment options including alternatives, risks, complications were discussed -Nails sharply debrided 10 without complication/bleeding.   -Discussed daily foot inspection. If there are any changes, to call the office immediately.  -Follow-up in 9 weeks or sooner if any problems are to arise. In the meantime, encouraged to call the office with any questions, concerns, changes symptoms.  Celesta Gentile, DPM

## 2021-04-04 ENCOUNTER — Ambulatory Visit: Payer: Medicare Other | Admitting: Podiatry

## 2021-04-04 ENCOUNTER — Other Ambulatory Visit: Payer: Self-pay

## 2021-04-04 DIAGNOSIS — E119 Type 2 diabetes mellitus without complications: Secondary | ICD-10-CM | POA: Diagnosis not present

## 2021-04-04 DIAGNOSIS — M79675 Pain in left toe(s): Secondary | ICD-10-CM

## 2021-04-04 DIAGNOSIS — B351 Tinea unguium: Secondary | ICD-10-CM | POA: Diagnosis not present

## 2021-04-04 DIAGNOSIS — M79674 Pain in right toe(s): Secondary | ICD-10-CM

## 2021-04-07 NOTE — Progress Notes (Signed)
Subjective: 87 y.o. returns the office today for painful, elongated, thickened toenails which she cannot trim herself. Denies any redness or drainage around the nails.  Denies any open lesions.   Denies any systemic complaints such as fevers, chills, nausea, vomiting.   PCP: Hulan Fess, MD Last seen 06/17/2020  Objective: NAD DP/PT pulses palpable, CRT less than 3 seconds Nails hypertrophic, dystrophic, elongated, brittle, discolored 10. There is tenderness overlying the nails 1-5 bilaterally.  Mild incurvation of the hallux nails but no edema, erythema and signs of infection.  No drainage. No open lesions  No pain with calf compression, swelling, warmth, erythema.  Assessment: Patient presents with symptomatic onychomycosis  Plan: -Treatment options including alternatives, risks, complications were discussed -Nails sharply debrided 10 without complication/bleeding.   -Discussed daily foot inspection. If there are any changes, to call the office immediately.  -Follow-up in 9 weeks or sooner if any problems are to arise. In the meantime, encouraged to call the office with any questions, concerns, changes symptoms.  Celesta Gentile, DPM

## 2021-06-03 ENCOUNTER — Telehealth: Payer: Self-pay

## 2021-06-03 NOTE — Telephone Encounter (Signed)
Attempted to contact patient to schedule a Palliative Care consult appointment. No answer or voicemail.   

## 2021-06-07 ENCOUNTER — Ambulatory Visit: Payer: Medicare Other | Admitting: Podiatry

## 2021-06-07 ENCOUNTER — Other Ambulatory Visit: Payer: Self-pay

## 2021-06-07 DIAGNOSIS — M79674 Pain in right toe(s): Secondary | ICD-10-CM | POA: Diagnosis not present

## 2021-06-07 DIAGNOSIS — M79675 Pain in left toe(s): Secondary | ICD-10-CM | POA: Diagnosis not present

## 2021-06-07 DIAGNOSIS — E119 Type 2 diabetes mellitus without complications: Secondary | ICD-10-CM | POA: Diagnosis not present

## 2021-06-07 DIAGNOSIS — B351 Tinea unguium: Secondary | ICD-10-CM

## 2021-06-09 ENCOUNTER — Telehealth: Payer: Self-pay

## 2021-06-09 NOTE — Telephone Encounter (Signed)
Spoke with patient's friend Rennie Natter and scheduled an in-person Palliative Consult for 06/23/21 @ 1PM with Dr. Hollace Kinnier. Documentation will be noted in [facility].  COVID screening was negative. No pets in home. Patient lives alone has caregivers 3 hours per day.  Consent obtained; updated Outlook/Netsmart/Team List and Epic.   Jennifer Gordon is aware she may be receiving a call from provider the day before or day of to confirm appointment.

## 2021-06-12 NOTE — Progress Notes (Signed)
Subjective: 88 y.o. returns the office today for painful, elongated, thickened toenails which she cannot trim herself. Denies any redness or drainage around the nails.  No open lesions and no recent injury or changes otherwise since I last saw her.  PCP: Hulan Fess, MD  Objective: NAD DP/PT pulses palpable, CRT less than 3 seconds Nails hypertrophic, dystrophic, elongated, brittle, discolored 10. There is tenderness overlying the nails 1-5 bilaterally.  Mild incurvation of the hallux nails but no edema, erythema and signs of infection.  No drainage. No open lesions  No pain with calf compression, swelling, warmth, erythema.  Assessment: Patient presents with symptomatic onychomycosis  Plan: -Treatment options including alternatives, risks, complications were discussed -Nails sharply debrided 10 without complication/bleeding.   -Discussed daily foot inspection. If there are any changes, to call the office immediately.  -Follow-up in 9 weeks or sooner if any problems are to arise. In the meantime, encouraged to call the office with any questions, concerns, changes symptoms.  Celesta Gentile, DPM

## 2021-06-28 ENCOUNTER — Other Ambulatory Visit: Payer: Self-pay

## 2021-06-28 ENCOUNTER — Other Ambulatory Visit: Payer: Medicare Other | Admitting: Internal Medicine

## 2021-06-28 ENCOUNTER — Encounter: Payer: Self-pay | Admitting: Internal Medicine

## 2021-06-28 DIAGNOSIS — R809 Proteinuria, unspecified: Secondary | ICD-10-CM | POA: Insufficient documentation

## 2021-06-28 DIAGNOSIS — R413 Other amnesia: Secondary | ICD-10-CM

## 2021-06-28 DIAGNOSIS — H409 Unspecified glaucoma: Secondary | ICD-10-CM | POA: Insufficient documentation

## 2021-06-28 DIAGNOSIS — M810 Age-related osteoporosis without current pathological fracture: Secondary | ICD-10-CM | POA: Insufficient documentation

## 2021-06-28 DIAGNOSIS — N952 Postmenopausal atrophic vaginitis: Secondary | ICD-10-CM | POA: Insufficient documentation

## 2021-06-28 DIAGNOSIS — I209 Angina pectoris, unspecified: Secondary | ICD-10-CM | POA: Insufficient documentation

## 2021-06-28 DIAGNOSIS — I6521 Occlusion and stenosis of right carotid artery: Secondary | ICD-10-CM | POA: Insufficient documentation

## 2021-06-28 DIAGNOSIS — R54 Age-related physical debility: Secondary | ICD-10-CM

## 2021-06-28 DIAGNOSIS — F0390 Unspecified dementia without behavioral disturbance: Secondary | ICD-10-CM | POA: Insufficient documentation

## 2021-06-28 DIAGNOSIS — F4321 Adjustment disorder with depressed mood: Secondary | ICD-10-CM | POA: Insufficient documentation

## 2021-06-28 DIAGNOSIS — E118 Type 2 diabetes mellitus with unspecified complications: Secondary | ICD-10-CM | POA: Insufficient documentation

## 2021-06-28 DIAGNOSIS — H04129 Dry eye syndrome of unspecified lacrimal gland: Secondary | ICD-10-CM | POA: Insufficient documentation

## 2021-06-28 DIAGNOSIS — H401194 Primary open-angle glaucoma, unspecified eye, indeterminate stage: Secondary | ICD-10-CM

## 2021-06-28 NOTE — Progress Notes (Signed)
[facility] Consult Note Telephone: [phone]  Fax: [phone]   Date of encounter: 06/28/21 12:30 PM PATIENT NAME: Jennifer Gordon [address] [address]   [phone] (home)  DOB: [birth year] MRN: [id number] PRIMARY CARE PROVIDER:    Hulan Fess, MD,  [place] [address] [phone]  REFERRING PROVIDER:   Hulan Fess, MD [place],  [place] [address] [phone]  RESPONSIBLE PARTY:    Contact Information     Name Relation Home Work [place] Friend [phone]          I met face to face with patient and family in pt's home. Palliative Care was asked to follow this patient by consultation request of  Little, Lennette Bihari, MD to address advance care planning and complex medical decision making. This is the initial visit.                                     ASSESSMENT AND PLAN / RECOMMENDATIONS:   Advance Care Planning/Goals of Care: Goals include to maximize quality of life and symptom management. Patient/health care surrogate gave his/her permission to discuss.Our advance care planning conversation included a discussion about:    The value and importance of advance care planning  Experiences with loved ones who have been seriously ill or have died  Exploration of personal, cultural or spiritual beliefs that might influence medical decisions  Exploration of goals of care in the event of a sudden injury or illness  Identification  of a healthcare agent--son, Ronalee Belts is health care agent; best friend, Rennie Natter is one that attends appts as Ronalee Belts lives in [place], she also provides care and talks on phone with patient Review and updating or creation of an  advance directive document . Decision not to resuscitate or to de-escalate disease focused treatments due to poor prognosis. CODE STATUS:  Pending--initiated discussion and son, Ronalee Belts, is going to pull out a  living will document pt completed several years ago  Goal is to remain in her home long-term with caregiver support  Symptom Management/Plan: 1. Memory impairment -son, Ronalee Belts, friend, Rennie Natter and caregivers they have 3 hrs 7 days a week have come up with several creative ways to maintain pt's independence   2. Primary open-angle glaucoma, indeterminate stage, unspecified laterality -if this continues to decline, it will affect a lot of the great interventions put into place at this time, continue drops and ophtho monitoring, glasses, high lighting for reading  3. Frailty syndrome in geriatric patient -pt getting notably weaker over time, using rollator but requiring help with bathing, dressing and can only microwave meals, use hot water pot for coffee -cont caregiver hours and creative interventions -monitor over time and offer advice and support as needed   Follow up Palliative Care Visit: Palliative care will continue to follow for complex medical decision making, advance care planning, and clarification of goals. Return 12 weeks or prn.   This visit was coded based on medical decision making (MDM). 16 mins on ACP introducing DNR, MOST, addressing goals and explaining difference between legal docs and physicians' orders, role of palliative care.  PPS: 40%  HOSPICE ELIGIBILITY/DIAGNOSIS: TBD  Chief Complaint: new palliative consult  HISTORY OF PRESENT ILLNESS:  Jennifer Gordon is a 88 y.o. year old female  with memory loss, htn, CAD s/p angioplasty, angina, atrophic vaginitis, glaucoma, dry  eyes, DMII, htn,hyperlipidemia, osteoporosis on prolia injections, right carotid stenosis among others.  Met with pt, son, Ronalee Belts, and best friend, Rennie Natter who help with her care along with 3h/day of hired caregivers.  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  She remains quite spunky but is very HOH and her vision has declined some due to glaucoma and dry eyes.  She gets more  confused when tired and if in different environments.  Her son and friend have created great solutions to various things along the way.  Pt has great routine each day of getting up, washing and dressing, having decaf coffee with toast with butter, watching tv, then son calls to remind about if she did ok with breakfast and to take morning pills and eyedrops using swivel system and checklist.  12-3 caregiver comes to make her lunch and set up her dinner by putting directions on meal.  Instructions recently had to be added on how to use the microwave.  Ronalee Belts has just ordered an [employer] for her instant coffee b/c of concerns with stove so not using it anymore.  Pt does have life alert button.  Louie Casa, the neighbor, is also helpful.    Of note, prior to a fall in her kitchen with wrist fracture and subsequent rehab stay, she was entirely independent.  She still signs her own checks, but Legrand Como supervises this.  She does have occasional incontinence of urine.   History obtained from review of EMR, discussion with primary team, and interview with family, facility staff/caregiver and/or Ms. Alipio.   I reviewed available labs, medications, imaging, studies and related documents from the EMR.  Records reviewed and summarized above.   ROS  General: NAD EYES: has vision changes ENMT: denies dysphagia Cardiovascular: denies chest pain, denies DOE but gets fatigued when walking 50-100 ft to go to a restaurant and using rollator Pulmonary: denies cough, denies increased SOB Abdomen: endorses fair appetite, denies constipation, endorses continence of bowel GU: denies dysuria, endorses continence of urine--uses bedside commode at night MSK:  has increased weakness,  no falls reported Skin: denies rashes or wounds Neurological: denies pain, denies insomnia Psych: Endorses positive mood Heme/lymph/immuno: denies bruises, abnormal bleeding  Physical Exam: Current and past weights: 135 lbs with bmi 25  at [facility] appt 05/31/21 (down 5 lbs but unclear from when) Constitutional: NAD General: frail appearing, pale EYES: anicteric sclera, lids intact, no discharge  ENMT: HOH--uses hearing aids, oral mucous membranes moist, dentition intact CV: no LE edema Pulmonary:  no increased work of breathing, no cough, room air Abdomen: intake 75%, normo-active BS + 4 quadrants, soft and non tender, no ascites GU: deferred MSK: has some sarcopenia, moves all extremities, ambulatory with rollator Skin: warm and dry, no rashes or wounds on visible skin Neuro: has  generalized weakness, has cognitive impairment Psych: non-anxious affect, A and O x 2--helped by calendar/clock with day, date and time Hem/lymph/immuno: no widespread bruising  CURRENT PROBLEM LIST:  Patient Active Problem List   Diagnosis Date Noted   Coronary atherosclerosis of native coronary artery 08/06/2013   Edema 08/06/2013   Hypertension    High cholesterol    S/P angioplasty with stent    Skin cancer of nose    Osteoarthritis of both knees 01/03/2013   PAST MEDICAL HISTORY:  Active Ambulatory Problems    Diagnosis Date Noted   Hypertension    High cholesterol    S/P angioplasty with stent    Skin cancer  of nose    Coronary atherosclerosis of native coronary artery 08/06/2013   Edema 08/06/2013   Osteoarthritis of both knees 01/03/2013   Resolved Ambulatory Problems    Diagnosis Date Noted   No Resolved Ambulatory Problems   Past Medical History:  Diagnosis Date   Diabetes mellitus    SOCIAL HX:  Social History   Tobacco Use   Smoking status: Never   Smokeless tobacco: Never  Substance Use Topics   Alcohol use: No   FAMILY HX:  Family History  Problem Relation Age of Onset   Hypertension Brother    Heart attack Mother    Stroke Neg Hx       ALLERGIES:  Allergies  Allergen Reactions   Codeine Rash    Angioedema (ALLERGY/intolerance) Patient stated, " I don't remember what happens. I think its a body  rash."   Lipitor [Atorvastatin Calcium] Rash    Angioedema (ALLERGY/intolerance) Patient stated, " I don't remember what happens. I think its a body rash."   Niaspan [Niacin Er] Rash    Angioedema (ALLERGY/intolerance) Patient stated, " I don't remember what happens. I think its a body rash."   Ticlid [Ticlopidine Hcl] Rash    Angioedema (ALLERGY/intolerance) Pt. Stated, " I can't remember what happens. I think its a body rash."   Zocor [Simvastatin - High Dose] Rash    Angioedema (ALLERGY/intolerance) Patient stated, " I don't remember what happens. I think its a body rash."     PERTINENT MEDICATIONS:  Outpatient Encounter Medications as of 06/28/2021  Medication Sig   acarbose (PRECOSE) 25 MG tablet Take 25 mg by mouth every evening.    acetaminophen (TYLENOL) 500 MG tablet Take 1 tablet (500 mg total) by mouth every 4 (four) hours as needed for moderate pain.   Artificial Tear Ointment (ARTIFICIAL TEARS) ointment Place 1 drop into both eyes as needed (AS DIRECTED FOR DRY EYES).    aspirin 81 MG tablet Take 81 mg by mouth daily.   cycloSPORINE (RESTASIS) 0.05 % ophthalmic emulsion Place 1 drop into both eyes 2 (two) times daily. Use for dry eyes.   dorzolamide-timolol (COSOPT) 22.3-6.8 MG/ML ophthalmic solution Place 1 drop into both eyes 2 (two) times daily. Use for glaucoma   doxycycline (ADOXA) 100 MG tablet Take 100 mg by mouth 2 (two) times daily.   Eyelid Cleansers (OCUSOFT LID SCRUB EX) Apply topically 2 (two) times daily. As directed   latanoprost (XALATAN) 0.005 % ophthalmic solution Place 1 drop into both eyes at bedtime.    metFORMIN (GLUCOPHAGE) 1000 MG tablet Take 1,000 mg by mouth 2 (two) times daily with a meal.   Multiple Vitamin (MULTIVITAMIN) capsule Take 1 capsule by mouth daily. Patient takes Centrum Silver A-Z   pioglitazone (ACTOS) 45 MG tablet Take 45 mg by mouth daily.   rosuvastatin (CRESTOR) 10 MG tablet Take 10 mg by mouth daily.   TRAMADOL HCL PO Take by  mouth.    No facility-administered encounter medications on file as of 06/28/2021.   Thank you for the opportunity to participate in the care of Ms. Duren.  The palliative care team will continue to follow. Please call our office at [phone] if we can be of additional assistance.   Beyounce Dickens, DO ,   COVID-19 PATIENT SCREENING TOOL Asked and negative response unless otherwise noted:  Have you had symptoms of covid, tested positive or been in contact with someone with symptoms/positive test in the past 5-10 days? no

## 2021-08-11 ENCOUNTER — Other Ambulatory Visit: Payer: Medicare Other | Admitting: Internal Medicine

## 2021-08-11 ENCOUNTER — Other Ambulatory Visit: Payer: Self-pay

## 2021-08-11 DIAGNOSIS — R413 Other amnesia: Secondary | ICD-10-CM

## 2021-08-11 DIAGNOSIS — Z515 Encounter for palliative care: Secondary | ICD-10-CM

## 2021-08-11 DIAGNOSIS — H401194 Primary open-angle glaucoma, unspecified eye, indeterminate stage: Secondary | ICD-10-CM

## 2021-08-11 DIAGNOSIS — R54 Age-related physical debility: Secondary | ICD-10-CM

## 2021-08-11 NOTE — Progress Notes (Signed)
[facility] Follow-Up Visit Telephone: [phone]  Fax: [phone]   Date of encounter: 08/11/21 10:15 AM PATIENT NAME: Jennifer Gordon [address] [address]   [phone] (home)  DOB: September 08, 1921 MRN: [id number] PRIMARY CARE PROVIDER:    Hulan Fess, MD,  [place] [address] [phone]  REFERRING PROVIDER:   Hulan Fess, MD [place],  [place] [address] [phone]  RESPONSIBLE PARTY:    Contact Information     Name Relation Home Work [place] Friend [phone]          I met face to face with patient and family in her home. Palliative Care was asked to follow this patient by consultation request of  Little, Lennette Bihari, MD to address advance care planning and complex medical decision making. This is follow-up visit.                                     ASSESSMENT AND PLAN / RECOMMENDATIONS:   Advance Care Planning/Goals of Care: Goals include to maximize quality of life and symptom management. Patient/health care surrogate gave his/her permission to discuss.Our advance care planning conversation included a discussion about:    The value and importance of advance care planning  Experiences with loved ones who have been seriously ill or have died  Exploration of personal, cultural or spiritual beliefs that might influence medical decisions  Exploration of goals of care in the event of a sudden injury or illness  Identification  of a healthcare agent  Review and updating or creation of an  advance directive document . Decision not to resuscitate or to de-escalate disease focused treatments due to poor prognosis. CODE STATUS:  MOST completed today with pt and son, Jennifer Gordon, who is HCPOA:  DNR, comfort measures (pt would like to avoid any hospitalizations),  abx if indicated, IVF for defined trial if it will be helpful to improve her condition at the time,  no tube feeding.    Symptom Management/Plan: 1. Memory impairment -seems this is gradually progressing, she does have moderate neurocognitive disorder at this point with short-term memory loss and requiring family and caregiver support for medications, support for adls though she can participate in the process with cues and instructions -continue well-organized plan for care and support b/w son, best friend and hired caregivers  2. Primary open-angle glaucoma, indeterminate stage, unspecified laterality -also limiting ability to manage--requires quite a bit of light and larger writing to see well--did educate that some of the tools may need to be revised as time goes on if vision is deteriorating  3. 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 Palliative care by specialist -completed MOST today, reviewed other documents family has and answered any questions posed  Follow up Palliative Care Visit: Palliative care will continue to follow for complex medical decision making, advance care planning, and clarification of goals. Return 09/13/2021 and prn.  This visit was coded based on medical decision making (MDM).33 minutes spent on ACP completing MOST and reviewing details of what each section means  PPS: 40%  HOSPICE ELIGIBILITY/DIAGNOSIS: TBD  Chief Complaint: Follow-up palliative visit  HISTORY OF PRESENT ILLNESS:  Jennifer Gordon is a 88 y.o. year old female with cognitive  impairment, glaucoma, and frailty at advanced age (also htn, cad s/p angioplasty, angina, atrophic vaginitis, dry eyes, DMII, hyperlipidemia, osteoporosis on prolia and right carotid stenosis) seen in palliative care follow-up for MOST completion and further discussion about DNR.  She's got excellent support from her son, Jennifer Gordon, and best friend, Jennifer Gordon.  She uses a rollator for  mobility.  See initial consult for details about daily in home support/adl help.  No significant changes reported since last visit.  History obtained from review of EMR, discussion with primary team, and interview with family, facility staff/caregiver and/or Jennifer Gordon.   I reviewed available labs, medications, imaging, studies and related documents from the EMR.  Records reviewed and summarized above.   ROS  General: NAD EYES: denies vision changes though brighter light needed and larger print at distances ENMT: denies dysphagia Cardiovascular: denies chest pain, denies DOE Pulmonary: denies cough, denies increased SOB Abdomen: endorses good appetite--eats very little meat and small portions, denies constipation, endorses continence of bowel GU: denies dysuria, endorses some incontinence of urine MSK:  denies increased weakness,  no falls reported but balance poor and uses walker Skin: denies rashes or wounds Neurological: reports arthritic pain and soreness/tingling of legs/feet when down, denies insomnia Psych: Endorses positive mood Heme/lymph/immuno: denies bruises, abnormal bleeding  Physical Exam: Current and past weights:  10/2019 she weighed 133 lbs with bmi 24 Constitutional: NAD General: frail appearing, thin EYES: anicteric sclera, lids intact, no discharge, wears glasses ENMT: hard of hearing, oral mucous membranes moist, dentition intact CV: S1S2, RRR, no LE edema Pulmonary: LCTA, no increased work of breathing, no cough, room air Abdomen: intake 50%, of what it once was--small portions and skips meat most of the time, normo-active BS + 4 quadrants, soft and non tender, no ascites GU: deferred MSK: mild sarcopenia, moves all extremities, ambulatory with rollator Skin: warm and dry, no rashes or wounds on visible skin Neuro:  no generalized weakness,  moderate cognitive impairment Psych: non-anxious affect, A and O x 3--looks at digital clock, repeats herself  often Hem/lymph/immuno: no widespread bruising  CURRENT PROBLEM LIST:  Patient Active Problem List   Diagnosis Date Noted   Age-related osteoporosis without current pathological fracture 06/28/2021   Adjustment disorder with depressed mood 06/28/2021   Memory impairment 06/28/2021   Angina pectoris ([facility]) 06/28/2021   Atrophic vaginitis 06/28/2021   Glaucoma 06/28/2021   Microalbuminuria 06/28/2021   Occlusion and stenosis of right carotid artery 06/28/2021   Tear film insufficiency 06/28/2021   Type 2 diabetes mellitus with unspecified complications ([facility]) 26/83/4196   Atherosclerotic heart disease of native coronary artery without angina pectoris 08/06/2013   Edema 08/06/2013   Hypertension    Mixed hyperlipidemia    S/P angioplasty with stent    Skin cancer of nose    Osteoarthritis of both knees 01/03/2013   PAST MEDICAL HISTORY:  Active Ambulatory Problems    Diagnosis Date Noted   Hypertension    Mixed hyperlipidemia    S/P angioplasty with stent    Skin cancer of nose    Atherosclerotic heart disease of native coronary artery without angina pectoris 08/06/2013   Edema 08/06/2013   Osteoarthritis of both knees 01/03/2013   Age-related osteoporosis without current pathological fracture 06/28/2021   Adjustment disorder with depressed mood 06/28/2021   Memory impairment 06/28/2021   Angina pectoris ([facility]) 06/28/2021   Atrophic vaginitis 06/28/2021   Glaucoma 06/28/2021   Microalbuminuria 06/28/2021   Occlusion and stenosis of right carotid artery 06/28/2021   Tear  film insufficiency 06/28/2021   Type 2 diabetes mellitus with unspecified complications ([facility]) 56/21/3086   Resolved Ambulatory Problems    Diagnosis Date Noted   No Resolved Ambulatory Problems   Past Medical History:  Diagnosis Date   Diabetes mellitus    High cholesterol    SOCIAL HX:  Social History   Tobacco Use   Smoking status: Never   Smokeless tobacco: Never  Substance Use Topics    Alcohol use: No     ALLERGIES:  Allergies  Allergen Reactions   Atorvastatin Other (See Comments)   Nsaids Other (See Comments)   Cephalexin Rash   Codeine Rash    Angioedema (ALLERGY/intolerance) Patient stated, " I don't remember what happens. I think its a body rash."   Ibandronic Acid Rash   Lipitor [Atorvastatin Calcium] Rash    Angioedema (ALLERGY/intolerance) Patient stated, " I don't remember what happens. I think its a body rash."   Niaspan [Niacin Er] Rash    Angioedema (ALLERGY/intolerance) Patient stated, " I don't remember what happens. I think its a body rash."   Nitrofurantoin Rash   Ticlid [Ticlopidine Hcl] Rash    Angioedema (ALLERGY/intolerance) Pt. Stated, " I can't remember what happens. I think its a body rash."   Zocor [Simvastatin - High Dose] Rash    Angioedema (ALLERGY/intolerance) Patient stated, " I don't remember what happens. I think its a body rash."     PERTINENT MEDICATIONS:  Outpatient Encounter Medications as of 08/11/2021  Medication Sig   acarbose (PRECOSE) 25 MG tablet Take 25 mg by mouth every evening.    acetaminophen (TYLENOL) 500 MG tablet Take 1 tablet (500 mg total) by mouth every 4 (four) hours as needed for moderate pain.   Artificial Tear Ointment (ARTIFICIAL TEARS) ointment Place 1 drop into both eyes as needed (AS DIRECTED FOR DRY EYES).    aspirin 81 MG tablet Take 81 mg by mouth daily.   cycloSPORINE (RESTASIS) 0.05 % ophthalmic emulsion Place 1 drop into both eyes 2 (two) times daily. Use for dry eyes.   dorzolamide-timolol (COSOPT) 22.3-6.8 MG/ML ophthalmic solution Place 1 drop into both eyes 2 (two) times daily. Use for glaucoma   doxycycline (ADOXA) 100 MG tablet Take 100 mg by mouth 2 (two) times daily.   Eyelid Cleansers (OCUSOFT LID SCRUB EX) Apply topically 2 (two) times daily. As directed   latanoprost (XALATAN) 0.005 % ophthalmic solution Place 1 drop into both eyes at bedtime.    metFORMIN (GLUCOPHAGE) 1000 MG tablet  Take 1,000 mg by mouth 2 (two) times daily with a meal.   Multiple Vitamin (MULTIVITAMIN) capsule Take 1 capsule by mouth daily. Patient takes Centrum Silver A-Z   pioglitazone (ACTOS) 45 MG tablet Take 45 mg by mouth daily.   rosuvastatin (CRESTOR) 10 MG tablet Take 10 mg by mouth daily.   TRAMADOL HCL PO Take by mouth.    No facility-administered encounter medications on file as of 08/11/2021.   Thank you for the opportunity to participate in the care of Ms. Sample.  The palliative care team will continue to follow. Please call our office at [phone] if we can be of additional assistance.   Hollace Kinnier, DO  COVID-19 PATIENT SCREENING TOOL Asked and negative response unless otherwise noted:  Have you had symptoms of covid, tested positive or been in contact with someone with symptoms/positive test in the past 5-10 days? no

## 2021-08-28 ENCOUNTER — Encounter: Payer: Self-pay | Admitting: Internal Medicine

## 2021-09-06 ENCOUNTER — Ambulatory Visit: Payer: Medicare Other | Admitting: Podiatry

## 2021-09-06 DIAGNOSIS — B351 Tinea unguium: Secondary | ICD-10-CM | POA: Diagnosis not present

## 2021-09-06 DIAGNOSIS — E119 Type 2 diabetes mellitus without complications: Secondary | ICD-10-CM

## 2021-09-06 DIAGNOSIS — M79674 Pain in right toe(s): Secondary | ICD-10-CM

## 2021-09-06 DIAGNOSIS — M79675 Pain in left toe(s): Secondary | ICD-10-CM | POA: Diagnosis not present

## 2021-09-12 NOTE — Progress Notes (Signed)
Subjective: ?88 y.o. returns the office today for painful, elongated, thickened toenails which she cannot trim herself. Denies any redness or drainage around the nails.  No open lesions and no recent injury or changes otherwise since I last saw her. ? ?PCP: Hulan Fess, MD ? ?Objective: ?NAD ?DP/PT pulses palpable, CRT less than 3 seconds ?Nails hypertrophic, dystrophic, elongated, brittle, discolored ?10. There is tenderness overlying the nails 1-5 bilaterally.  Mild incurvation of the hallux nails but no edema, erythema and signs of infection.  No drainage. ?No open lesions  ?No pain with calf compression, swelling, warmth, erythema. ? ?Assessment: ?Patient presents with symptomatic onychomycosis ? ?Plan: ?-Treatment options including alternatives, risks, complications were discussed ?-Nails sharply debrided ?10 without complication/bleeding.   ?-Discussed daily foot inspection. If there are any changes, to call the office immediately.  ?-Follow-up in 9 weeks or sooner if any problems are to arise. In the meantime, encouraged to call the office with any questions, concerns, changes symptoms. ? ?Celesta Gentile, DPM ? ? ?

## 2021-09-13 ENCOUNTER — Other Ambulatory Visit: Payer: Medicare Other | Admitting: Internal Medicine

## 2021-09-13 VITALS — BP 132/64 | HR 74

## 2021-09-13 DIAGNOSIS — F02B0 Dementia in other diseases classified elsewhere, moderate, without behavioral disturbance, psychotic disturbance, mood disturbance, and anxiety: Secondary | ICD-10-CM

## 2021-09-13 DIAGNOSIS — R54 Age-related physical debility: Secondary | ICD-10-CM

## 2021-09-13 DIAGNOSIS — Z515 Encounter for palliative care: Secondary | ICD-10-CM

## 2021-09-13 DIAGNOSIS — H401194 Primary open-angle glaucoma, unspecified eye, indeterminate stage: Secondary | ICD-10-CM

## 2021-09-13 NOTE — Progress Notes (Signed)
[facility] Palliative Care Follow-Up Visit Telephone: [phone]  Fax: [phone]   Date of encounter: 09/13/21 2:03 PM PATIENT NAME: Jennifer Gordon [address] [phone]   [phone] (home)  DOB: 05/05/1922 MRN: [id number] PRIMARY CARE PROVIDER:    Hulan Fess, MD,  [place] [address] [phone]  REFERRING PROVIDER:   Hulan Fess, [place],  [place] [address] [phone]  RESPONSIBLE PARTY:    Contact Information     Name Relation Home Work [place] Friend [phone]          I met face to face with patient and family (son, Jennifer Gordon, and caregiver, Helene Kelp, in her home. Palliative Care was asked to follow this patient by consultation request of  Little, Lennette Bihari, MD to address advance care planning and complex medical decision making. This is follow-up visit.                                     ASSESSMENT AND PLAN / RECOMMENDATIONS:   Advance Care Planning/Goals of Care: Goals include to maximize quality of life and symptom management. Patient/health care surrogate gave his/her permission to discuss.Our advance care planning conversation included a discussion about:    Goal to stay in the home with caregiver assistance long-term Identification  of a healthcare agent--son, Jennifer Gordon Decision not to resuscitate or to de-escalate disease focused treatments due to poor prognosis. CODE STATUS:  DNR; MOST previously completed and on file in [facility]  Symptom Management/Plan: 1. Moderate major neurocognitive disorder due to Alzheimer's disease without behavioral disturbance ([place]) -is progressing with more challenges following written and verbal instructions in recent past (month, especially two weeks) but no new localizing symptoms or signs of delirium  -planned to be sure she'd eaten her evening meal by having her check the fridge when Jennifer Gordon talks with her in the  evening like he does with her pills -continue caregiver plan as is for now, but may need night caregiving if this does not solve the missed meals and if unable to use checklist for medications    2. Frailty syndrome in geriatric patient -reintroduce meats finely chopped as tolerated since tooth now removed--see if she can eat them--suggested adding small bits of the lunch meats chopped to the omelette -continue rollator walker -cont caregiver help with shower weekly, but pt does otherwise get herself ready though there are some developing challenges  3. Primary open-angle glaucoma, indeterminate stage, unspecified laterality -continue current drops and adequate lighting--seems difficulty with her medication list is more comprehension and performing task than it is visual  4. Palliative care by specialist -discussed potential increased caregiving hours with Jennifer Gordon and current staff member -continue to follow over time for further education and support   Follow up Palliative Care Visit: Palliative care will continue to follow for complex medical decision making, advance care planning, and clarification of goals. Return 6-8 weeks or prn.  22-Nov-2021  This visit was coded based on medical decision making (MDM).  PPS: 40%  HOSPICE ELIGIBILITY/DIAGNOSIS: Not at this time/Alzheimer's potentially  Chief Complaint: Follow-up palliative visit  HISTORY OF PRESENT ILLNESS:  Jennifer Gordon is a 88 y.o. year old female  with moderate major neurocognitive disorder due to AD without behaviors, DMII, glaucoma, carotid artery stenosis, CAD, angina, OA of both knees, osteoporosis, atrophic vaginitis, venous insufficiency, and adjustment disorder with depressed  mood seen for palliative care follow-up.  She has had some decline since our last visit 4-6 wks ago.  Her son and caregiver articulate that she has been forgetting her medications more often--then struggling to follow the instructions written for her to do  this.  She has also missed her evening meal at least on two occasions in the past month.  Helene Kelp notes that patient will obsess over things like upcoming appts and she cannot be distracted from the topic so the caregivers and family must be very patient and repeat themselves often.  Pt also had one episode where she was sitting on her bed in wet pants and didn't know what to do at the time when Helene Kelp arrived for her shift at 1130am.  Suspicion is that patient slept in her clothes that night rather than changing into nightclothes.  She knows how to change her depends and where they are but didn't know what to do when her pants were also soaked. She's forgetting if she's eaten or not.    She does eat very well when meals are prepared for her and she remembers.  She has a cheese omelette with cranberry sauce, potato salad each day and today Helene Kelp had added a side of strawberries and avocado.  The caregivers prep her supper and leave it in the fridge with microwave instructions on top (? If still able to follow these with recent challenges).  Her son does call to remind her to eat in the evening, but despite this, apparently she still did not (must have forgotten to and thought she did).  She is now unable to operate the controls on her recliner/lift chair (notably it's a new controller, but she does not like that it lies her back so much either).  Jennifer Gordon compares his mother to a computer and if it crashes, nothing will work--she will fixate on one thing and can't get past it.   He also had a dental appt and it turned out she had a bad tooth that was apparently quite painful--this was extracted so Jennifer Gordon is hoping she will be able to chew better and will start eating meats along with her other food (she'd been skipping the meat in her evening meal and it wasn't part of her large breakfast above).    She had a podiatry appt due to some bleeding from one of her toes and this was unremarkable.    There will be a  change in her schedule b/c the one caregiver will be out for surgery potentially so Jennifer Gordon is stepping in three days per week to assume her role.    One hearing aide is lost and she was struggling more to hear than usual today while wearing just one.  We need to be close to her and annunciate clearly.  Pt has a list of her meds to check off as she takes them (recently with more assistance from [facility] over the phone b/c she's less often done this ahead on her own).  She also has the list of the schedule of caregivers so she can be abreast of what is going on--she continually asks if she cannot hear our discussion.    Birdie Sons, gives her shower.    Pt also adjusted the thermostat inappropriately on two occasions--once to 62 so it was really chilly when caregiver arrived and once to where it was stifling hot.    BP has run 120s-130s/60s per weekly checks by caregivers.  History obtained from review of  EMR, discussion with primary team, and interview with family, facility staff/caregiver and/or Ms. Rondon.  I reviewed available labs, medications, imaging, studies and related documents from the EMR.  Records reviewed and summarized above.   ROS  General: NAD EYES: denies vision changes--has glaucoma and needs brighter light to see well ENMT: denies dysphagia, just had tooth extracted on bottom right in past month Cardiovascular: denies chest pain, denies DOE, has some soft swelling of lower legs, less in feet Pulmonary: denies cough, denies increased SOB Abdomen: endorses good appetite, denies constipation, endorses continence of bowel GU: denies dysuria, endorses incontinence of urine MSK:  denies increased weakness,  no falls reported, uses rollator Skin: denies rashes or wounds Neurological: denies pain, denies insomnia Psych: Endorses positive mood but worries a lot and obsesses over things Heme/lymph/immuno:easily bruises, but no abnormal bleeding  Physical Exam: Current and past  weights:  not weighed at podiatry; need recent weight Constitutional: NAD General: frail appearing, thin EYES: anicteric sclera, lids intact, no discharge, not wearing her glasses today ENMT: hard of hearing and only using right hearing aide as left lost, oral mucous membranes moist, dentition intact except one tooth on bottom right just extracted in past month CV: F7C9, 2/6 systolic murmur, soft LE edema with some pitting from sock line only, tender lower legs Pulmonary: LCTA, no increased work of breathing, no cough, room air Abdomen: intake 100% as long as she remembers to eat, normo-active BS + 4 quadrants, soft and non tender, no ascites GU: deferred MSK: sarcopenia, moves all extremities, ambulatory with rollator Skin: warm and dry, no rashes or wounds on visible skin Neuro:  no generalized weakness,  moderate cognitive impairment--see hpi for changes Psych: anxious affect, A and O x 2, repeats questions every few minutes Hem/lymph/immuno: no widespread bruising  CURRENT PROBLEM LIST:  Patient Active Problem List   Diagnosis Date Noted   Age-related osteoporosis without current pathological fracture 06/28/2021   Adjustment disorder with depressed mood 06/28/2021   Memory impairment 06/28/2021   Angina pectoris ([place]) 06/28/2021   Atrophic vaginitis 06/28/2021   Glaucoma 06/28/2021   Microalbuminuria 06/28/2021   Occlusion and stenosis of right carotid artery 06/28/2021   Tear film insufficiency 06/28/2021   Type 2 diabetes mellitus with unspecified complications ([place]) 44/96/7591   Atherosclerotic heart disease of native coronary artery without angina pectoris 08/06/2013   Edema 08/06/2013   Hypertension    Mixed hyperlipidemia    S/P angioplasty with stent    Skin cancer of nose    Osteoarthritis of both knees 01/03/2013   PAST MEDICAL HISTORY:  Active Ambulatory Problems    Diagnosis Date Noted   Hypertension    Mixed hyperlipidemia    S/P angioplasty with stent     Skin cancer of nose    Atherosclerotic heart disease of native coronary artery without angina pectoris 08/06/2013   Edema 08/06/2013   Osteoarthritis of both knees 01/03/2013   Age-related osteoporosis without current pathological fracture 06/28/2021   Adjustment disorder with depressed mood 06/28/2021   Memory impairment 06/28/2021   Angina pectoris ([place]) 06/28/2021   Atrophic vaginitis 06/28/2021   Glaucoma 06/28/2021   Microalbuminuria 06/28/2021   Occlusion and stenosis of right carotid artery 06/28/2021   Tear film insufficiency 06/28/2021   Type 2 diabetes mellitus with unspecified complications ([place]) 63/84/6659   Resolved Ambulatory Problems    Diagnosis Date Noted   No Resolved Ambulatory Problems   Past Medical History:  Diagnosis Date   Diabetes mellitus    High  cholesterol    SOCIAL HX:  Social History   Tobacco Use   Smoking status: Never   Smokeless tobacco: Never  Substance Use Topics   Alcohol use: No     ALLERGIES:  Allergies  Allergen Reactions   Atorvastatin Other (See Comments)   Nsaids Other (See Comments)   Cephalexin Rash   Codeine Rash    Angioedema (ALLERGY/intolerance) Patient stated, " I don't remember what happens. I think its a body rash."   Ibandronic Acid Rash   Lipitor [Atorvastatin Calcium] Rash    Angioedema (ALLERGY/intolerance) Patient stated, " I don't remember what happens. I think its a body rash."   Niaspan [Niacin Er] Rash    Angioedema (ALLERGY/intolerance) Patient stated, " I don't remember what happens. I think its a body rash."   Nitrofurantoin Rash   Ticlid [Ticlopidine Hcl] Rash    Angioedema (ALLERGY/intolerance) Pt. Stated, " I can't remember what happens. I think its a body rash."   Zocor [Simvastatin - High Dose] Rash    Angioedema (ALLERGY/intolerance) Patient stated, " I don't remember what happens. I think its a body rash."     PERTINENT MEDICATIONS:  Outpatient Encounter Medications as of 09/13/2021   Medication Sig   acarbose (PRECOSE) 25 MG tablet Take 25 mg by mouth every evening.    acetaminophen (TYLENOL) 500 MG tablet Take 1 tablet (500 mg total) by mouth every 4 (four) hours as needed for moderate pain.   Artificial Tear Ointment (ARTIFICIAL TEARS) ointment Place 1 drop into both eyes as needed (AS DIRECTED FOR DRY EYES).    aspirin 81 MG tablet Take 81 mg by mouth daily.   cycloSPORINE (RESTASIS) 0.05 % ophthalmic emulsion Place 1 drop into both eyes 2 (two) times daily. Use for dry eyes.   dorzolamide-timolol (COSOPT) 22.3-6.8 MG/ML ophthalmic solution Place 1 drop into both eyes 2 (two) times daily. Use for glaucoma   doxycycline (ADOXA) 100 MG tablet Take 100 mg by mouth 2 (two) times daily.   Eyelid Cleansers (OCUSOFT LID SCRUB EX) Apply topically 2 (two) times daily. As directed   latanoprost (XALATAN) 0.005 % ophthalmic solution Place 1 drop into both eyes at bedtime.    metFORMIN (GLUCOPHAGE) 1000 MG tablet Take 1,000 mg by mouth 2 (two) times daily with a meal.   Multiple Vitamin (MULTIVITAMIN) capsule Take 1 capsule by mouth daily. Patient takes Centrum Silver A-Z   pioglitazone (ACTOS) 45 MG tablet Take 45 mg by mouth daily.   rosuvastatin (CRESTOR) 10 MG tablet Take 10 mg by mouth daily.   TRAMADOL HCL PO Take by mouth.    No facility-administered encounter medications on file as of 09/13/2021.   Thank you for the opportunity to participate in the care of Ms. Kramlich.  The palliative care team will continue to follow. Please call our office at [phone] if we can be of additional assistance.   Hollace Kinnier, DO  COVID-19 PATIENT SCREENING TOOL Asked and negative response unless otherwise noted:  Have you had symptoms of covid, tested positive or been in contact with someone with symptoms/positive test in the past 5-10 days? no

## 2021-09-14 ENCOUNTER — Encounter: Payer: Self-pay | Admitting: Internal Medicine

## 2021-09-14 DIAGNOSIS — D72819 Decreased white blood cell count, unspecified: Secondary | ICD-10-CM | POA: Insufficient documentation

## 2021-09-14 DIAGNOSIS — N949 Unspecified condition associated with female genital organs and menstrual cycle: Secondary | ICD-10-CM | POA: Insufficient documentation

## 2021-09-26 DIAGNOSIS — R809 Proteinuria, unspecified: Secondary | ICD-10-CM | POA: Diagnosis not present

## 2021-09-26 DIAGNOSIS — R413 Other amnesia: Secondary | ICD-10-CM | POA: Diagnosis not present

## 2021-09-26 DIAGNOSIS — H04129 Dry eye syndrome of unspecified lacrimal gland: Secondary | ICD-10-CM | POA: Diagnosis not present

## 2021-09-26 DIAGNOSIS — E782 Mixed hyperlipidemia: Secondary | ICD-10-CM | POA: Diagnosis not present

## 2021-09-26 DIAGNOSIS — H409 Unspecified glaucoma: Secondary | ICD-10-CM | POA: Diagnosis not present

## 2021-09-26 DIAGNOSIS — I25119 Atherosclerotic heart disease of native coronary artery with unspecified angina pectoris: Secondary | ICD-10-CM | POA: Diagnosis not present

## 2021-09-26 DIAGNOSIS — I10 Essential (primary) hypertension: Secondary | ICD-10-CM | POA: Diagnosis not present

## 2021-09-26 DIAGNOSIS — M810 Age-related osteoporosis without current pathological fracture: Secondary | ICD-10-CM | POA: Diagnosis not present

## 2021-09-26 DIAGNOSIS — E118 Type 2 diabetes mellitus with unspecified complications: Secondary | ICD-10-CM | POA: Diagnosis not present

## 2021-10-06 DIAGNOSIS — N3000 Acute cystitis without hematuria: Secondary | ICD-10-CM | POA: Diagnosis not present

## 2021-11-03 ENCOUNTER — Other Ambulatory Visit: Payer: Medicare Other | Admitting: Internal Medicine

## 2021-11-03 VITALS — BP 150/82

## 2021-11-03 DIAGNOSIS — Z515 Encounter for palliative care: Secondary | ICD-10-CM

## 2021-11-03 DIAGNOSIS — F02B0 Dementia in other diseases classified elsewhere, moderate, without behavioral disturbance, psychotic disturbance, mood disturbance, and anxiety: Secondary | ICD-10-CM

## 2021-11-03 DIAGNOSIS — R54 Age-related physical debility: Secondary | ICD-10-CM

## 2021-11-03 DIAGNOSIS — G309 Alzheimer's disease, unspecified: Secondary | ICD-10-CM

## 2021-11-03 DIAGNOSIS — I10 Essential (primary) hypertension: Secondary | ICD-10-CM

## 2021-11-06 ENCOUNTER — Encounter: Payer: Self-pay | Admitting: Internal Medicine

## 2021-11-06 NOTE — Progress Notes (Signed)
[facility] Follow-Up Visit Telephone: [phone]  Fax: [phone]   Date of encounter: 11/06/21 3:29 PM PATIENT NAME: Jennifer Gordon [address] [id number]   [phone] (home)  DOB: [birth year] MRN: [id number] PRIMARY CARE PROVIDER:    Hulan Fess, MD,  [place] [address] [phone]  REFERRING PROVIDER:   Hulan Fess, MD [place],  Jennifer Gordon [address] [phone]  RESPONSIBLE PARTY:    Contact Information     Name Relation Home Work [place] Friend [phone]          I met face to face with patient and family in her home/facility. Palliative Care was asked to follow this patient by consultation request of  Little, Lennette Bihari, MD to address advance care planning and complex medical decision making. This is follow-up visit.                                     ASSESSMENT AND PLAN / RECOMMENDATIONS:   Advance Care Planning/Goals of Care: Goals include to maximize quality of life and symptom management. Patient/health care surrogate gave his/her permission to discuss.Our advance care planning conversation included a discussion about:    The value and importance of advance care planning  Experiences with loved ones who have been seriously ill or have died  Exploration of personal, cultural or spiritual beliefs that might influence medical decisions  Exploration of goals of care in the event of a sudden injury or illness  Identification  of a healthcare agent  Review and updating or creation of an  advance directive document . Decision not to resuscitate or to de-escalate disease focused treatments due to poor prognosis. CODE STATUS:  DNR, MOST on file, comfort measures  Symptom Management/Plan: 1. Moderate major neurocognitive disorder due to Alzheimer's disease without behavioral disturbance ([place]) -occasional missed meds and evening meals that  require intervention by her son with calls to ensure she eats or takes pills/drops -very systematic care in place -continue to monitor  2. Essential hypertension -bp elevated often in the past month running 256L-893T systolic now -may need to go back on meds if persists so asked them to monitor 3x per week instead of just weekly (challenges getting to eat enough so don't want to ask her to restrict her diet--loves salty meat products like hot dogs and going out with her friend/caregiver) to high sodium food places  3. Frailty syndrome in geriatric patient -less mobile and eats less at her advanced age -cont caregiver and son's support to keep her in her home as desired  4. Palliative care by specialist -continue to follow every few months to monitor for progression and provide education and support    Follow up Palliative Care Visit: Palliative care will continue to follow for complex medical decision making, advance care planning, and clarification of goals. Return 02/02/2022 and prn.   This visit was coded based on medical decision making (MDM).  PPS: weak 50%  HOSPICE ELIGIBILITY/DIAGNOSIS: TBD  Chief Complaint: Follow-up palliative visit  HISTORY OF PRESENT ILLNESS:  Jennifer Gordon is a 88 y.o. year old female  with dementia, falls, kyphoscoliosis, venous insufficiency, htn, and frailty seen for palliative f/u.  Her son and one of her caregivers was present.  They report she's had 1-2 episodes of missed dinner (but goes out with a friend 1sometimes also and  may not be able to remember when she did).  One night she couldn't find her checklist that she uses to go through her meds, eyedrops, eating and all tasks and missed her dinner that night even with her son on the phone with her.  She has missed eye drops a few times.  She's 50/50 in the am about whether she's done her meds and drops or needs to be reminded which is a decline from what it's been.  Jennifer Gordon worked on her chair, but she  still doesn't elevate her feet unless reminded.  She had an eye appt and distance rx could be increased, btu she opted not to as she sees the tv well and does not do other distance items.  Gets spurts of blurry vision at timees.  Also has challenges operating the remote due to a delay in response.  Neighbor has been coming to help.  She uses the microwave well with directions and eats her big breakfast the caregivers provide.  She sleeps well.  Jennifer Gordon/friend/caregiver comes to help with showers and me refills on Fridays and one other day of the week . Also takes her to dinner.  She has not had any incontinence spells since last visit that Jennifer Gordon and this caregiver were aware of.  History obtained from review of EMR, discussion with primary team, and interview with family, facility staff/caregiver and/or Jennifer Gordon.  I reviewed available labs, medications, imaging, studies and related documents from the EMR.  Records reviewed and summarized above.   ROS Review of Systems  Constitutional:  Negative for activity change, appetite change, chills, fever and unexpected weight change.  HENT:  Negative for congestion and trouble swallowing.   Eyes:        Blurry vision episodes  Respiratory:  Negative for chest tightness and shortness of breath.   Cardiovascular:  Positive for leg swelling. Negative for chest pain and palpitations.  Gastrointestinal:  Negative for abdominal pain.  Genitourinary:  Negative for dysuria.  Musculoskeletal:  Positive for gait problem. Negative for arthralgias.  Neurological:  Positive for weakness. Negative for dizziness.  Hematological:  Bruises/bleeds easily.  Psychiatric/Behavioral:  Positive for confusion. Negative for agitation and sleep disturbance.     Physical Exam: Vitals:   11/06/21 1528  BP: (!) 150/82   There is no height or weight on file to calculate BMI. Wt Readings from Last 500 Encounters:  11/10/19 133 lb (60.3 kg)  07/12/17 140 lb (63.5 kg)   04/20/17 135 lb (61.2 kg)  11/07/16 146 lb (66.2 kg)  10/06/15 158 lb (71.7 kg)  09/22/15 158 lb (71.7 kg)  09/30/14 160 lb (72.6 kg)  08/12/14 174 lb (78.9 kg)  08/06/13 173 lb 9.6 oz (78.7 kg)   Physical Exam Vitals reviewed.  Constitutional:      Appearance: Normal appearance.  HENT:     Head: Normocephalic and atraumatic.  Cardiovascular:     Rate and Rhythm: Normal rate and regular rhythm.     Pulses: Normal pulses.     Heart sounds: Normal heart sounds.  Pulmonary:     Effort: Pulmonary effort is normal.     Breath sounds: Normal breath sounds.  Abdominal:     General: Bowel sounds are normal.  Musculoskeletal:        General: Normal range of motion.     Right lower leg: Edema present.     Left lower leg: Edema present.     Comments: Nonpitting edema of ankles,  Skin:  General: Skin is warm and dry.     Coloration: Skin is pale.  Neurological:     General: No focal deficit present.     Mental Status: She is alert and oriented to person, place, and time.     CURRENT PROBLEM LIST:  Patient Active Problem List   Diagnosis Date Noted   Leukopenia 09/14/2021   Disorder of female genital organs 09/14/2021   Age-related osteoporosis without current pathological fracture 06/28/2021   Adjustment disorder with depressed mood 06/28/2021   Memory impairment 06/28/2021   Angina pectoris ([place]) 06/28/2021   Atrophic vaginitis 06/28/2021   Glaucoma 06/28/2021   Microalbuminuria 06/28/2021   Occlusion and stenosis of right carotid artery 06/28/2021   Tear film insufficiency 06/28/2021   Type 2 diabetes mellitus with unspecified complications ([place]) 60/73/7106   Atherosclerotic heart disease of native coronary artery without angina pectoris 08/06/2013   Edema 08/06/2013   Hypertension    Mixed hyperlipidemia    S/P angioplasty with stent    Skin cancer of nose    Osteoarthritis of both knees 01/03/2013    PAST MEDICAL HISTORY:  Active Ambulatory Problems     Diagnosis Date Noted   Hypertension    Mixed hyperlipidemia    S/P angioplasty with stent    Skin cancer of nose    Atherosclerotic heart disease of native coronary artery without angina pectoris 08/06/2013   Edema 08/06/2013   Osteoarthritis of both knees 01/03/2013   Age-related osteoporosis without current pathological fracture 06/28/2021   Adjustment disorder with depressed mood 06/28/2021   Memory impairment 06/28/2021   Angina pectoris ([place]) 06/28/2021   Atrophic vaginitis 06/28/2021   Glaucoma 06/28/2021   Microalbuminuria 06/28/2021   Occlusion and stenosis of right carotid artery 06/28/2021   Tear film insufficiency 06/28/2021   Type 2 diabetes mellitus with unspecified complications ([place]) 26/94/8546   Leukopenia 09/14/2021   Disorder of female genital organs 09/14/2021   Resolved Ambulatory Problems    Diagnosis Date Noted   No Resolved Ambulatory Problems   Past Medical History:  Diagnosis Date   Diabetes mellitus    High cholesterol     SOCIAL HX:  Social History   Tobacco Use   Smoking status: Never   Smokeless tobacco: Never  Substance Use Topics   Alcohol use: No     ALLERGIES:  Allergies  Allergen Reactions   Atorvastatin Other (See Comments)   Nsaids Other (See Comments)   Cephalexin Rash   Codeine Rash    Angioedema (ALLERGY/intolerance) Patient stated, " I don't remember what happens. I think its a body rash."   Ibandronic Acid Rash   Lipitor [Atorvastatin Calcium] Rash    Angioedema (ALLERGY/intolerance) Patient stated, " I don't remember what happens. I think its a body rash."   Niaspan [Niacin Er] Rash    Angioedema (ALLERGY/intolerance) Patient stated, " I don't remember what happens. I think its a body rash."   Nitrofurantoin Rash   Ticlid [Ticlopidine Hcl] Rash    Angioedema (ALLERGY/intolerance) Pt. Stated, " I can't remember what happens. I think its a body rash."   Zocor [Simvastatin - High Dose] Rash    Angioedema  (ALLERGY/intolerance) Patient stated, " I don't remember what happens. I think its a body rash."      PERTINENT MEDICATIONS:  Outpatient Encounter Medications as of 11/03/2021  Medication Sig   acarbose (PRECOSE) 25 MG tablet Take 25 mg by mouth every evening.    acetaminophen (TYLENOL) 500 MG tablet Take 1 tablet (500 mg  total) by mouth every 4 (four) hours as needed for moderate pain.   Artificial Tear Ointment (ARTIFICIAL TEARS) ointment Place 1 drop into both eyes as needed (AS DIRECTED FOR DRY EYES).    ASPIRIN LOW DOSE 81 MG EC tablet Take 81 mg by mouth daily.   cromolyn (OPTICROM) 4 % ophthalmic solution 1 drop 4 (four) times daily.   cycloSPORINE (RESTASIS) 0.05 % ophthalmic emulsion Place 1 drop into both eyes 2 (two) times daily. Use for dry eyes.   denosumab (PROLIA) 60 MG/ML SOSY injection 60 mg   dorzolamide-timolol (COSOPT) 22.3-6.8 MG/ML ophthalmic solution Place 1 drop into both eyes 2 (two) times daily. Use for glaucoma   doxycycline (ADOXA) 100 MG tablet Take 100 mg by mouth 2 (two) times daily.   Eyelid Cleansers (OCUSOFT LID SCRUB EX) Apply topically 2 (two) times daily. As directed   latanoprost (XALATAN) 0.005 % ophthalmic solution Place 1 drop into both eyes at bedtime.    metFORMIN (GLUCOPHAGE) 1000 MG tablet Take 1,000 mg by mouth 2 (two) times daily with a meal.   Multiple Vitamin (MULTIVITAMIN) capsule Take 1 capsule by mouth daily. Patient takes Centrum Silver A-Z   pioglitazone (ACTOS) 45 MG tablet Take 45 mg by mouth daily.   Polyvinyl Alcohol-Povidone PF (REFRESH) 1.4-0.6 % SOLN See admin instructions.   rosuvastatin (CRESTOR) 10 MG tablet Take 10 mg by mouth daily.   TRAMADOL HCL PO Take by mouth.    No facility-administered encounter medications on file as of 11/03/2021.    Thank you for the opportunity to participate in the care of Ms. Rondon.  The palliative care team will continue to follow. Please call our office at [phone] if we can be of additional  assistance.   Hollace Kinnier, DO  COVID-19 PATIENT SCREENING TOOL Asked and negative response unless otherwise noted:  Have you had symptoms of covid, tested positive or been in contact with someone with symptoms/positive test in the past 5-10 days? no

## 2021-11-21 DIAGNOSIS — I10 Essential (primary) hypertension: Secondary | ICD-10-CM | POA: Diagnosis not present

## 2021-11-21 DIAGNOSIS — M810 Age-related osteoporosis without current pathological fracture: Secondary | ICD-10-CM | POA: Diagnosis not present

## 2021-11-21 DIAGNOSIS — E782 Mixed hyperlipidemia: Secondary | ICD-10-CM | POA: Diagnosis not present

## 2021-11-21 DIAGNOSIS — E118 Type 2 diabetes mellitus with unspecified complications: Secondary | ICD-10-CM | POA: Diagnosis not present

## 2021-11-21 DIAGNOSIS — I2510 Atherosclerotic heart disease of native coronary artery without angina pectoris: Secondary | ICD-10-CM | POA: Diagnosis not present

## 2021-12-06 ENCOUNTER — Ambulatory Visit: Payer: Medicare Other | Admitting: Podiatry

## 2021-12-06 DIAGNOSIS — M79674 Pain in right toe(s): Secondary | ICD-10-CM | POA: Diagnosis not present

## 2021-12-06 DIAGNOSIS — E119 Type 2 diabetes mellitus without complications: Secondary | ICD-10-CM

## 2021-12-06 DIAGNOSIS — M79675 Pain in left toe(s): Secondary | ICD-10-CM

## 2021-12-06 DIAGNOSIS — B351 Tinea unguium: Secondary | ICD-10-CM

## 2021-12-14 NOTE — Progress Notes (Signed)
Subjective: 88 y.o. returns the office today for painful, elongated, thickened toenails which she cannot trim herself. Denies any redness or drainage around the nails.  No open lesions and no recent injury or changes otherwise since I last saw her.  PCP: Hulan Fess, MD  Objective: NAD DP/PT pulses palpable, CRT less than 3 seconds Nails hypertrophic, dystrophic, elongated, brittle, discolored 10. There is tenderness overlying the nails 1-5 bilaterally.  Mild incurvation of the hallux nails but no edema, erythema and signs of infection.  No drainage. No open lesions  No pain with calf compression, swelling, warmth, erythema. Overall unchanged  Assessment: Patient presents with symptomatic onychomycosis  Plan: -Treatment options including alternatives, risks, complications were discussed -Nails sharply debrided 10 without complication/bleeding.   -Discussed daily foot inspection. If there are any changes, to call the office immediately.  -Follow-up in 9 weeks or sooner if any problems are to arise. In the meantime, encouraged to call the office with any questions, concerns, changes symptoms.  Celesta Gentile, DPM

## 2021-12-21 DIAGNOSIS — M810 Age-related osteoporosis without current pathological fracture: Secondary | ICD-10-CM | POA: Diagnosis not present

## 2022-01-10 ENCOUNTER — Emergency Department (HOSPITAL_COMMUNITY)
Admission: EM | Admit: 2022-01-10 | Discharge: 2022-01-11 | Disposition: A | Discharge status: Home / Self Care | Payer: Medicare Other | Attending: Student | Admitting: Student

## 2022-01-10 ENCOUNTER — Other Ambulatory Visit: Payer: Self-pay

## 2022-01-10 ENCOUNTER — Encounter (HOSPITAL_COMMUNITY): Payer: Self-pay

## 2022-01-10 ENCOUNTER — Emergency Department (HOSPITAL_COMMUNITY): Payer: Medicare Other

## 2022-01-10 DIAGNOSIS — E119 Type 2 diabetes mellitus without complications: Secondary | ICD-10-CM | POA: Diagnosis not present

## 2022-01-10 DIAGNOSIS — I2510 Atherosclerotic heart disease of native coronary artery without angina pectoris: Secondary | ICD-10-CM | POA: Diagnosis not present

## 2022-01-10 DIAGNOSIS — Z79899 Other long term (current) drug therapy: Secondary | ICD-10-CM | POA: Insufficient documentation

## 2022-01-10 DIAGNOSIS — R410 Disorientation, unspecified: Secondary | ICD-10-CM | POA: Insufficient documentation

## 2022-01-10 DIAGNOSIS — R4182 Altered mental status, unspecified: Secondary | ICD-10-CM | POA: Diagnosis present

## 2022-01-10 DIAGNOSIS — Z7982 Long term (current) use of aspirin: Secondary | ICD-10-CM | POA: Insufficient documentation

## 2022-01-10 DIAGNOSIS — I10 Essential (primary) hypertension: Secondary | ICD-10-CM | POA: Insufficient documentation

## 2022-01-10 DIAGNOSIS — Z85828 Personal history of other malignant neoplasm of skin: Secondary | ICD-10-CM | POA: Diagnosis not present

## 2022-01-10 DIAGNOSIS — Z7984 Long term (current) use of oral hypoglycemic drugs: Secondary | ICD-10-CM | POA: Diagnosis not present

## 2022-01-10 LAB — CBC
HCT: 37.5 % (ref 36.0–46.0)
Hemoglobin: 12.3 g/dL (ref 12.0–15.0)
MCH: 29.2 pg (ref 26.0–34.0)
MCHC: 32.8 g/dL (ref 30.0–36.0)
MCV: 89.1 fL (ref 80.0–100.0)
Platelets: 190 10*3/uL (ref 150–400)
RBC: 4.21 MIL/uL (ref 3.87–5.11)
RDW: 15.9 % — ABNORMAL HIGH (ref 11.5–15.5)
WBC: 7.9 10*3/uL (ref 4.0–10.5)
nRBC: 0 % (ref 0.0–0.2)

## 2022-01-10 LAB — CBG MONITORING, ED: Glucose-Capillary: 144 mg/dL — ABNORMAL HIGH (ref 70–99)

## 2022-01-10 LAB — LACTIC ACID, PLASMA: Lactic Acid, Venous: 3.5 mmol/L (ref 0.5–1.9)

## 2022-01-10 MED ORDER — SODIUM CHLORIDE 0.9 % IV BOLUS
1000.0000 mL | Freq: Once | INTRAVENOUS | Status: AC
Start: 2022-01-10 — End: 2022-01-10
  Administered 2022-01-10: 1000 mL via INTRAVENOUS

## 2022-01-10 NOTE — ED Provider Triage Note (Signed)
Emergency Medicine Provider Triage Evaluation Note  Jennifer Gordon , a 88 y.o. female  was evaluated in triage.  Pt presents with family who complaint of altered mental status.  The patient's family states that she was found on the ground this morning by her home health aide.  The patient family states that they are unsure how long she was on the ground for.  Patient family states that ever since this time she has had more issues doing household tasks as he typically has no issue with.  The patient lives alone at home, has no diagnosis of Alzheimer's or dementia.  On examination the patient is not alert or oriented at all.  The patient is unsure of the year, the president, and many quarters make a dollar, what city were in.  The patient son states that the patient does have a history of UTIs, he is not sure if this is what is going on.  The patient denies all pain, she has an jovial mood and unsure why she is here.  Patient noted to be hypotensive in triage with a blood pressure of 86/43.  Septic order set initiated at this time due to hypotension, urinalysis also ordered with CBC and c-Met.  CT head also ordered.  Work-up initiated.  Review of Systems  Positive:  Negative:   Physical Exam  BP (!) 108/43 (BP Location: Left Arm)   Pulse 96   Temp 98.1 F (36.7 C) (Oral)   Resp 18   SpO2 94%  Gen:   Awake, no distress   Resp:  Normal effort  MSK:   Moves extremities without difficulty  Other:    Medical Decision Making  Medically screening exam initiated at 9:04 PM.  Appropriate orders placed.  AAMILAH AUGENSTEIN was informed that the remainder of the evaluation will be completed by another provider, this initial triage assessment does not replace that evaluation, and the importance of remaining in the ED until their evaluation is complete.    Azucena Cecil, PA-C 01/10/22 2105

## 2022-01-10 NOTE — ED Triage Notes (Signed)
Patients son said she was sitting on the floor when her caregiver arrived this morning. They said she is more altered than normal. Patient gets UTI's frequently. He said she is not following simple commands like turning on the TV. Patient said the year is 59. Alert and oriented x3.

## 2022-01-11 ENCOUNTER — Emergency Department (HOSPITAL_COMMUNITY): Payer: Medicare Other

## 2022-01-11 LAB — URINALYSIS, ROUTINE W REFLEX MICROSCOPIC
Bilirubin Urine: NEGATIVE
Glucose, UA: NEGATIVE mg/dL
Hgb urine dipstick: NEGATIVE
Ketones, ur: NEGATIVE mg/dL
Leukocytes,Ua: NEGATIVE
Nitrite: NEGATIVE
Protein, ur: NEGATIVE mg/dL
Specific Gravity, Urine: 1.005 (ref 1.005–1.030)
pH: 5 (ref 5.0–8.0)

## 2022-01-11 LAB — LACTIC ACID, PLASMA: Lactic Acid, Venous: 3.2 mmol/L (ref 0.5–1.9)

## 2022-01-11 LAB — BASIC METABOLIC PANEL
Anion gap: 8 (ref 5–15)
BUN: 12 mg/dL (ref 8–23)
CO2: 24 mmol/L (ref 22–32)
Calcium: 9.6 mg/dL (ref 8.9–10.3)
Chloride: 105 mmol/L (ref 98–111)
Creatinine, Ser: 0.57 mg/dL (ref 0.44–1.00)
GFR, Estimated: >60 mL/min (ref >60)
Glucose, Bld: 123 mg/dL — ABNORMAL HIGH (ref 70–99)
Potassium: 4.5 mmol/L (ref 3.5–5.1)
Sodium: 137 mmol/L (ref 135–145)

## 2022-01-11 LAB — CK: Total CK: 168 U/L (ref 38–234)

## 2022-01-11 NOTE — ED Provider Notes (Signed)
[facility] DEPT Provider Note  CSN: [id number] Arrival date & time: 01/10/22 2034  Chief Complaint(s) Altered Mental Status  HPI Jennifer Gordon is a 88 y.o. female with PMH HTN, HLD, CAD, T2DM who presents emergency department for evaluation of altered mental status.  Patient arrives with multiple family members who states that patient was found on the ground by her home health aide.  Patient lives at home with home health aide but currently arrives with altered mental status.  Family concerned about ultimately status.  Patient initially found to be hypotensive to 86/43 on arrival.  During my evaluation, patient's blood pressures appear to have improved and patient remains disoriented but is not complaining of any chest pain, shortness of breath, abdominal pain, nausea, vomiting or other systemic symptoms.   Past Medical History Past Medical History:  Diagnosis Date   Diabetes mellitus    High cholesterol    Hypertension    S/P angioplasty with stent    #3 stents placed,    Skin cancer of nose    Patient Active Problem List   Diagnosis Date Noted   Leukopenia 09/14/2021   Disorder of female genital organs 09/14/2021   Age-related osteoporosis without current pathological fracture 06/28/2021   Adjustment disorder with depressed mood 06/28/2021   Memory impairment 06/28/2021   Angina pectoris ([place]) 06/28/2021   Atrophic vaginitis 06/28/2021   Glaucoma 06/28/2021   Microalbuminuria 06/28/2021   Occlusion and stenosis of right carotid artery 06/28/2021   Tear film insufficiency 06/28/2021   Type 2 diabetes mellitus with unspecified complications ([place]) 89/38/1017   Atherosclerotic heart disease of native coronary artery without angina pectoris 08/06/2013   Edema 08/06/2013   Hypertension    Mixed hyperlipidemia    S/P angioplasty with stent    Skin cancer of nose    Osteoarthritis of both knees 01/03/2013   Home Medication(s) Prior to Admission  medications   Medication Sig Start Date End Date Taking? Authorizing Provider  acarbose (PRECOSE) 25 MG tablet Take 25 mg by mouth every evening.    Yes [provider]  acetaminophen (TYLENOL) 500 MG tablet Take 1 tablet (500 mg total) by mouth every 4 (four) hours as needed for moderate pain. 04/22/17  Yes Mesner, Corene Cornea, MD  Artificial Tear Ointment (ARTIFICIAL TEARS) ointment Place 1 drop into both eyes as needed (AS DIRECTED FOR DRY EYES).    Yes [provider]  ASPIRIN LOW DOSE 81 MG EC tablet Take 81 mg by mouth daily. 08/30/21  Yes [provider]  cromolyn (OPTICROM) 4 % ophthalmic solution 1 drop 4 (four) times daily. 05/26/21  Yes [provider]  cycloSPORINE (RESTASIS) 0.05 % ophthalmic emulsion Place 1 drop into both eyes 2 (two) times daily. Use for dry eyes.   Yes [provider]  denosumab (PROLIA) 60 MG/ML SOSY injection Inject 60 mg into the skin every 6 (six) months.   Yes [provider]  dorzolamide-timolol (COSOPT) 22.3-6.8 MG/ML ophthalmic solution Place 1 drop into both eyes 2 (two) times daily. Use for glaucoma   Yes [provider]  Eyelid Cleansers (OCUSOFT LID SCRUB EX) Apply topically 2 (two) times daily. As directed   Yes [provider]  latanoprost (XALATAN) 0.005 % ophthalmic solution Place 1 drop into both eyes at bedtime.    Yes [provider]  metFORMIN (GLUCOPHAGE) 1000 MG tablet Take 1,000 mg by mouth 2 (two) times daily with a meal.   Yes [provider]  Multiple Vitamin (  MULTIVITAMIN) capsule Take 1 capsule by mouth daily. Patient takes Centrum Silver A-Z   Yes [provider]  pioglitazone (ACTOS) 45 MG tablet Take 45 mg by mouth daily.   Yes [provider]  Polyvinyl Alcohol-Povidone PF (REFRESH) 1.4-0.6 % SOLN See admin instructions.   Yes [provider]  rosuvastatin (CRESTOR) 10 MG tablet Take 10 mg by mouth daily.   Yes [provider]                                                                                                                                    Past Surgical History Past Surgical History:  Procedure Laterality Date   APPENDECTOMY     TONSILLECTOMY     Family History Family History  Problem Relation Age of Onset   Hypertension Brother    Heart attack Mother    Stroke Neg Hx     Social History Social History   Tobacco Use   Smoking status: Never   Smokeless tobacco: Never  Vaping Use   Vaping Use: Never used  Substance Use Topics   Alcohol use: No   Drug use: No   Allergies Atorvastatin, Nsaids, Cephalexin, Codeine, Ibandronic acid, Lipitor [atorvastatin calcium], Niaspan [niacin er], Nitrofurantoin, Ticlid [ticlopidine hcl], and Zocor [simvastatin - high dose]  Review of Systems Review of Systems  Constitutional:  Positive for fatigue.  Neurological:  Positive for weakness.  Psychiatric/Behavioral:  Positive for confusion.     Physical Exam Vital Signs  I have reviewed the triage vital signs BP 109/62   Pulse 89   Temp 98.4 F (36.9 C)   Resp (!) 23   SpO2 96%   Physical Exam Vitals and nursing note reviewed.  Constitutional:      General: She is not in acute distress.    Appearance: She is well-developed.  HENT:     Head: Normocephalic and atraumatic.  Eyes:     Conjunctiva/sclera: Conjunctivae normal.  Cardiovascular:     Rate and Rhythm: Normal rate and regular rhythm.     Heart sounds: No murmur heard. Pulmonary:     Effort: Pulmonary effort is normal. No respiratory distress.     Breath sounds: Normal breath sounds.  Abdominal:     Palpations: Abdomen is soft.     Tenderness: There is no abdominal tenderness.  Musculoskeletal:        General: No swelling.     Cervical back: Neck supple.  Skin:    General: Skin is warm and dry.     Capillary Refill: Capillary refill takes less than 2 seconds.  Neurological:     Mental Status: She is  alert. She is disoriented.  Psychiatric:        Mood and Affect: Mood normal.     ED Results and Treatments Labs (all labs ordered are listed, but only abnormal results are displayed) Labs Reviewed  URINALYSIS, ROUTINE W REFLEX MICROSCOPIC -  Abnormal; Notable for the following components:      Result Value   Color, Urine STRAW (*)    All other components within normal limits  LACTIC ACID, PLASMA - Abnormal; Notable for the following components:   Lactic Acid, Venous 3.5 (*)    All other components within normal limits  LACTIC ACID, PLASMA - Abnormal; Notable for the following components:   Lactic Acid, Venous 3.2 (*)    All other components within normal limits  CBC - Abnormal; Notable for the following components:   RDW 15.9 (*)    All other components within normal limits  BASIC METABOLIC PANEL - Abnormal; Notable for the following components:   Glucose, Bld 123 (*)    All other components within normal limits  CBG MONITORING, ED - Abnormal; Notable for the following components:   Glucose-Capillary 144 (*)    All other components within normal limits  CULTURE, BLOOD (ROUTINE X 2)  CULTURE, BLOOD (ROUTINE X 2)  URINE CULTURE  CK                                                                                                                          Radiology CT Head Wo Contrast  Result Date: 01/10/2022 CLINICAL DATA:  Altered mental status EXAM: CT HEAD WITHOUT CONTRAST TECHNIQUE: Contiguous axial images were obtained from the base of the skull through the vertex without intravenous contrast. RADIATION DOSE REDUCTION: This exam was performed according to the departmental dose-optimization program which includes automated exposure control, adjustment of the mA and/or kV according to patient size and/or use of iterative reconstruction technique. COMPARISON:  04/21/2017 FINDINGS: Brain: Chronic atrophic and ischemic changes are noted. No acute hemorrhage, acute infarction or  space-occupying mass lesion is noted. Vascular: No hyperdense vessel or unexpected calcification. Skull: Normal. Negative for fracture or focal lesion. Sinuses/Orbits: No acute finding. Other: None. IMPRESSION: Atrophic and ischemic changes without acute abnormality. Electronically Signed   By: Inez Catalina M.D.   On: 01/10/2022 23:10    Pertinent labs & imaging results that were available during my care of the patient were reviewed by me and considered in my medical decision making (see MDM for details).  Medications Ordered in ED Medications  sodium chloride 0.9 % bolus 1,000 mL (0 mLs Intravenous Stopped 01/10/22 2352)  Procedures .Critical Care  Performed by: Teressa Lower, MD Authorized by: Teressa Lower, MD   Critical care provider statement:    Critical care time (minutes):  30   Critical care was necessary to treat or prevent imminent or life-threatening deterioration of the following conditions:  Dehydration   Critical care was time spent personally by me on the following activities:  Development of treatment plan with patient or surrogate, discussions with consultants, evaluation of patient's response to treatment, examination of patient, ordering and review of laboratory studies, ordering and review of radiographic studies, ordering and performing treatments and interventions, pulse oximetry, re-evaluation of patient's condition and review of old charts   (including critical care time)  Medical Decision Making / ED Course   This patient presents to the ED for concern of altered mental status, this involves an extensive number of treatment options, and is a complaint that carries with it a high risk of complications and morbidity.  The differential diagnosis includes UTI, electrolyte abnormality, dehydration, polypharmacy, ICH  MDM: Patient seen  emergency room for evaluation of altered mental status.  Physical exam largely unremarkable.  Laboratory evaluation with initial lactate of 3.5 and CBC unremarkable but additional lab work is pending at time of signout.  Please see provider signout for continuation of work-up.   Additional history obtained: -Additional history obtained from multiple family members -External records from outside source obtained and reviewed including: Chart review including previous notes, labs, imaging, consultation notes   Lab Tests: -I ordered, reviewed, and interpreted labs.   The pertinent results include:   Labs Reviewed  URINALYSIS, ROUTINE W REFLEX MICROSCOPIC - Abnormal; Notable for the following components:      Result Value   Color, Urine STRAW (*)    All other components within normal limits  LACTIC ACID, PLASMA - Abnormal; Notable for the following components:   Lactic Acid, Venous 3.5 (*)    All other components within normal limits  LACTIC ACID, PLASMA - Abnormal; Notable for the following components:   Lactic Acid, Venous 3.2 (*)    All other components within normal limits  CBC - Abnormal; Notable for the following components:   RDW 15.9 (*)    All other components within normal limits  BASIC METABOLIC PANEL - Abnormal; Notable for the following components:   Glucose, Bld 123 (*)    All other components within normal limits  CBG MONITORING, ED - Abnormal; Notable for the following components:   Glucose-Capillary 144 (*)    All other components within normal limits  CULTURE, BLOOD (ROUTINE X 2)  CULTURE, BLOOD (ROUTINE X 2)  URINE CULTURE  CK      Imaging Studies ordered: I ordered imaging studies including CTH I independently visualized and interpreted imaging. I agree with the radiologist interpretation   Medicines ordered and prescription drug management: Meds ordered this encounter  Medications   sodium chloride 0.9 % bolus 1,000 mL    -I have reviewed the patients  home medicines and have made adjustments as needed  Critical interventions Fluid resuscitation    Cardiac Monitoring: The patient was maintained on a cardiac monitor.  I personally viewed and interpreted the cardiac monitored which showed an underlying rhythm of: NSR  Social Determinants of Health:  Factors impacting patients care include: Lives at home alone with intermittent home health aide   Reevaluation: After the interventions noted above, I reevaluated the patient and found that they have :improved  Co morbidities that complicate the patient evaluation  Past  Medical History:  Diagnosis Date   Diabetes mellitus    High cholesterol    Hypertension    S/P angioplasty with stent    #3 stents placed,    Skin cancer of nose       Dispostion: I considered admission for this patient, and disposition will be pending completion of laboratory evaluation     Final Clinical Impression(s) / ED Diagnoses Final diagnoses:  None     '@PCDICTATION'$ @    Teressa Lower, MD 01/11/22 [phone]

## 2022-01-11 NOTE — Care Plan (Signed)
Pt is unable to undergo an MRI. Pt has had a stapes implant on the left side from 60 years ago. Family has no record of what type. RN and MD notified of MRI ineligibility

## 2022-01-11 NOTE — Discharge Instructions (Signed)
She was seen today for confusion.  The cause of her confusion is unknown.  There is not an obvious infectious source.  Make sure that she is staying hydrated.  You declined further work-up for stroke today but if anything changes or you wish to have her reevaluated, bring her back to the emergency department.

## 2022-01-11 NOTE — Progress Notes (Signed)
Attempted to get pt at 6:20a for MRI, pt is too confused to answer screening MRI questions for safety. Called son and left message , we are waiting for his call back to get her screened.

## 2022-01-11 NOTE — ED Provider Notes (Addendum)
Patient signed out pending urinalysis.  Urinalysis shows no evidence of UTI.  On recheck, patient is without complaint.  Family is adamant that she has been altered today from her baseline.  They report that she had difficulty with comprehension and following direction earlier today.  She was also found on the ground by her caregiver.  Patient does not have any complaints and states that she was unaware of these difficulties.  Work-up thus far has been largely unremarkable.  We will hold for MRI to evaluate for acute stroke.   Merryl Hacker, MD 01/11/22 0543   7:38 AM Received message from MRI.  Patient unable to get an MRI secondary to a stapes implant.  Unable to confirm implant model with the family.  She remains slightly confused.  I spoke to the son at bedside.  He states that in the morning she can be confused but yesterday, her confusion appeared much more significant.  I have offered admission for alternative stroke work-up including echo and carotid Dopplers.  Also neurology evaluation.  Son stated understanding but would like to defer further evaluation at this time as "I am not sure what we would do with that information anyway."  He wishes to take his mother home.  She ambulated with assistance in the hallway.  At baseline she ambulates with a walker.  I encouraged the son to follow-up closely or if he has any new or worsening concerns, he can bring her in for reevaluation.   Merryl Hacker, MD 01/11/22 [phone]

## 2022-01-15 LAB — CULTURE, BLOOD (ROUTINE X 2)
Culture: NO GROWTH
Culture: NO GROWTH
Special Requests: ADEQUATE

## 2022-01-18 LAB — SUSCEPTIBILITY, AER + ANAEROB: Source of Sample: 8680

## 2022-01-18 LAB — SUSCEPTIBILITY RESULT

## 2022-01-23 LAB — URINE CULTURE: Culture: 20000 — AB

## 2022-01-24 ENCOUNTER — Telehealth: Payer: Self-pay | Admitting: Emergency Medicine

## 2022-01-24 NOTE — Progress Notes (Signed)
ED Antimicrobial Stewardship Positive Culture Follow Up   Jennifer Gordon is an 88 y.o. female who presented to [facility] on 01/10/2022 with a chief complaint of  Chief Complaint  Patient presents with   Altered Mental Status    Recent Results (from the past 720 hour(s))  Blood culture (routine x 2)     Status: None   Collection Time: 01/10/22  8:56 PM   Specimen: BLOOD  Result Value Ref Range Status   Specimen Description   Final    BLOOD LEFT ANTECUBITAL Performed at [facility], [place] [address], [place], [place] [address]    Special Requests   Final    BOTTLES DRAWN AEROBIC AND ANAEROBIC Blood Culture results may not be optimal due to an inadequate volume of blood received in culture bottles Performed at [facility] [address] [place], [place] [address]    Culture   Final    NO GROWTH 5 DAYS Performed at [facility], [place] [address], [place], [place] [address]    Report Status 01/15/2022 FINAL  Final  Blood culture (routine x 2)     Status: None   Collection Time: 01/10/22  9:01 PM   Specimen: BLOOD  Result Value Ref Range Status   Specimen Description   Final    BLOOD BLOOD RIGHT WRIST Performed at [facility] [address], [place], [place] [address]    Special Requests   Final    BOTTLES DRAWN AEROBIC AND ANAEROBIC Blood Culture adequate volume Performed at [facility] [address], [place], [place] [address]    Culture   Final    NO GROWTH 5 DAYS Performed at [facility], [place] [address], [place], [place] [address]    Report Status 01/15/2022 FINAL  Final  Urine Culture     Status: Abnormal   Collection Time: 01/10/22 11:32 PM   Specimen: In/Out Cath Urine  Result Value Ref Range Status   Specimen Description   Final    IN/OUT CATH URINE Performed at [facility] [address], [place], [place] [address]    Special Requests   Final     NONE Performed at [facility], [place] [address], [place], [place] [address]    Culture (A)  Final    20,000 COLONIES/mL ESCHERICHIA COLI SEE SEPARATE REPORT Performed at [employer] Performed at [facility], [place] [address] [place], [place] [address]    Report Status 01/23/2022 FINAL  Final  Susceptibility, Aer + Anaerob     Status: Abnormal   Collection Time: 01/10/22 11:32 PM  Result Value Ref Range Status   Suscept, Aer + Anaerob Final report (A)  Corrected    Comment: (NOTE) Performed At: [facility] [address] [place], [place] [id number] Rush Farmer MD [id number] CORRECTED ON 08/23 AT 2135: PREVIOUSLY REPORTED AS Preliminary report    Source of Sample [id number] [facility] URNIE E COLI SENSI  Final    Comment: Performed at [facility], [place] [address], [place], [place] [address]  Susceptibility Result     Status: Abnormal   Collection Time: 01/10/22 11:32 PM  Result Value Ref Range Status   Suscept Result 1 Escherichia coli (A)  Final    Comment: (NOTE) Identification performed by account, not confirmed by this laboratory. Testing performed by broth microdilution.    Antimicrobial Suscept Comment  Corrected    Comment: (NOTE)      **  S = Susceptible; I = Intermediate; R = Resistant **                   P = Positive; N = Negative            MICS are expressed in micrograms per mL   Antibiotic                 RSLT#1    RSLT#2    RSLT#3    RSLT#4 Amoxicillin/Clavulanic Acid    S Ampicillin                     R Ceftriaxone                    S Cefuroxime                     S Ciprofloxacin                  R Ertapenem                      S Gentamicin                     S Imipenem                       S Levofloxacin                   R Meropenem                      S Nitrofurantoin                 I Tetracycline                   S Tobramycin                     S Trimethoprim/Sulfa             S Performed At: [facility]  [employer] [place], [place] [id number] Rush Farmer MD [id number]     Appeared on 8/15 at the behest of family who wanted her evaluated for AMS after she was found on the ground by her caretaker. Patient did not seem altered based on note and complained of no particular symptoms, though she was hypotensive on arrival. She was d/c'ed w/out abx.  UA yielded: nitrite: neg, Leuko: neg. This is not suspicious for UTI. Patient's CBC showed WBC WNL and the UCx grew 20,000 colonies/mL of E.coli.    Spoke to provider and it was agreed that the patient's lack of symptoms and low Ucx colony count render the decision to not prescribe abx appropriate.  Plan - Continue w/out abx   ED Provider: [facility]   Barnet Pall 01/24/2022, 9:10 AM PharmD Candidate Monday - Friday phone -  [phone] Saturday - Sunday phone - [phone]

## 2022-01-24 NOTE — Telephone Encounter (Signed)
Post ED Visit - Positive Culture Follow-up  Culture report reviewed by antimicrobial stewardship pharmacist: [place] Team '[]'$  Elenor Quinones, Pharm.D. '[]'$  Heide Guile, Pharm.D., BCPS AQ-ID '[]'$  Parks Neptune, Pharm.D., BCPS '[]'$  Alycia Rossetti, Pharm.D., BCPS '[]'$  [place], Pharm.D., BCPS, AAHIVP '[]'$  Legrand Como, Pharm.D., BCPS, AAHIVP '[]'$  Salome Arnt, PharmD, BCPS '[]'$  Johnnette Gourd, PharmD, BCPS '[]'$  Hughes Better, PharmD, BCPS '[]'$  Leeroy Cha, PharmD '[]'$  Laqueta Linden, PharmD, BCPS '[]'$  Albertina Parr, PharmD  [place] Team '[]'$  Leodis Sias, PharmD '[]'$  Lindell Spar, PharmD '[]'$  Royetta Asal, PharmD '[]'$  Graylin Shiver, Rph '[]'$  Rema Fendt) Glennon Mac, PharmD '[]'$  Arlyn Dunning, PharmD '[]'$  Netta Cedars, PharmD '[]'$  Dia Sitter, PharmD '[]'$  Leone Haven, PharmD '[]'$  Gretta Arab, PharmD '[]'$  Theodis Shove, PharmD '[]'$  Peggyann Juba, PharmD '[]'$  Reuel Boom, PharmD Jimmy Footman PharmD   Positive urine culture Insignificant growth, no need for treatment, no further patient follow-up is required at this time.  H[place]le Nordmann 01/24/2022, 10:04 AM

## 2022-02-02 ENCOUNTER — Other Ambulatory Visit: Payer: Medicare Other | Admitting: Internal Medicine

## 2022-02-02 VITALS — BP 128/70 | HR 72

## 2022-02-02 DIAGNOSIS — F02B0 Dementia in other diseases classified elsewhere, moderate, without behavioral disturbance, psychotic disturbance, mood disturbance, and anxiety: Secondary | ICD-10-CM | POA: Diagnosis not present

## 2022-02-02 DIAGNOSIS — Z515 Encounter for palliative care: Secondary | ICD-10-CM

## 2022-02-02 DIAGNOSIS — H401194 Primary open-angle glaucoma, unspecified eye, indeterminate stage: Secondary | ICD-10-CM | POA: Diagnosis not present

## 2022-02-02 DIAGNOSIS — G309 Alzheimer's disease, unspecified: Secondary | ICD-10-CM | POA: Diagnosis not present

## 2022-02-02 DIAGNOSIS — R54 Age-related physical debility: Secondary | ICD-10-CM | POA: Diagnosis not present

## 2022-02-02 DIAGNOSIS — I10 Essential (primary) hypertension: Secondary | ICD-10-CM

## 2022-02-02 NOTE — Progress Notes (Signed)
[facility] Follow-Up Visit Telephone: [phone]  Fax: [phone]   Date of encounter: 02/02/22 11:18 AM PATIENT NAME: Jennifer Gordon [address] [place] [address]   [phone] (home)  DOB: 1921/11/19 MRN: [id number]  REFERRING PROVIDER:   Hulan Fess, MD [place],  [place] [address] [phone]  RESPONSIBLE PARTY:    Contact Information     Name Relation Home Work Mobile   [place] Friend [phone]     [place], [place]   [phone]        I met face to face with patient and family in her home along with son, Legrand Como and evening caregiver. Palliative Care was asked to follow this patient by consultation request of  Little, Lennette Bihari, MD to address advance care planning and complex medical decision making. This is follow-up visit.                                     ASSESSMENT AND PLAN / RECOMMENDATIONS:   Advance Care Planning/Goals of Care: Goals include to maximize quality of life and symptom management. Patient/health care surrogate gave his/her permission to discuss.Our advance care planning conversation included a discussion about:    The value and importance of advance care planning  Experiences with loved ones who have been seriously ill or have died  Exploration of personal, cultural or spiritual beliefs that might influence medical decisions  Exploration of goals of care in the event of a sudden injury or illness  Identification  of a healthcare agent  Review and updating or creation of an  advance directive document . Decision not to resuscitate or to de-escalate disease focused treatments due to poor prognosis. CODE STATUS:  DNR; MOST previously completed and on file  Symptom Management/Plan: 1. Moderate major neurocognitive disorder due to Alzheimer's disease without behavioral disturbance ([place]) -has had some continued gradual progression; question if she had a stroke  but no notable focal deficits just worsened short-term memory loss after episode of "dehydration" -now has 6 hrs of caregivers each day (3 hrs bid) with definite improved support, has life alert and son calls and checks on her frequently, caregiver team is in constant discussion with each other and comes up with great creative solutions for her  2. Essential hypertension -bp controlled in 500X-381W systolic at home with normal pulse, monitor and continue same regimen, no dizziness reported  3. Frailty syndrome in geriatric patient -continue exercises with home care team and use of rollator walker, eating better with bid caregivers present and hydrating better--looks better physically this time vs last though confusion remains about the same to me  4. Primary open-angle glaucoma, indeterminate stage, unspecified laterality -continue eye drops which are getting used more faithfully now, no obvious changes in vision lately  5. Palliative care by specialist -continue increased home care support  -reminded Ronalee Belts and caregivers that we can be contacted to see if we might be available to assess her in the event of sudden decline in the future--could help coordinate care at least in that scenario  Follow up Palliative Care Visit: Palliative care will continue to follow for complex medical decision making, advance care planning, and clarification of goals. Return 05/04/2022 and prn.   This visit was coded based on medical decision making (MDM).  PPS: 40%  HOSPICE ELIGIBILITY/DIAGNOSIS: TBD  Chief Complaint: Follow-up palliative visit  HISTORY OF PRESENT ILLNESS:  LAJUANNA Gordon is a 88 y.o. year old female  with dementia, h/o falls, recent ED visit with dehydration from inadequate evening po intake when at home alone, htn, frailty, glaucoma seen for palliative f/u at home.  I was not aware she'd been in the ED until I prepared for the visit.  Apparently, on 8/15, her son was visiting and found her  seated in front of her recliner chair on the floor.  She was very confused and having difficulty following any commands.  He stayed with her through the evening. She could not walk.  There had been concerns already that she was not always eating the meals provided in the evening when her son directed her to via phone calls.  She was having more difficulty functioning independently at night.    At the ED, she did not have any UTI (only 20K E coli grew out), but reportedly was dehydrated (labs actually same as 4 yrs ago when I review them as far as BUN, cr, Na, normal GFR).  Lactic acid was 3.2.  There was question if she'd had a stroke, but CT was unremarkable and MRI could not be done due to implant in her ear.  She never had any focal neurologic signs, only generalized weakness and confusion.  She did have considerable improvement cognitively with IVF rehydration.  She remained weak and has gradually regained strength since with regular walking inside with her rollator and exercise with her caregivers.    Ronalee Belts stayed with her the first week and made several modifications to the home, caregiver schedule and the many different sets of instructions/checklists the patient has in place to permit her to remain in her home long-term as is the plan.  They added a 3 hr shift at night to the 3 hrs in the am.  Those folks get her dinner and get her set up to go to bed.  A new grab bar was added in the bathroom.  She's now using undies in the day and depends at hs.  They've moved her to all water from sodas plus she's drinking ensure better now and loves it.  She's on a scheduled toileting regimen and does get up on her own overnight 2x.  A bedside commode is kept in both her bedroom and living room for if she falls asleep in the living room.  The caregivers note that she is more insecure as they leave and asks repetitive questions--this is also at the time of day when she begins to sundown.  Her son discovered she likes  the fancy yogurt but not the regular.  She also was sometimes not finishing her evening meal, but now caregivers can remind her to go back and finish a little later if she can't eat it all at once.  Intake is so much better.   History obtained from review of EMR, discussion with primary team, and interview with family, facility staff/caregiver and/or Ms. Calcaterra.   I reviewed available labs, medications, imaging, studies and related documents from the EMR.  Records reviewed and summarized above.   ROS Review of Systems see hpi  Physical Exam: Vitals:   02/03/22 1101  BP: 128/70  Pulse: 72   There is no height or weight on file to calculate BMI. Wt Readings from Last 500 Encounters:  11/10/19 133 lb (60.3 kg)  07/12/17 140 lb (63.5 kg)  04/20/17 135 lb (61.2 kg)  11/07/16 146 lb (66.2 kg)  10/06/15 158 lb (71.7 kg)  09/22/15  158 lb (71.7 kg)  09/30/14 160 lb (72.6 kg)  08/12/14 174 lb (78.9 kg)  08/06/13 173 lb 9.6 oz (78.7 kg)   Physical Exam Constitutional:      General: She is not in acute distress.    Appearance: Normal appearance.  HENT:     Head: Normocephalic and atraumatic.  Eyes:     Conjunctiva/sclera: Conjunctivae normal.     Pupils: Pupils are equal, round, and reactive to light.  Cardiovascular:     Rate and Rhythm: Normal rate and regular rhythm.  Pulmonary:     Effort: Pulmonary effort is normal.     Breath sounds: Normal breath sounds. No wheezing, rhonchi or rales.  Abdominal:     General: Bowel sounds are normal.  Musculoskeletal:        General: Normal range of motion.     Right lower leg: Edema present.     Left lower leg: Edema present.     Comments: Nonpitting edema bilateral ankles though elevating feet in recliner  Skin:    Coloration: Skin is pale.  Neurological:     General: No focal deficit present.     Mental Status: She is alert.     Gait: Gait abnormal.     Comments: Oriented to person, place, not time; repeats questions often, but  pleasant and sociable, loves having company   Psychiatric:        Mood and Affect: Mood normal.     CURRENT PROBLEM LIST:  Patient Active Problem List   Diagnosis Date Noted   Leukopenia 09/14/2021   Disorder of female genital organs 09/14/2021   Age-related osteoporosis without current pathological fracture 06/28/2021   Adjustment disorder with depressed mood 06/28/2021   Memory impairment 06/28/2021   Angina pectoris ([place]) 06/28/2021   Atrophic vaginitis 06/28/2021   Glaucoma 06/28/2021   Microalbuminuria 06/28/2021   Occlusion and stenosis of right carotid artery 06/28/2021   Tear film insufficiency 06/28/2021   Type 2 diabetes mellitus with unspecified complications ([place]) 78/58/8502   Atherosclerotic heart disease of native coronary artery without angina pectoris 08/06/2013   Edema 08/06/2013   Hypertension    Mixed hyperlipidemia    S/P angioplasty with stent    Skin cancer of nose    Osteoarthritis of both knees 01/03/2013    PAST MEDICAL HISTORY:  Active Ambulatory Problems    Diagnosis Date Noted   Hypertension    Mixed hyperlipidemia    S/P angioplasty with stent    Skin cancer of nose    Atherosclerotic heart disease of native coronary artery without angina pectoris 08/06/2013   Edema 08/06/2013   Osteoarthritis of both knees 01/03/2013   Age-related osteoporosis without current pathological fracture 06/28/2021   Adjustment disorder with depressed mood 06/28/2021   Memory impairment 06/28/2021   Angina pectoris ([place]) 06/28/2021   Atrophic vaginitis 06/28/2021   Glaucoma 06/28/2021   Microalbuminuria 06/28/2021   Occlusion and stenosis of right carotid artery 06/28/2021   Tear film insufficiency 06/28/2021   Type 2 diabetes mellitus with unspecified complications ([place]) 77/41/2878   Leukopenia 09/14/2021   Disorder of female genital organs 09/14/2021   Resolved Ambulatory Problems    Diagnosis Date Noted   No Resolved Ambulatory Problems   Past Medical  History:  Diagnosis Date   Diabetes mellitus    High cholesterol     SOCIAL HX:  Social History   Tobacco Use   Smoking status: Never   Smokeless tobacco: Never  Substance Use Topics   Alcohol  use: No     ALLERGIES:  Allergies  Allergen Reactions   Atorvastatin Other (See Comments)   Nsaids Other (See Comments)   Cephalexin Rash   Codeine Rash    Angioedema (ALLERGY/intolerance) Patient stated, " I don't remember what happens. I think its a body rash."   Ibandronic Acid Rash   Lipitor [Atorvastatin Calcium] Rash    Angioedema (ALLERGY/intolerance) Patient stated, " I don't remember what happens. I think its a body rash."   Niaspan [Niacin Er] Rash    Angioedema (ALLERGY/intolerance) Patient stated, " I don't remember what happens. I think its a body rash."   Nitrofurantoin Rash   Ticlid [Ticlopidine Hcl] Rash    Angioedema (ALLERGY/intolerance) Pt. Stated, " I can't remember what happens. I think its a body rash."   Zocor [Simvastatin - High Dose] Rash    Angioedema (ALLERGY/intolerance) Patient stated, " I don't remember what happens. I think its a body rash."      PERTINENT MEDICATIONS:  Outpatient Encounter Medications as of 02/02/2022  Medication Sig   acarbose (PRECOSE) 25 MG tablet Take 25 mg by mouth every evening.    acetaminophen (TYLENOL) 500 MG tablet Take 1 tablet (500 mg total) by mouth every 4 (four) hours as needed for moderate pain.   Artificial Tear Ointment (ARTIFICIAL TEARS) ointment Place 1 drop into both eyes as needed (AS DIRECTED FOR DRY EYES).    ASPIRIN LOW DOSE 81 MG EC tablet Take 81 mg by mouth daily.   cromolyn (OPTICROM) 4 % ophthalmic solution 1 drop 4 (four) times daily.   cycloSPORINE (RESTASIS) 0.05 % ophthalmic emulsion Place 1 drop into both eyes 2 (two) times daily. Use for dry eyes.   denosumab (PROLIA) 60 MG/ML SOSY injection Inject 60 mg into the skin every 6 (six) months.   dorzolamide-timolol (COSOPT) 22.3-6.8 MG/ML  ophthalmic solution Place 1 drop into both eyes 2 (two) times daily. Use for glaucoma   Eyelid Cleansers (OCUSOFT LID SCRUB EX) Apply topically 2 (two) times daily. As directed   latanoprost (XALATAN) 0.005 % ophthalmic solution Place 1 drop into both eyes at bedtime.    metFORMIN (GLUCOPHAGE) 1000 MG tablet Take 1,000 mg by mouth 2 (two) times daily with a meal.   Multiple Vitamin (MULTIVITAMIN) capsule Take 1 capsule by mouth daily. Patient takes Centrum Silver A-Z   pioglitazone (ACTOS) 45 MG tablet Take 45 mg by mouth daily.   Polyvinyl Alcohol-Povidone PF (REFRESH) 1.4-0.6 % SOLN See admin instructions.   rosuvastatin (CRESTOR) 10 MG tablet Take 10 mg by mouth daily.   No facility-administered encounter medications on file as of 02/02/2022.    Thank you for the opportunity to participate in the care of Ms. Orner.  The palliative care team will continue to follow. Please call our office at [phone] if we can be of additional assistance.   Hollace Kinnier, DO  COVID-19 PATIENT SCREENING TOOL Asked and negative response unless otherwise noted:  Have you had symptoms of covid, tested positive or been in contact with someone with symptoms/positive test in the past 5-10 days? No

## 2022-02-03 ENCOUNTER — Encounter: Payer: Self-pay | Admitting: Internal Medicine

## 2022-02-16 DIAGNOSIS — E118 Type 2 diabetes mellitus with unspecified complications: Secondary | ICD-10-CM | POA: Diagnosis not present

## 2022-02-16 DIAGNOSIS — I10 Essential (primary) hypertension: Secondary | ICD-10-CM | POA: Diagnosis not present

## 2022-02-16 DIAGNOSIS — E782 Mixed hyperlipidemia: Secondary | ICD-10-CM | POA: Diagnosis not present

## 2022-02-16 DIAGNOSIS — M810 Age-related osteoporosis without current pathological fracture: Secondary | ICD-10-CM | POA: Diagnosis not present

## 2022-03-02 DIAGNOSIS — Z0000 Encounter for general adult medical examination without abnormal findings: Secondary | ICD-10-CM | POA: Diagnosis not present

## 2022-03-02 DIAGNOSIS — I10 Essential (primary) hypertension: Secondary | ICD-10-CM | POA: Diagnosis not present

## 2022-03-02 DIAGNOSIS — E118 Type 2 diabetes mellitus with unspecified complications: Secondary | ICD-10-CM | POA: Diagnosis not present

## 2022-03-14 ENCOUNTER — Ambulatory Visit: Payer: Medicare Other | Admitting: Podiatry

## 2022-03-14 DIAGNOSIS — M79674 Pain in right toe(s): Secondary | ICD-10-CM

## 2022-03-14 DIAGNOSIS — E119 Type 2 diabetes mellitus without complications: Secondary | ICD-10-CM | POA: Diagnosis not present

## 2022-03-14 DIAGNOSIS — M79675 Pain in left toe(s): Secondary | ICD-10-CM | POA: Diagnosis not present

## 2022-03-14 DIAGNOSIS — B351 Tinea unguium: Secondary | ICD-10-CM

## 2022-03-14 NOTE — Progress Notes (Signed)
Subjective:  88 y.o. returns the office today for painful, elongated, thickened toenails which she cannot trim herself. Denies any redness or drainage around the nails.  No open lesions and no recent injury or changes otherwise since I last saw her.  PCP: Hulan Fess, MD  Objective: NAD DP/PT pulses palpable, CRT less than 3 seconds Nails hypertrophic, dystrophic, elongated, brittle, discolored 10. There is tenderness overlying the nails 1-5 bilaterally.  Mild incurvation of the hallux nails but no edema, erythema and signs of infection.  No drainage. No open lesions  No pain with calf compression, swelling, warmth, erythema. Overall unchanged  Assessment: Patient presents with symptomatic onychomycosis  Plan: -Treatment options including alternatives, risks, complications were discussed -Nails sharply debrided 10 without complication/bleeding.   -Continue with daily foot inspection. If there are any changes, to call the office immediately.  -Follow-up in 9 weeks or sooner if any problems are to arise. In the meantime, encouraged to call the office with any questions, concerns, changes symptoms.  Celesta Gentile, DPM

## 2022-03-23 DIAGNOSIS — Z961 Presence of intraocular lens: Secondary | ICD-10-CM | POA: Diagnosis not present

## 2022-03-23 DIAGNOSIS — H16223 Keratoconjunctivitis sicca, not specified as Sjogren's, bilateral: Secondary | ICD-10-CM | POA: Diagnosis not present

## 2022-03-23 DIAGNOSIS — H1045 Other chronic allergic conjunctivitis: Secondary | ICD-10-CM | POA: Diagnosis not present

## 2022-03-23 DIAGNOSIS — H0102B Squamous blepharitis left eye, upper and lower eyelids: Secondary | ICD-10-CM | POA: Diagnosis not present

## 2022-03-23 DIAGNOSIS — H0102A Squamous blepharitis right eye, upper and lower eyelids: Secondary | ICD-10-CM | POA: Diagnosis not present

## 2022-03-23 DIAGNOSIS — H401133 Primary open-angle glaucoma, bilateral, severe stage: Secondary | ICD-10-CM | POA: Diagnosis not present

## 2022-03-23 DIAGNOSIS — H353121 Nonexudative age-related macular degeneration, left eye, early dry stage: Secondary | ICD-10-CM | POA: Diagnosis not present

## 2022-03-24 DIAGNOSIS — F32A Depression, unspecified: Secondary | ICD-10-CM | POA: Diagnosis not present

## 2022-05-02 DIAGNOSIS — F32A Depression, unspecified: Secondary | ICD-10-CM | POA: Diagnosis not present

## 2022-05-02 DIAGNOSIS — R21 Rash and other nonspecific skin eruption: Secondary | ICD-10-CM | POA: Diagnosis not present

## 2022-05-02 DIAGNOSIS — R8290 Unspecified abnormal findings in urine: Secondary | ICD-10-CM | POA: Diagnosis not present

## 2022-05-04 ENCOUNTER — Other Ambulatory Visit: Payer: Medicare Other | Admitting: Internal Medicine

## 2022-05-18 DIAGNOSIS — E118 Type 2 diabetes mellitus with unspecified complications: Secondary | ICD-10-CM | POA: Diagnosis not present

## 2022-05-18 DIAGNOSIS — H409 Unspecified glaucoma: Secondary | ICD-10-CM | POA: Diagnosis not present

## 2022-05-18 DIAGNOSIS — E782 Mixed hyperlipidemia: Secondary | ICD-10-CM | POA: Diagnosis not present

## 2022-05-18 DIAGNOSIS — I10 Essential (primary) hypertension: Secondary | ICD-10-CM | POA: Diagnosis not present

## 2022-05-18 DIAGNOSIS — I2510 Atherosclerotic heart disease of native coronary artery without angina pectoris: Secondary | ICD-10-CM | POA: Diagnosis not present

## 2022-05-18 DIAGNOSIS — M810 Age-related osteoporosis without current pathological fracture: Secondary | ICD-10-CM | POA: Diagnosis not present

## 2022-06-20 ENCOUNTER — Ambulatory Visit: Payer: Medicare Other | Admitting: Podiatry

## 2022-06-20 DIAGNOSIS — E119 Type 2 diabetes mellitus without complications: Secondary | ICD-10-CM

## 2022-06-20 DIAGNOSIS — B351 Tinea unguium: Secondary | ICD-10-CM

## 2022-06-20 DIAGNOSIS — M79674 Pain in right toe(s): Secondary | ICD-10-CM | POA: Diagnosis not present

## 2022-06-20 DIAGNOSIS — M79675 Pain in left toe(s): Secondary | ICD-10-CM | POA: Diagnosis not present

## 2022-06-20 NOTE — Progress Notes (Unsigned)
Subjective: Chief Complaint  Patient presents with   routine foot care    [facility]    89 y.o. returns the office today for painful, elongated, thickened toenails which she cannot trim herself. Denies any redness or drainage around the nails.  She has no other concerns today.    PCP: Hulan Fess, MD  Objective: NAD DP/PT pulses palpable, CRT less than 3 seconds Nails hypertrophic, dystrophic, elongated, brittle, discolored 10. There is tenderness overlying the nails 1-5 bilaterally.  Mild incurvation of the hallux nails but no edema, erythema and signs of infection.  No drainage. No open lesions bilaterally.  No other areas of discomfort. No pain with calf compression.  Assessment: Patient presents with symptomatic onychomycosis  Plan: -Treatment options including alternatives, risks, complications were discussed -Nails sharply debrided 10 without complication/bleeding.   -Continue with daily foot inspection. If there are any changes, to call the office immediately.  -Follow-up in 9 weeks or sooner if any problems are to arise. In the meantime, encouraged to call the office with any questions, concerns, changes symptoms.  Celesta Gentile, DPM

## 2022-06-27 DIAGNOSIS — M810 Age-related osteoporosis without current pathological fracture: Secondary | ICD-10-CM | POA: Diagnosis not present

## 2022-07-06 ENCOUNTER — Encounter: Payer: Self-pay | Admitting: Podiatry

## 2022-07-07 ENCOUNTER — Encounter: Payer: Self-pay | Admitting: Podiatry

## 2022-07-11 ENCOUNTER — Telehealth: Payer: Self-pay | Admitting: Podiatry

## 2022-07-11 NOTE — Telephone Encounter (Signed)
I have called 2 times and no answer and no voicemail is available. I sent letter on 2.9 for pt to call to r/s appt.. Appt has been canceled.Marland Kitchen

## 2022-07-25 DIAGNOSIS — H401133 Primary open-angle glaucoma, bilateral, severe stage: Secondary | ICD-10-CM | POA: Diagnosis not present

## 2022-09-06 DIAGNOSIS — E119 Type 2 diabetes mellitus without complications: Secondary | ICD-10-CM | POA: Diagnosis not present

## 2022-09-06 DIAGNOSIS — R413 Other amnesia: Secondary | ICD-10-CM | POA: Diagnosis not present

## 2022-09-06 DIAGNOSIS — R809 Proteinuria, unspecified: Secondary | ICD-10-CM | POA: Diagnosis not present

## 2022-09-06 DIAGNOSIS — I10 Essential (primary) hypertension: Secondary | ICD-10-CM | POA: Diagnosis not present

## 2022-09-06 DIAGNOSIS — M810 Age-related osteoporosis without current pathological fracture: Secondary | ICD-10-CM | POA: Diagnosis not present

## 2022-09-06 DIAGNOSIS — E782 Mixed hyperlipidemia: Secondary | ICD-10-CM | POA: Diagnosis not present

## 2022-09-18 ENCOUNTER — Ambulatory Visit: Payer: Medicare Other | Admitting: Podiatry

## 2022-09-18 DIAGNOSIS — E119 Type 2 diabetes mellitus without complications: Secondary | ICD-10-CM

## 2022-09-18 DIAGNOSIS — M79675 Pain in left toe(s): Secondary | ICD-10-CM | POA: Diagnosis not present

## 2022-09-18 DIAGNOSIS — B351 Tinea unguium: Secondary | ICD-10-CM | POA: Diagnosis not present

## 2022-09-18 DIAGNOSIS — M79674 Pain in right toe(s): Secondary | ICD-10-CM

## 2022-09-19 ENCOUNTER — Ambulatory Visit: Payer: Medicare Other | Admitting: Podiatry

## 2022-09-19 NOTE — Progress Notes (Signed)
Subjective: Chief Complaint  Patient presents with   Nail Problem    Thick painful toenails, 3 month follow up    89 y.o. returns the office today for painful, elongated, thickened toenails which she cannot trim herself. Denies any redness or drainage around the nails.  She has no other concerns today.    PCP: Catha Gosselin, MD  Objective: NAD DP/PT pulses palpable, CRT less than 3 seconds Nails hypertrophic, dystrophic, elongated, brittle, discolored 10. There is tenderness overlying the nails 1-5 bilaterally.  Mild incurvation of the hallux nails but no edema, erythema and signs of infection.  No drainage. No pain with calf compression.  Assessment: Patient presents with symptomatic onychomycosis  Plan: -Treatment options including alternatives, risks, complications were discussed -Nails sharply debrided 10 without complication/bleeding.   -Continue with daily foot inspection. If there are any changes, to call the office immediately.  -Follow-up in 9 weeks or sooner if any problems are to arise. In the meantime, encouraged to call the office with any questions, concerns, changes symptoms.  Ovid Curd, DPM

## 2022-12-21 DIAGNOSIS — R21 Rash and other nonspecific skin eruption: Secondary | ICD-10-CM | POA: Diagnosis not present

## 2022-12-26 DIAGNOSIS — M810 Age-related osteoporosis without current pathological fracture: Secondary | ICD-10-CM | POA: Diagnosis not present

## 2022-12-28 ENCOUNTER — Ambulatory Visit: Payer: Medicare Other | Admitting: Podiatry

## 2022-12-28 DIAGNOSIS — M79675 Pain in left toe(s): Secondary | ICD-10-CM | POA: Diagnosis not present

## 2022-12-28 DIAGNOSIS — M79674 Pain in right toe(s): Secondary | ICD-10-CM

## 2022-12-28 DIAGNOSIS — B351 Tinea unguium: Secondary | ICD-10-CM

## 2022-12-28 DIAGNOSIS — E119 Type 2 diabetes mellitus without complications: Secondary | ICD-10-CM

## 2023-01-04 NOTE — Progress Notes (Signed)
Subjective: Chief Complaint  Patient presents with   Diabetes    [facility]    89 y.o. returns the office today for painful, elongated, thickened toenails which she cannot trim herself. Denies any redness or drainage around the nails.  She has no other concerns today.   No open lesions.   PCP: Catha Gosselin, MD  Objective: NAD DP/PT pulses palpable, CRT less than 3 seconds Nails hypertrophic, dystrophic, elongated, brittle, discolored 10. There is tenderness overlying the nails 1-5 bilaterally.  Mild incurvation of the hallux nails but no edema, erythema and signs of infection.  No drainage. No pain with calf compression.  Assessment: 89 year old female with symptomatic onychomycosis  Plan: -Treatment options including alternatives, risks, complications were discussed -Nails sharply debrided 10 without complication/bleeding.   -Continue with daily foot inspection. If there are any changes, to call the office immediately.  -Follow-up in 9 weeks or sooner if any problems are to arise. In the meantime, encouraged to call the office with any questions, concerns, changes symptoms.  Ovid Curd, DPM

## 2023-01-23 DIAGNOSIS — H0102A Squamous blepharitis right eye, upper and lower eyelids: Secondary | ICD-10-CM | POA: Diagnosis not present

## 2023-01-23 DIAGNOSIS — H353131 Nonexudative age-related macular degeneration, bilateral, early dry stage: Secondary | ICD-10-CM | POA: Diagnosis not present

## 2023-01-23 DIAGNOSIS — H401133 Primary open-angle glaucoma, bilateral, severe stage: Secondary | ICD-10-CM | POA: Diagnosis not present

## 2023-01-23 DIAGNOSIS — H1045 Other chronic allergic conjunctivitis: Secondary | ICD-10-CM | POA: Diagnosis not present

## 2023-01-23 DIAGNOSIS — Z961 Presence of intraocular lens: Secondary | ICD-10-CM | POA: Diagnosis not present

## 2023-01-23 DIAGNOSIS — H16223 Keratoconjunctivitis sicca, not specified as Sjogren's, bilateral: Secondary | ICD-10-CM | POA: Diagnosis not present

## 2023-01-23 DIAGNOSIS — H0102B Squamous blepharitis left eye, upper and lower eyelids: Secondary | ICD-10-CM | POA: Diagnosis not present

## 2023-01-24 DIAGNOSIS — J069 Acute upper respiratory infection, unspecified: Secondary | ICD-10-CM | POA: Diagnosis not present

## 2023-03-23 DIAGNOSIS — I10 Essential (primary) hypertension: Secondary | ICD-10-CM | POA: Diagnosis not present

## 2023-03-23 DIAGNOSIS — E118 Type 2 diabetes mellitus with unspecified complications: Secondary | ICD-10-CM | POA: Diagnosis not present

## 2023-03-23 DIAGNOSIS — M810 Age-related osteoporosis without current pathological fracture: Secondary | ICD-10-CM | POA: Diagnosis not present

## 2023-03-23 DIAGNOSIS — E782 Mixed hyperlipidemia: Secondary | ICD-10-CM | POA: Diagnosis not present

## 2023-03-29 DIAGNOSIS — H409 Unspecified glaucoma: Secondary | ICD-10-CM | POA: Diagnosis not present

## 2023-03-29 DIAGNOSIS — I25119 Atherosclerotic heart disease of native coronary artery with unspecified angina pectoris: Secondary | ICD-10-CM | POA: Diagnosis not present

## 2023-03-29 DIAGNOSIS — Z6824 Body mass index (BMI) 24.0-24.9, adult: Secondary | ICD-10-CM | POA: Diagnosis not present

## 2023-03-29 DIAGNOSIS — Z0000 Encounter for general adult medical examination without abnormal findings: Secondary | ICD-10-CM | POA: Diagnosis not present

## 2023-03-29 DIAGNOSIS — M810 Age-related osteoporosis without current pathological fracture: Secondary | ICD-10-CM | POA: Diagnosis not present

## 2023-03-29 DIAGNOSIS — E782 Mixed hyperlipidemia: Secondary | ICD-10-CM | POA: Diagnosis not present

## 2023-03-29 DIAGNOSIS — E118 Type 2 diabetes mellitus with unspecified complications: Secondary | ICD-10-CM | POA: Diagnosis not present

## 2023-03-29 DIAGNOSIS — H6123 Impacted cerumen, bilateral: Secondary | ICD-10-CM | POA: Diagnosis not present

## 2023-03-29 DIAGNOSIS — R2689 Other abnormalities of gait and mobility: Secondary | ICD-10-CM | POA: Diagnosis not present

## 2023-04-05 ENCOUNTER — Ambulatory Visit: Payer: Medicare Other | Admitting: Podiatry

## 2023-04-17 DIAGNOSIS — H02401 Unspecified ptosis of right eyelid: Secondary | ICD-10-CM | POA: Diagnosis not present

## 2023-04-17 DIAGNOSIS — H10023 Other mucopurulent conjunctivitis, bilateral: Secondary | ICD-10-CM | POA: Diagnosis not present

## 2023-04-17 DIAGNOSIS — H6123 Impacted cerumen, bilateral: Secondary | ICD-10-CM | POA: Diagnosis not present

## 2023-04-17 DIAGNOSIS — H0102B Squamous blepharitis left eye, upper and lower eyelids: Secondary | ICD-10-CM | POA: Diagnosis not present

## 2023-04-17 DIAGNOSIS — S61412A Laceration without foreign body of left hand, initial encounter: Secondary | ICD-10-CM | POA: Diagnosis not present

## 2023-04-17 DIAGNOSIS — T148XXA Other injury of unspecified body region, initial encounter: Secondary | ICD-10-CM | POA: Diagnosis not present

## 2023-04-17 DIAGNOSIS — H0102A Squamous blepharitis right eye, upper and lower eyelids: Secondary | ICD-10-CM | POA: Diagnosis not present

## 2023-04-19 ENCOUNTER — Encounter: Payer: Self-pay | Admitting: Podiatry

## 2023-04-19 ENCOUNTER — Ambulatory Visit: Payer: Medicare Other | Admitting: Podiatry

## 2023-04-19 ENCOUNTER — Telehealth: Payer: Self-pay

## 2023-04-19 DIAGNOSIS — E119 Type 2 diabetes mellitus without complications: Secondary | ICD-10-CM | POA: Diagnosis not present

## 2023-04-19 DIAGNOSIS — B351 Tinea unguium: Secondary | ICD-10-CM

## 2023-04-19 DIAGNOSIS — L97411 Non-pressure chronic ulcer of right heel and midfoot limited to breakdown of skin: Secondary | ICD-10-CM

## 2023-04-19 NOTE — Telephone Encounter (Signed)
Hey Jennifer Gordon, I need to order some home wound cares supplies for this patient. They Need large xeroform, large border foam dressing for right posterior heel wound measuring 6 x 4 cm   Order form and demographics faxed to [facility] for wound care supplies Right heel partial thickness 6 x 4 x 0.2 cm Xeroform, large Bordered foam, large Bulky sterile gauze roll Saline Gloves Tape  Skin prep Adhesive remover

## 2023-04-19 NOTE — Progress Notes (Signed)
Chief Complaint  Patient presents with   Foot Pain    diabetic with water blister on right foot and it busted last week but now has drainage/ nail trim as well.  Ankles are swelling.    HPI: 89 y.o. female presents today accompanied by her friend who is assisting in her care and provides history with primary complaint of the right blister present to the right heel.  They state that it had been present for about 1 week, began draining clear fluid approximately 2 days ago.  She also comes in for assistance with management of the toenails which are thickened, elongated dystrophic, there is tenderness with direct dorsal palpation.  Past Medical History:  Diagnosis Date   Diabetes mellitus    High cholesterol    Hypertension    S/P angioplasty with stent    #3 stents placed,    Skin cancer of nose     Past Surgical History:  Procedure Laterality Date   APPENDECTOMY     TONSILLECTOMY      Allergies  Allergen Reactions   Atorvastatin Other (See Comments)   Nsaids Other (See Comments)   Cephalexin Rash   Codeine Rash    Angioedema (ALLERGY/intolerance) Patient stated, " I don't remember what happens. I think its a body rash."   Ibandronic Acid Rash   Lipitor [Atorvastatin Calcium] Rash    Angioedema (ALLERGY/intolerance) Patient stated, " I don't remember what happens. I think its a body rash."   Niaspan [Niacin Er (Antihyperlipidemic)] Rash    Angioedema (ALLERGY/intolerance) Patient stated, " I don't remember what happens. I think its a body rash."   Nitrofurantoin Rash   Ticlid [Ticlopidine Hcl] Rash    Angioedema (ALLERGY/intolerance) Pt. Stated, " I can't remember what happens. I think its a body rash."   Zocor [Simvastatin - High Dose] Rash    Angioedema (ALLERGY/intolerance) Patient stated, " I don't remember what happens. I think its a body rash."    ROS    Physical Exam: There were no vitals filed for this visit.  General: The patient is alert and  oriented x3 in no acute distress.  Dermatology: Overall pedal skin is atrophic.  Right posterior heel there is deroofed blister with serous fluid.  Following removal of the loosely adhered deroofed skin, area measures 6 x 4 x 0.2 cm.  Partial-thickness breakdown of skin noted.  No surrounding erythema appreciated.  Nails 1 through 5 bilaterally are thickened, elongated, dystrophic with subungual debris.  Vascular: Palpable pedal pulses bilaterally. Capillary refill within normal limits.  No appreciable edema.  No erythema or calor.  Neurological: Unable to assess due to patient's baseline mental status.  Musculoskeletal Exam: No pedal deformities noted    Assessment/Plan of Care: 1. Dermatophytosis of nail   2. Controlled type 2 diabetes mellitus without complication, without long-term current use of insulin (HCC)   3. 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  None  Discussed clinical findings with patient today.  Plan: -Deroofed skin of right heel blister was removed using pickups and forceps.  Underlying dermal tissue intact -Area dressed with Xeroform and gauze dressing -Wound care supplies will be sent to the patient to be applied daily -No surrounding cellulitis, will defer antibiotic management at this time.  Did discuss signs and symptoms of worsening infection and  that they should call the office immediately should this occur. -Discussed the need for offloading heel boots for the patient -Nails x 10 were debrided in thickness and length using aseptic nail nipper without incident.  No iatrogenic bleeding noted. -Follow-up in 2 weeks for continued wound care.   Kai Railsback L. Marchia Bond, AACFAS [facility]     [address] [place], [place] [address]                Office  [phone]  Fax [phone]

## 2023-04-23 ENCOUNTER — Ambulatory Visit: Payer: Medicare Other | Admitting: Podiatry

## 2023-04-25 ENCOUNTER — Emergency Department (HOSPITAL_COMMUNITY)
Admission: EM | Admit: 2023-04-25 | Discharge: 2023-04-25 | Disposition: A | Discharge status: Home / Self Care | Payer: Medicare Other | Attending: Emergency Medicine | Admitting: Emergency Medicine

## 2023-04-25 ENCOUNTER — Encounter (HOSPITAL_COMMUNITY): Payer: Self-pay

## 2023-04-25 ENCOUNTER — Emergency Department (HOSPITAL_COMMUNITY): Payer: Medicare Other

## 2023-04-25 ENCOUNTER — Other Ambulatory Visit: Payer: Self-pay

## 2023-04-25 DIAGNOSIS — Z7984 Long term (current) use of oral hypoglycemic drugs: Secondary | ICD-10-CM | POA: Insufficient documentation

## 2023-04-25 DIAGNOSIS — Z79899 Other long term (current) drug therapy: Secondary | ICD-10-CM | POA: Diagnosis not present

## 2023-04-25 DIAGNOSIS — M25561 Pain in right knee: Secondary | ICD-10-CM | POA: Diagnosis not present

## 2023-04-25 DIAGNOSIS — E119 Type 2 diabetes mellitus without complications: Secondary | ICD-10-CM | POA: Insufficient documentation

## 2023-04-25 DIAGNOSIS — M1711 Unilateral primary osteoarthritis, right knee: Secondary | ICD-10-CM | POA: Diagnosis not present

## 2023-04-25 DIAGNOSIS — Z7982 Long term (current) use of aspirin: Secondary | ICD-10-CM | POA: Insufficient documentation

## 2023-04-25 DIAGNOSIS — M7989 Other specified soft tissue disorders: Secondary | ICD-10-CM | POA: Diagnosis not present

## 2023-04-25 DIAGNOSIS — I10 Essential (primary) hypertension: Secondary | ICD-10-CM | POA: Insufficient documentation

## 2023-04-25 DIAGNOSIS — M79604 Pain in right leg: Secondary | ICD-10-CM | POA: Diagnosis not present

## 2023-04-25 MED ORDER — PREDNISONE 20 MG PO TABS: ORAL_TABLET | ORAL | 0 refills | Status: DC

## 2023-04-25 MED ORDER — OXYCODONE-ACETAMINOPHEN 5-325 MG PO TABS: ORAL_TABLET | ORAL | 0 refills | Status: DC

## 2023-04-25 MED ORDER — METHYLPREDNISOLONE SODIUM SUCC 125 MG IJ SOLR
125.0000 mg | Freq: Once | INTRAMUSCULAR | Status: AC
  Administered 2023-04-25: 125 mg via INTRAMUSCULAR
  Filled 2023-04-25: qty 2

## 2023-04-25 MED ORDER — OXYCODONE-ACETAMINOPHEN 5-325 MG PO TABS
1.0000 | ORAL_TABLET | Freq: Once | ORAL | Status: AC
  Administered 2023-04-25: 1 via ORAL
  Filled 2023-04-25: qty 1

## 2023-04-25 NOTE — Discharge Instructions (Signed)
Follow-up with your family doctor next week

## 2023-04-25 NOTE — ED Provider Notes (Signed)
[place] EMERGENCY DEPARTMENT AT [facility] Provider Note   CSN: [id number] Arrival date & time: 04/25/23  4098     History  Chief Complaint  Patient presents with   Leg Swelling    Jennifer Gordon is a 89 y.o. female.  Patient has a history of hypertension and diabetes.  She complains of pain and swelling to her right lower leg.  The history is provided by the patient and medical records. No language interpreter was used.  Leg Pain Lower extremity pain location: Right knee and right lower leg. Injury: no   Pain details:    Quality:  Aching   Radiates to:  Does not radiate   Severity:  Moderate   Onset quality:  Sudden   Timing:  Constant   Progression:  Worsening Chronicity:  Recurrent Dislocation: no   Foreign body present:  No foreign bodies Relieved by:  Nothing Associated symptoms: no back pain and no fatigue        Home Medications Prior to Admission medications   Medication Sig Start Date End Date Taking? Authorizing Provider  oxyCODONE-acetaminophen (PERCOCET/ROXICET) 5-325 MG tablet Take 1 pill every 6 hours for pain not helped by Tylenol or Motrin alone 04/25/23  Yes Bethann Berkshire, MD  acarbose (PRECOSE) 25 MG tablet Take 25 mg by mouth every evening.     [provider]  acetaminophen (TYLENOL) 500 MG tablet Take 1 tablet (500 mg total) by mouth every 4 (four) hours as needed for moderate pain. 04/22/17   Mesner, Barbara Cower, MD  Artificial Tear Ointment (ARTIFICIAL TEARS) ointment Place 1 drop into both eyes as needed (AS DIRECTED FOR DRY EYES).     [provider]  ASPIRIN LOW DOSE 81 MG EC tablet Take 81 mg by mouth daily. 08/30/21   [provider]  cromolyn (OPTICROM) 4 % ophthalmic solution 1 drop 4 (four) times daily. 05/26/21   [provider]  cycloSPORINE (RESTASIS) 0.05 % ophthalmic emulsion Place 1 drop into both eyes 2 (two) times daily. Use for dry eyes.    [provider]  denosumab  (PROLIA) 60 MG/ML SOSY injection Inject 60 mg into the skin every 6 (six) months.    [provider]  dorzolamide-timolol (COSOPT) 22.3-6.8 MG/ML ophthalmic solution Place 1 drop into both eyes 2 (two) times daily. Use for glaucoma    [provider]  Eyelid Cleansers (OCUSOFT LID SCRUB EX) Apply topically 2 (two) times daily. As directed    [provider]  latanoprost (XALATAN) 0.005 % ophthalmic solution Place 1 drop into both eyes at bedtime.     [provider]  metFORMIN (GLUCOPHAGE) 1000 MG tablet Take 1,000 mg by mouth 2 (two) times daily with a meal.    [provider]  Multiple Vitamin (MULTIVITAMIN) capsule Take 1 capsule by mouth daily. Patient takes Centrum Silver A-Z    [provider]  pioglitazone (ACTOS) 45 MG tablet Take 45 mg by mouth daily.    [provider]  Polyvinyl Alcohol-Povidone PF (REFRESH) 1.4-0.6 % SOLN See admin instructions.    [provider]  predniSONE (DELTASONE) 20 MG tablet 2 tabs po daily x 3 days 04/25/23   Bethann Berkshire, MD  rosuvastatin (CRESTOR) 10 MG tablet Take 10 mg by mouth daily.    [provider]      Allergies    Atorvastatin, Nsaids, Cephalexin, Codeine, Ibandronic acid, Lipitor [atorvastatin calcium], Niaspan [niacin er (antihyperlipidemic)], Nitrofurantoin, Ticlid [ticlopidine hcl], and Zocor [simvastatin - high  dose]    Review of Systems   Review of Systems  Constitutional:  Negative for appetite change and fatigue.  HENT:  Negative for congestion, ear discharge and sinus pressure.   Eyes:  Negative for discharge.  Respiratory:  Negative for cough.   Cardiovascular:  Negative for chest pain.  Gastrointestinal:  Negative for abdominal pain and diarrhea.  Genitourinary:  Negative for frequency and hematuria.  Musculoskeletal:  Negative for back pain.       Right lower leg pain  Skin:  Negative for rash.  Neurological:  Negative for seizures and headaches.   Psychiatric/Behavioral:  Negative for hallucinations.     Physical Exam Updated Vital Signs BP (!) 149/75   Pulse 80   Temp 97.9 F (36.6 C) (Oral)   Resp 19   Ht 5\' 2"  (1.575 m)   Wt 60 kg   SpO2 95%   BMI 24.19 kg/m  Physical Exam Vitals and nursing note reviewed.  Constitutional:      Appearance: She is well-developed.  HENT:     Head: Normocephalic.     Nose: Nose normal.  Eyes:     General: No scleral icterus.    Conjunctiva/sclera: Conjunctivae normal.  Neck:     Thyroid: No thyromegaly.  Cardiovascular:     Rate and Rhythm: Normal rate and regular rhythm.     Heart sounds: No murmur heard.    No friction rub. No gallop.  Pulmonary:     Breath sounds: No stridor. No wheezing or rales.  Chest:     Chest wall: No tenderness.  Abdominal:     General: There is no distension.     Tenderness: There is no abdominal tenderness. There is no rebound.  Musculoskeletal:     Cervical back: Neck supple.     Comments: Swollen tender right lower leg and right knee  Lymphadenopathy:     Cervical: No cervical adenopathy.  Skin:    Findings: No erythema or rash.  Neurological:     Mental Status: She is alert and oriented to person, place, and time.     Motor: No abnormal muscle tone.     Coordination: Coordination normal.  Psychiatric:        Behavior: Behavior normal.     ED Results / Procedures / Treatments   Labs (all labs ordered are listed, but only abnormal results are displayed) Labs Reviewed - No data to display  EKG None  Radiology VAS [place] LOWER EXTREMITY VENOUS (DVT) (ONLY [facility] & [facility])  Result Date: 04/25/2023  Lower Venous DVT Study Patient Name:  Jennifer Gordon  Date of Exam:   04/25/2023 Medical Rec #: [id number]        Accession #:    [id number] Date of Birth: [birth year]       Patient Gender: F Patient Age:   19 years Exam Location:  [facility] Procedure:      VAS [place] LOWER EXTREMITY VENOUS (DVT) Referring Phys: Kameren Pargas  --------------------------------------------------------------------------------  Indications: Swelling, and Edema.  Limitations: Body habitus and Unable to properly position. Comparison Study: No prior exam. Performing Technologist: Fernande Bras  Examination Guidelines: A complete evaluation includes B-mode imaging, spectral Doppler, color Doppler, and power Doppler as needed of all accessible portions of each vessel. Bilateral testing is considered an integral part of a complete examination. Limited examinations for reoccurring indications may be performed as noted. The reflux portion of the exam is performed with the patient in reverse Trendelenburg.  +---------+---------------+---------+-----------+----------+--------------+ RIGHT  CompressibilityPhasicitySpontaneityPropertiesThrombus Aging +---------+---------------+---------+-----------+----------+--------------+ CFV      Full           Yes      Yes                                 +---------+---------------+---------+-----------+----------+--------------+ SFJ      Full                                                        +---------+---------------+---------+-----------+----------+--------------+ FV Prox  Full                                                        +---------+---------------+---------+-----------+----------+--------------+ FV Mid   Full                                                        +---------+---------------+---------+-----------+----------+--------------+ FV DistalFull                                                        +---------+---------------+---------+-----------+----------+--------------+ PFV      Full                                                        +---------+---------------+---------+-----------+----------+--------------+ POP      Full           Yes      Yes                                  +---------+---------------+---------+-----------+----------+--------------+ PTV      Full                                                        +---------+---------------+---------+-----------+----------+--------------+ PERO     Full                                                        +---------+---------------+---------+-----------+----------+--------------+   +----+---------------+---------+-----------+----------+--------------+ LEFTCompressibilityPhasicitySpontaneityPropertiesThrombus Aging +----+---------------+---------+-----------+----------+--------------+ CFV Full           Yes      Yes                                 +----+---------------+---------+-----------+----------+--------------+  Summary: RIGHT: - There is no evidence of deep vein thrombosis in the lower extremity.  - No cystic structure found in the popliteal fossa.  LEFT: - No evidence of common femoral vein obstruction.   *See table(s) above for measurements and observations.    Preliminary    DG Knee Complete 4 Views Right  Result Date: 04/25/2023 CLINICAL DATA:  Right lower extremity pain and swelling. EXAM: RIGHT KNEE - COMPLETE 4+ VIEW COMPARISON:  April 20, 2017. FINDINGS: No evidence of fracture, dislocation, or joint effusion. Severe narrowing of medial joint space is noted. Calcifications are noted in the suprapatellar region suggesting possible bursitis. IMPRESSION: Severe degenerative joint disease is noted medially. Calcifications seen in suprapatellar region suggesting calcific bursitis. No fracture or dislocation is noted. Electronically Signed   By: Lupita Raider M.D.   On: 04/25/2023 11:14    Procedures Procedures    Medications Ordered in ED Medications  methylPREDNISolone sodium succinate (SOLU-MEDROL) 125 mg/2 mL injection 125 mg (has no administration in time range)  oxyCODONE-acetaminophen (PERCOCET/ROXICET) 5-325 MG per tablet 1 tablet (has no administration in time range)     ED Course/ Medical Decision Making/ A&P                                 Medical Decision Making Amount and/or Complexity of Data Reviewed Radiology: ordered.  Risk Prescription drug management.  This patient presents to the ED for concern of right lower leg pain, this involves an extensive number of treatment options, and is a complaint that carries with it a high risk of complications and morbidity.  The differential diagnosis includes osteoarthritis, DVT   Co morbidities that complicate the patient evaluation  Hypertension diabetes   Additional history obtained:  Additional history obtained from friend External records from outside source obtained and reviewed including hospital records   Lab Tests:  No labs  Imaging Studies ordered:  I ordered imaging studies including right leg x-ray and DVT study I independently visualized and interpreted imaging which showed osteoarthritis of the right knee and negative DVT I agree with the radiologist interpretation   Cardiac Monitoring: / EKG:  The patient was maintained on a cardiac monitor.  I personally viewed and interpreted the cardiac monitored which showed an underlying rhythm of: Sinus rhythm   Consultations Obtained:  No consultant Problem List / ED Course / Critical interventions / Medication management  Hypertension, DVT, right knee pain I ordered medication including Percocet for pain Reevaluation of the patient after these medicines showed that the patient improved I have reviewed the patients home medicines and have made adjustments as needed   Social Determinants of Health:  None   Test / Admission - Considered:  None  Patient with no DVT but osteoarthritis of the right knee.  She is given prednisone and Percocet and will follow-up with PCP        Final Clinical Impression(s) / ED Diagnoses Final diagnoses:  Primary osteoarthritis of right knee    Rx / DC Orders ED Discharge  Orders          Ordered    predniSONE (DELTASONE) 20 MG tablet  Status:  Discontinued        04/25/23 1314    oxyCODONE-acetaminophen (PERCOCET/ROXICET) 5-325 MG tablet        04/25/23 1314    predniSONE (DELTASONE) 20 MG tablet        04/25/23 1316  Bethann Berkshire, MD 04/30/23 1037

## 2023-04-25 NOTE — ED Triage Notes (Signed)
Patient has had right leg swelling since last night and a blister on her right heel. Her friend is worried that patient might have a blood clot. No history of blood clots. Patient has dementia. Unable to ambulate today which normally patient can walk.

## 2023-04-25 NOTE — Progress Notes (Signed)
Right lower extremity venous duplex has been completed.   Results can be found in chart review under CV Proc.  04/25/2023 1:21 PM  Fernande Bras, RVT.

## 2023-05-02 ENCOUNTER — Telehealth: Payer: Self-pay | Admitting: Podiatry

## 2023-05-02 NOTE — Telephone Encounter (Signed)
Pts friend ( emergency contact Norva Karvonen) cxled the appt for 12/5 wound care. She said it is going to be too cold for pt and that it is healing well. She will call to r/s if needed.

## 2023-05-03 ENCOUNTER — Ambulatory Visit: Payer: Medicare Other | Admitting: Podiatry

## 2023-05-10 ENCOUNTER — Emergency Department (HOSPITAL_COMMUNITY): Payer: Medicare Other

## 2023-05-10 ENCOUNTER — Encounter (HOSPITAL_COMMUNITY): Payer: Self-pay

## 2023-05-10 ENCOUNTER — Emergency Department (HOSPITAL_COMMUNITY)
Admission: EM | Admit: 2023-05-10 | Discharge: 2023-05-11 | Discharge status: Against Medical Advice | Payer: Medicare Other | Source: Home / Self Care

## 2023-05-10 DIAGNOSIS — M79604 Pain in right leg: Secondary | ICD-10-CM | POA: Insufficient documentation

## 2023-05-10 DIAGNOSIS — E7800 Pure hypercholesterolemia, unspecified: Secondary | ICD-10-CM | POA: Diagnosis not present

## 2023-05-10 DIAGNOSIS — Z515 Encounter for palliative care: Secondary | ICD-10-CM | POA: Diagnosis not present

## 2023-05-10 DIAGNOSIS — R338 Other retention of urine: Secondary | ICD-10-CM | POA: Diagnosis not present

## 2023-05-10 DIAGNOSIS — N3000 Acute cystitis without hematuria: Secondary | ICD-10-CM | POA: Diagnosis not present

## 2023-05-10 DIAGNOSIS — Z5321 Procedure and treatment not carried out due to patient leaving prior to being seen by health care provider: Secondary | ICD-10-CM | POA: Insufficient documentation

## 2023-05-10 DIAGNOSIS — N390 Urinary tract infection, site not specified: Secondary | ICD-10-CM | POA: Diagnosis not present

## 2023-05-10 DIAGNOSIS — G309 Alzheimer's disease, unspecified: Secondary | ICD-10-CM | POA: Diagnosis not present

## 2023-05-10 DIAGNOSIS — Z66 Do not resuscitate: Secondary | ICD-10-CM | POA: Diagnosis not present

## 2023-05-10 DIAGNOSIS — E871 Hypo-osmolality and hyponatremia: Secondary | ICD-10-CM | POA: Diagnosis not present

## 2023-05-10 DIAGNOSIS — G9340 Encephalopathy, unspecified: Secondary | ICD-10-CM | POA: Diagnosis not present

## 2023-05-10 DIAGNOSIS — R2243 Localized swelling, mass and lump, lower limb, bilateral: Secondary | ICD-10-CM | POA: Diagnosis not present

## 2023-05-10 DIAGNOSIS — I129 Hypertensive chronic kidney disease with stage 1 through stage 4 chronic kidney disease, or unspecified chronic kidney disease: Secondary | ICD-10-CM | POA: Diagnosis not present

## 2023-05-10 DIAGNOSIS — R39198 Other difficulties with micturition: Secondary | ICD-10-CM | POA: Insufficient documentation

## 2023-05-10 DIAGNOSIS — H9190 Unspecified hearing loss, unspecified ear: Secondary | ICD-10-CM | POA: Diagnosis not present

## 2023-05-10 DIAGNOSIS — Z1624 Resistance to multiple antibiotics: Secondary | ICD-10-CM | POA: Diagnosis not present

## 2023-05-10 DIAGNOSIS — G9341 Metabolic encephalopathy: Secondary | ICD-10-CM | POA: Diagnosis not present

## 2023-05-10 DIAGNOSIS — H02401 Unspecified ptosis of right eyelid: Secondary | ICD-10-CM | POA: Diagnosis not present

## 2023-05-10 DIAGNOSIS — E1122 Type 2 diabetes mellitus with diabetic chronic kidney disease: Secondary | ICD-10-CM | POA: Diagnosis not present

## 2023-05-10 DIAGNOSIS — R531 Weakness: Secondary | ICD-10-CM | POA: Diagnosis not present

## 2023-05-10 DIAGNOSIS — I82811 Embolism and thrombosis of superficial veins of right lower extremities: Secondary | ICD-10-CM | POA: Diagnosis not present

## 2023-05-10 DIAGNOSIS — H409 Unspecified glaucoma: Secondary | ICD-10-CM | POA: Diagnosis not present

## 2023-05-10 DIAGNOSIS — E860 Dehydration: Secondary | ICD-10-CM | POA: Diagnosis not present

## 2023-05-10 DIAGNOSIS — M7989 Other specified soft tissue disorders: Secondary | ICD-10-CM | POA: Diagnosis not present

## 2023-05-10 DIAGNOSIS — N1832 Chronic kidney disease, stage 3b: Secondary | ICD-10-CM | POA: Diagnosis not present

## 2023-05-10 DIAGNOSIS — E8809 Other disorders of plasma-protein metabolism, not elsewhere classified: Secondary | ICD-10-CM | POA: Diagnosis not present

## 2023-05-10 DIAGNOSIS — I82451 Acute embolism and thrombosis of right peroneal vein: Secondary | ICD-10-CM | POA: Diagnosis not present

## 2023-05-10 DIAGNOSIS — R627 Adult failure to thrive: Secondary | ICD-10-CM | POA: Diagnosis not present

## 2023-05-10 DIAGNOSIS — I700 Atherosclerosis of aorta: Secondary | ICD-10-CM | POA: Diagnosis not present

## 2023-05-10 DIAGNOSIS — E46 Unspecified protein-calorie malnutrition: Secondary | ICD-10-CM | POA: Diagnosis not present

## 2023-05-10 NOTE — ED Triage Notes (Signed)
Pt presents with c/o urinary issues. Family reports that she is not urinating as frequently and has been going all night with no urination. Pt also c/o right leg pain, had a blister on her heel that is healing.

## 2023-05-10 NOTE — ED Provider Triage Note (Signed)
Emergency Medicine Provider Triage Evaluation Note  Jennifer Gordon , a 89 y.o. female  was evaluated in triage.  Pt complains of urinary issues as well as right leg pain.  Patient's friend who is in the room reports that she has been having some increasing difficulty with urination over the last several days with very minimal urine output with really dark urine seen.  There is poor oral intake recently as well and patient is difficult to answer questions.  No subjective fever, chills or bodyaches.  No known contacts.  Review of Systems  Positive: As above Negative: As above  Physical Exam  BP 126/76 (BP Location: Left Arm)   Pulse 65   Temp (!) 97.3 F (36.3 C) (Oral)   Resp 18   SpO2 100%  Gen:   Awake, no distress   Resp:  Normal effort, clear to auscultation bilaterally MSK:   Moves extremities without difficulty, right lower leg is swollen with +1 edema compared to unremarkable left lower leg. Other:    Medical Decision Making  Medically screening exam initiated at 5:52 PM.  Appropriate orders placed.  TIFANEE PLATH was informed that the remainder of the evaluation will be completed by another provider, this initial triage assessment does not replace that evaluation, and the importance of remaining in the ED until their evaluation is complete.     Smitty Knudsen, PA-C 05/10/23 1753

## 2023-05-11 ENCOUNTER — Encounter (HOSPITAL_BASED_OUTPATIENT_CLINIC_OR_DEPARTMENT_OTHER): Payer: Self-pay | Admitting: Emergency Medicine

## 2023-05-11 ENCOUNTER — Emergency Department (HOSPITAL_BASED_OUTPATIENT_CLINIC_OR_DEPARTMENT_OTHER): Payer: Medicare Other

## 2023-05-11 ENCOUNTER — Other Ambulatory Visit: Payer: Self-pay

## 2023-05-11 ENCOUNTER — Inpatient Hospital Stay (HOSPITAL_BASED_OUTPATIENT_CLINIC_OR_DEPARTMENT_OTHER)
Admission: EM | Admit: 2023-05-11 | Discharge: 2023-05-14 | DRG: 071 | Disposition: A | Discharge status: Home Health | Payer: Medicare Other | Attending: Family Medicine | Admitting: Family Medicine

## 2023-05-11 DIAGNOSIS — N3000 Acute cystitis without hematuria: Secondary | ICD-10-CM | POA: Diagnosis present

## 2023-05-11 DIAGNOSIS — B9620 Unspecified Escherichia coli [E. coli] as the cause of diseases classified elsewhere: Secondary | ICD-10-CM | POA: Diagnosis present

## 2023-05-11 DIAGNOSIS — Z7982 Long term (current) use of aspirin: Secondary | ICD-10-CM

## 2023-05-11 DIAGNOSIS — H409 Unspecified glaucoma: Secondary | ICD-10-CM | POA: Diagnosis present

## 2023-05-11 DIAGNOSIS — E1122 Type 2 diabetes mellitus with diabetic chronic kidney disease: Secondary | ICD-10-CM | POA: Diagnosis present

## 2023-05-11 DIAGNOSIS — R39198 Other difficulties with micturition: Secondary | ICD-10-CM | POA: Diagnosis present

## 2023-05-11 DIAGNOSIS — R338 Other retention of urine: Secondary | ICD-10-CM | POA: Diagnosis present

## 2023-05-11 DIAGNOSIS — Z8249 Family history of ischemic heart disease and other diseases of the circulatory system: Secondary | ICD-10-CM

## 2023-05-11 DIAGNOSIS — I82811 Embolism and thrombosis of superficial veins of right lower extremities: Secondary | ICD-10-CM | POA: Diagnosis present

## 2023-05-11 DIAGNOSIS — Z9049 Acquired absence of other specified parts of digestive tract: Secondary | ICD-10-CM

## 2023-05-11 DIAGNOSIS — N390 Urinary tract infection, site not specified: Principal | ICD-10-CM | POA: Insufficient documentation

## 2023-05-11 DIAGNOSIS — E7800 Pure hypercholesterolemia, unspecified: Secondary | ICD-10-CM | POA: Diagnosis present

## 2023-05-11 DIAGNOSIS — I700 Atherosclerosis of aorta: Secondary | ICD-10-CM | POA: Diagnosis not present

## 2023-05-11 DIAGNOSIS — H9190 Unspecified hearing loss, unspecified ear: Secondary | ICD-10-CM | POA: Diagnosis present

## 2023-05-11 DIAGNOSIS — Z6824 Body mass index (BMI) 24.0-24.9, adult: Secondary | ICD-10-CM

## 2023-05-11 DIAGNOSIS — Z885 Allergy status to narcotic agent status: Secondary | ICD-10-CM

## 2023-05-11 DIAGNOSIS — R413 Other amnesia: Secondary | ICD-10-CM | POA: Diagnosis present

## 2023-05-11 DIAGNOSIS — F0390 Unspecified dementia without behavioral disturbance: Secondary | ICD-10-CM | POA: Diagnosis present

## 2023-05-11 DIAGNOSIS — M810 Age-related osteoporosis without current pathological fracture: Secondary | ICD-10-CM | POA: Diagnosis present

## 2023-05-11 DIAGNOSIS — M7989 Other specified soft tissue disorders: Secondary | ICD-10-CM | POA: Diagnosis not present

## 2023-05-11 DIAGNOSIS — Z9089 Acquired absence of other organs: Secondary | ICD-10-CM

## 2023-05-11 DIAGNOSIS — Z7901 Long term (current) use of anticoagulants: Secondary | ICD-10-CM

## 2023-05-11 DIAGNOSIS — Z5321 Procedure and treatment not carried out due to patient leaving prior to being seen by health care provider: Secondary | ICD-10-CM | POA: Diagnosis present

## 2023-05-11 DIAGNOSIS — G309 Alzheimer's disease, unspecified: Secondary | ICD-10-CM | POA: Diagnosis present

## 2023-05-11 DIAGNOSIS — E871 Hypo-osmolality and hyponatremia: Secondary | ICD-10-CM | POA: Diagnosis present

## 2023-05-11 DIAGNOSIS — M25461 Effusion, right knee: Secondary | ICD-10-CM | POA: Diagnosis present

## 2023-05-11 DIAGNOSIS — G9341 Metabolic encephalopathy: Principal | ICD-10-CM | POA: Diagnosis present

## 2023-05-11 DIAGNOSIS — I82401 Acute embolism and thrombosis of unspecified deep veins of right lower extremity: Secondary | ICD-10-CM | POA: Insufficient documentation

## 2023-05-11 DIAGNOSIS — R627 Adult failure to thrive: Secondary | ICD-10-CM | POA: Diagnosis present

## 2023-05-11 DIAGNOSIS — Z9889 Other specified postprocedural states: Secondary | ICD-10-CM

## 2023-05-11 DIAGNOSIS — Z888 Allergy status to other drugs, medicaments and biological substances status: Secondary | ICD-10-CM

## 2023-05-11 DIAGNOSIS — I129 Hypertensive chronic kidney disease with stage 1 through stage 4 chronic kidney disease, or unspecified chronic kidney disease: Secondary | ICD-10-CM | POA: Diagnosis present

## 2023-05-11 DIAGNOSIS — I10 Essential (primary) hypertension: Secondary | ICD-10-CM | POA: Diagnosis present

## 2023-05-11 DIAGNOSIS — Z955 Presence of coronary angioplasty implant and graft: Secondary | ICD-10-CM

## 2023-05-11 DIAGNOSIS — M7061 Trochanteric bursitis, right hip: Secondary | ICD-10-CM | POA: Diagnosis present

## 2023-05-11 DIAGNOSIS — E118 Type 2 diabetes mellitus with unspecified complications: Secondary | ICD-10-CM | POA: Diagnosis present

## 2023-05-11 DIAGNOSIS — E860 Dehydration: Secondary | ICD-10-CM | POA: Diagnosis present

## 2023-05-11 DIAGNOSIS — E8809 Other disorders of plasma-protein metabolism, not elsewhere classified: Secondary | ICD-10-CM | POA: Diagnosis present

## 2023-05-11 DIAGNOSIS — Z515 Encounter for palliative care: Secondary | ICD-10-CM

## 2023-05-11 DIAGNOSIS — H02401 Unspecified ptosis of right eyelid: Secondary | ICD-10-CM | POA: Diagnosis present

## 2023-05-11 DIAGNOSIS — Z79899 Other long term (current) drug therapy: Secondary | ICD-10-CM

## 2023-05-11 DIAGNOSIS — Z1624 Resistance to multiple antibiotics: Secondary | ICD-10-CM | POA: Diagnosis present

## 2023-05-11 DIAGNOSIS — I82451 Acute embolism and thrombosis of right peroneal vein: Secondary | ICD-10-CM | POA: Diagnosis present

## 2023-05-11 DIAGNOSIS — M25462 Effusion, left knee: Secondary | ICD-10-CM | POA: Diagnosis present

## 2023-05-11 DIAGNOSIS — Z87440 Personal history of urinary (tract) infections: Secondary | ICD-10-CM

## 2023-05-11 DIAGNOSIS — M79604 Pain in right leg: Secondary | ICD-10-CM | POA: Diagnosis present

## 2023-05-11 DIAGNOSIS — I2510 Atherosclerotic heart disease of native coronary artery without angina pectoris: Secondary | ICD-10-CM | POA: Diagnosis present

## 2023-05-11 DIAGNOSIS — M1990 Unspecified osteoarthritis, unspecified site: Secondary | ICD-10-CM | POA: Diagnosis present

## 2023-05-11 DIAGNOSIS — F02B0 Dementia in other diseases classified elsewhere, moderate, without behavioral disturbance, psychotic disturbance, mood disturbance, and anxiety: Secondary | ICD-10-CM | POA: Diagnosis present

## 2023-05-11 DIAGNOSIS — E46 Unspecified protein-calorie malnutrition: Secondary | ICD-10-CM | POA: Diagnosis present

## 2023-05-11 DIAGNOSIS — Z7984 Long term (current) use of oral hypoglycemic drugs: Secondary | ICD-10-CM

## 2023-05-11 DIAGNOSIS — Z881 Allergy status to other antibiotic agents status: Secondary | ICD-10-CM

## 2023-05-11 DIAGNOSIS — Z85828 Personal history of other malignant neoplasm of skin: Secondary | ICD-10-CM

## 2023-05-11 DIAGNOSIS — M7062 Trochanteric bursitis, left hip: Secondary | ICD-10-CM | POA: Diagnosis present

## 2023-05-11 DIAGNOSIS — Z66 Do not resuscitate: Secondary | ICD-10-CM | POA: Diagnosis present

## 2023-05-11 DIAGNOSIS — Z886 Allergy status to analgesic agent status: Secondary | ICD-10-CM

## 2023-05-11 DIAGNOSIS — N1832 Chronic kidney disease, stage 3b: Secondary | ICD-10-CM | POA: Diagnosis present

## 2023-05-11 DIAGNOSIS — G9340 Encephalopathy, unspecified: Secondary | ICD-10-CM | POA: Diagnosis present

## 2023-05-11 LAB — URINALYSIS, W/ REFLEX TO CULTURE (INFECTION SUSPECTED)
Bilirubin Urine: NEGATIVE
Glucose, UA: NEGATIVE mg/dL
Hgb urine dipstick: NEGATIVE
Nitrite: NEGATIVE
Specific Gravity, Urine: 1.02 (ref 1.005–1.030)
WBC, UA: >50 WBC/hpf (ref 0–5)
pH: 5.5 (ref 5.0–8.0)

## 2023-05-11 LAB — CBC WITH DIFFERENTIAL/PLATELET
Abs Immature Granulocytes: 0.02 10*3/uL (ref 0.00–0.07)
Basophils Absolute: 0.1 10*3/uL (ref 0.0–0.1)
Basophils Relative: 1 %
Eosinophils Absolute: 0.1 10*3/uL (ref 0.0–0.5)
Eosinophils Relative: 2 %
HCT: 35.8 % — ABNORMAL LOW (ref 36.0–46.0)
Hemoglobin: 12.1 g/dL (ref 12.0–15.0)
Immature Granulocytes: 0 %
Lymphocytes Relative: 20 %
Lymphs Abs: 1.5 10*3/uL (ref 0.7–4.0)
MCH: 28.8 pg (ref 26.0–34.0)
MCHC: 33.8 g/dL (ref 30.0–36.0)
MCV: 85.2 fL (ref 80.0–100.0)
Monocytes Absolute: 0.7 10*3/uL (ref 0.1–1.0)
Monocytes Relative: 9 %
Neutro Abs: 4.9 10*3/uL (ref 1.7–7.7)
Neutrophils Relative %: 68 %
Platelets: 241 10*3/uL (ref 150–400)
RBC: 4.2 MIL/uL (ref 3.87–5.11)
RDW: 17 % — ABNORMAL HIGH (ref 11.5–15.5)
WBC: 7.3 10*3/uL (ref 4.0–10.5)
nRBC: 0 % (ref 0.0–0.2)

## 2023-05-11 LAB — BASIC METABOLIC PANEL
Anion gap: 10 (ref 5–15)
BUN: 16 mg/dL (ref 8–23)
CO2: 23 mmol/L (ref 22–32)
Calcium: 9.5 mg/dL (ref 8.9–10.3)
Chloride: 95 mmol/L — ABNORMAL LOW (ref 98–111)
Creatinine, Ser: 0.43 mg/dL — ABNORMAL LOW (ref 0.44–1.00)
GFR, Estimated: >60 mL/min (ref >60)
Glucose, Bld: 160 mg/dL — ABNORMAL HIGH (ref 70–99)
Potassium: 4 mmol/L (ref 3.5–5.1)
Sodium: 128 mmol/L — ABNORMAL LOW (ref 135–145)

## 2023-05-11 MED ORDER — CIPROFLOXACIN IN D5W 400 MG/200ML IV SOLN
400.0000 mg | Freq: Once | INTRAVENOUS | Status: AC
  Administered 2023-05-12: 400 mg via INTRAVENOUS
  Filled 2023-05-11: qty 200

## 2023-05-11 NOTE — ED Notes (Signed)
Unsuccessful blood draw x1

## 2023-05-11 NOTE — ED Provider Notes (Signed)
[place] EMERGENCY DEPARTMENT AT [facility] Provider Note   CSN: [id number] Arrival date & time: 05/11/23  1943     History  Chief Complaint  Patient presents with   Leg Swelling    Jennifer Gordon is a 89 y.o. female presents today for decrease in urination and bilateral leg swelling.  Patient's friend states that she has had dark and foul odor to urine for the past week.  She also states that she was seen the end of last month for right leg swelling of which a blood clot was ruled out.  Patient is caretaker denies fever, chills, cough, abdominal pain, nausea, vomiting, or diarrhea.  HPI     Home Medications Prior to Admission medications   Medication Sig Start Date End Date Taking? Authorizing Provider  acarbose (PRECOSE) 25 MG tablet Take 25 mg by mouth every evening.     [provider]  acetaminophen (TYLENOL) 500 MG tablet Take 1 tablet (500 mg total) by mouth every 4 (four) hours as needed for moderate pain. 04/22/17   Mesner, Barbara Cower, MD  Artificial Tear Ointment (ARTIFICIAL TEARS) ointment Place 1 drop into both eyes as needed (AS DIRECTED FOR DRY EYES).     [provider]  ASPIRIN LOW DOSE 81 MG EC tablet Take 81 mg by mouth daily. 08/30/21   [provider]  cromolyn (OPTICROM) 4 % ophthalmic solution 1 drop 4 (four) times daily. 05/26/21   [provider]  cycloSPORINE (RESTASIS) 0.05 % ophthalmic emulsion Place 1 drop into both eyes 2 (two) times daily. Use for dry eyes.    [provider]  denosumab (PROLIA) 60 MG/ML SOSY injection Inject 60 mg into the skin every 6 (six) months.    [provider]  dorzolamide-timolol (COSOPT) 22.3-6.8 MG/ML ophthalmic solution Place 1 drop into both eyes 2 (two) times daily. Use for glaucoma    [provider]  Eyelid Cleansers (OCUSOFT LID SCRUB EX) Apply topically 2 (two) times daily. As directed    [provider]  latanoprost (XALATAN) 0.005 %  ophthalmic solution Place 1 drop into both eyes at bedtime.     [provider]  metFORMIN (GLUCOPHAGE) 1000 MG tablet Take 1,000 mg by mouth 2 (two) times daily with a meal.    [provider]  Multiple Vitamin (MULTIVITAMIN) capsule Take 1 capsule by mouth daily. Patient takes Centrum Silver A-Z    [provider]  oxyCODONE-acetaminophen (PERCOCET/ROXICET) 5-325 MG tablet Take 1 pill every 6 hours for pain not helped by Tylenol or Motrin alone 04/25/23   Bethann Berkshire, MD  pioglitazone (ACTOS) 45 MG tablet Take 45 mg by mouth daily.    [provider]  Polyvinyl Alcohol-Povidone PF (REFRESH) 1.4-0.6 % SOLN See admin instructions.    [provider]  predniSONE (DELTASONE) 20 MG tablet 2 tabs po daily x 3 days 04/25/23   Bethann Berkshire, MD  rosuvastatin (CRESTOR) 10 MG tablet Take 10 mg by mouth daily.    [provider]      Allergies    Atorvastatin, Nsaids, Cephalexin, Codeine, Ibandronic acid, Lipitor [atorvastatin calcium], Niaspan [niacin er (antihyperlipidemic)], Nitrofurantoin, Ticlid [ticlopidine hcl], and Zocor [simvastatin - high dose]    Review of Systems   Review of Systems  Respiratory:  Positive for shortness of breath.   Genitourinary:        Urine odor    Physical Exam Updated Vital Signs BP (!) 118/59   Pulse 63   Temp 98 F (  36.7 C)   Resp 18   Ht 5\' 2"  (1.575 m)   Wt 60 kg   SpO2 98%   BMI 24.19 kg/m  Physical Exam Vitals and nursing note reviewed.  Constitutional:      General: She is not in acute distress.    Appearance: She is well-developed.  HENT:     Head: Normocephalic and atraumatic.  Eyes:     Extraocular Movements: Extraocular movements intact.     Conjunctiva/sclera: Conjunctivae normal.  Cardiovascular:     Rate and Rhythm: Normal rate and regular rhythm.     Pulses: Normal pulses.     Heart sounds: Normal heart sounds. No murmur heard. Pulmonary:     Effort: Pulmonary effort is  normal. No respiratory distress.     Breath sounds: Normal breath sounds.  Abdominal:     Palpations: Abdomen is soft.     Tenderness: There is no abdominal tenderness.  Musculoskeletal:        General: No swelling.     Cervical back: Neck supple.     Right lower leg: Edema present.     Left lower leg: Edema present.     Comments: Pitting edema to the right lower extremity without erythema or signs of infection  Skin:    General: Skin is warm and dry.     Capillary Refill: Capillary refill takes less than 2 seconds.  Neurological:     Mental Status: She is alert. Mental status is at baseline.  Psychiatric:        Mood and Affect: Mood normal.     ED Results / Procedures / Treatments   Labs (all labs ordered are listed, but only abnormal results are displayed) Labs Reviewed  CBC WITH DIFFERENTIAL/PLATELET - Abnormal; Notable for the following components:      Result Value   HCT 35.8 (*)    RDW 17.0 (*)    All other components within normal limits  BASIC METABOLIC PANEL - Abnormal; Notable for the following components:   Sodium 128 (*)    Chloride 95 (*)    Glucose, Bld 160 (*)    Creatinine, Ser 0.43 (*)    All other components within normal limits  URINALYSIS, W/ REFLEX TO CULTURE (INFECTION SUSPECTED) - Abnormal; Notable for the following components:   APPearance HAZY (*)    Ketones, ur TRACE (*)    Protein, ur TRACE (*)    Leukocytes,Ua MODERATE (*)    Bacteria, UA RARE (*)    All other components within normal limits  URINE CULTURE    EKG None  Radiology DG Chest Port 1 View Result Date: 05/11/2023 CLINICAL DATA:  Leg swelling EXAM: PORTABLE CHEST 1 VIEW COMPARISON:  05/10/2023 FINDINGS: The heart size and mediastinal contours are within normal limits. Both lungs are clear. Aortic atherosclerosis. Scoliosis of the spine. IMPRESSION: No active disease. Electronically Signed   By: Jasmine Pang M.D.   On: 05/11/2023 23:11   DG Chest Portable 1 View Result Date:  05/10/2023 CLINICAL DATA:  Weakness EXAM: PORTABLE CHEST 1 VIEW COMPARISON:  None Available. FINDINGS: Single frontal view of the chest demonstrates an unremarkable cardiac silhouette. No airspace disease, effusion, or pneumothorax. Left convex scoliosis at the thoracolumbar junction. No acute bony abnormalities. IMPRESSION: 1. No acute intrathoracic process. Electronically Signed   By: Sharlet Salina M.D.   On: 05/10/2023 18:49    Procedures Procedures    Medications Ordered in ED Medications  ciprofloxacin (CIPRO) IVPB 400 mg (400 mg Intravenous  New Bag/Given 05/12/23 0008)  sodium chloride 0.9 % bolus 500 mL (has no administration in time range)    ED Course/ Medical Decision Making/ A&P                                 Medical Decision Making Amount and/or Complexity of Data Reviewed Labs: ordered. Radiology: ordered.   This patient presents to the ED with chief complaint(s) of decrease in urine output with pertinent past medical history of none which further complicates the presenting complaint. The complaint involves an extensive differential diagnosis and also carries with it a high risk of complications and morbidity.    The differential diagnosis includes UTI, pneumonia, sepsis  Additional history obtained: Additional history obtained from significant other Records reviewed Primary Care Documents  ED Course and Reassessment:   Independent labs interpretation:  The following labs were independently interpreted:  CBC: No notable findings BMP: Mild hypochloridemia, hyponatremia 128 UA: Trace ketones, moderate leuks, trace protein, rare bacteria, greater than 50 WBCs Urine culture: Pending  Independent visualization of imaging: - I independently visualized the following imaging with scope of interpretation limited to determining acute life threatening conditions related to emergency care: Chest x-ray, which revealed no active disease.  CT venogram: Pending CT Head:  Pending  Patient signed out to Dr. Manus Gunning at shift change pending imaging and admission for AMS with UTI        Final Clinical Impression(s) / ED Diagnoses Final diagnoses:  Urinary tract infection without hematuria, site unspecified  Leg swelling    Rx / DC Orders ED Discharge Orders     None         Dolphus Jenny, PA-C 05/12/23 Jacinta Shoe    Ernie Avena, MD 05/12/23 1252

## 2023-05-11 NOTE — ED Triage Notes (Signed)
Right leg swelling.worse today Blister/ wound on right heel, pain   Seen yesterday for decreased urine production. Good urination over night but minimal today

## 2023-05-11 NOTE — ED Provider Notes (Incomplete)
[place] [facility] DE[facility]MENT AT [facility] Provider Note   CSN: [id number] Arrival date & time: 05/11/23  1943     History {Add pertinent medical, surgical, social history, OB history to HPI:1} Chief Complaint  Patient presents with  . Leg Swelling    Jennifer Gordon is a 89 y.o. female presents today for decrease in urination and bilateral leg swelling.  Patient's friend states that she has had dark and foul odor to urine for the past week.  She also states that she was seen the end of last month for right leg swelling of which a blood clot was ruled out.  Patient is caretaker denies fever, chills, cough, abdominal pain, nausea, vomiting, or diarrhea.  HPI     Home Medications Prior to Admission medications   Medication Sig Start Date End Date Taking? Authorizing Provider  acarbose (PRECOSE) 25 MG tablet Take 25 mg by mouth every evening.     [provider]  acetaminophen (TYLENOL) 500 MG tablet Take 1 tablet (500 mg total) by mouth every 4 (four) hours as needed for moderate pain. 04/22/17   Mesner, Barbara Cower, MD  Artificial Tear Ointment (ARTIFICIAL TEARS) ointment Place 1 drop into both eyes as needed (AS DIRECTED FOR DRY EYES).     [provider]  ASPIRIN LOW DOSE 81 MG EC tablet Take 81 mg by mouth daily. 08/30/21   [provider]  cromolyn (OPTICROM) 4 % ophthalmic solution 1 drop 4 (four) times daily. 05/26/21   [provider]  cycloSPORINE (RESTASIS) 0.05 % ophthalmic emulsion Place 1 drop into both eyes 2 (two) times daily. Use for dry eyes.    [provider]  denosumab (PROLIA) 60 MG/ML SOSY injection Inject 60 mg into the skin every 6 (six) months.    [provider]  dorzolamide-timolol (COSOPT) 22.3-6.8 MG/ML ophthalmic solution Place 1 drop into both eyes 2 (two) times daily. Use for glaucoma    [provider]  Eyelid Cleansers (OCUSOFT LID SCRUB EX) Apply topically 2 (two) times daily. As  directed    [provider]  latanoprost (XALATAN) 0.005 % ophthalmic solution Place 1 drop into both eyes at bedtime.     [provider]  metFORMIN (GLUCOPHAGE) 1000 MG tablet Take 1,000 mg by mouth 2 (two) times daily with a meal.    [provider]  Multiple Vitamin (MULTIVITAMIN) capsule Take 1 capsule by mouth daily. Patient takes Centrum Silver A-Z    [provider]  oxyCODONE-acetaminophen (PERCOCET/ROXICET) 5-325 MG tablet Take 1 pill every 6 hours for pain not helped by Tylenol or Motrin alone 04/25/23   Bethann Berkshire, MD  pioglitazone (ACTOS) 45 MG tablet Take 45 mg by mouth daily.    [provider]  Polyvinyl Alcohol-Povidone PF (REFRESH) 1.4-0.6 % SOLN See admin instructions.    [provider]  predniSONE (DELTASONE) 20 MG tablet 2 tabs po daily x 3 days 04/25/23   Bethann Berkshire, MD  rosuvastatin (CRESTOR) 10 MG tablet Take 10 mg by mouth daily.    [provider]      Allergies    Atorvastatin, Nsaids, Cephalexin, Codeine, Ibandronic acid, Lipitor [atorvastatin calcium], Niaspan [niacin er (antihyperlipidemic)], Nitrofurantoin, Ticlid [ticlopidine hcl], and Zocor [simvastatin - high dose]    Review of Systems   Review of Systems  Respiratory:  Positive for shortness of breath.   Genitourinary:        Urine odor    Physical Exam Updated Vital Signs BP 117/72 (BP  Location: Right Arm)   Pulse 72   Temp (!) 97.5 F (36.4 C)   Resp 15   SpO2 97%  Physical Exam Vitals and nursing note reviewed.  Constitutional:      General: She is not in acute distress.    Appearance: She is well-developed.  HENT:     Head: Normocephalic and atraumatic.  Eyes:     Extraocular Movements: Extraocular movements intact.     Conjunctiva/sclera: Conjunctivae normal.  Cardiovascular:     Rate and Rhythm: Normal rate and regular rhythm.     Pulses: Normal pulses.     Heart sounds: Normal heart sounds. No murmur  heard. Pulmonary:     Effort: Pulmonary effort is normal. No respiratory distress.     Breath sounds: Normal breath sounds.  Abdominal:     Palpations: Abdomen is soft.     Tenderness: There is no abdominal tenderness.  Musculoskeletal:        General: No swelling.     Cervical back: Neck supple.     Right lower leg: Edema present.     Left lower leg: Edema present.     Comments: Pitting edema to the right lower extremity without erythema or signs of infection  Skin:    General: Skin is warm and dry.     Capillary Refill: Capillary refill takes less than 2 seconds.  Neurological:     Mental Status: She is alert. Mental status is at baseline.  Psychiatric:        Mood and Affect: Mood normal.     ED Results / Procedures / Treatments   Labs (all labs ordered are listed, but only abnormal results are displayed) Labs Reviewed  CBC WITH DIFFERENTIAL/PLATELET  BASIC METABOLIC PANEL  URINALYSIS, W/ REFLEX TO CULTURE (INFECTION SUSPECTED)    EKG None  Radiology DG Chest Portable 1 View Result Date: 05/10/2023 CLINICAL DATA:  Weakness EXAM: PORTABLE CHEST 1 VIEW COMPARISON:  None Available. FINDINGS: Single frontal view of the chest demonstrates an unremarkable cardiac silhouette. No airspace disease, effusion, or pneumothorax. Left convex scoliosis at the thoracolumbar junction. No acute bony abnormalities. IMPRESSION: 1. No acute intrathoracic process. Electronically Signed   By: Sharlet Salina M.D.   On: 05/10/2023 18:49    Procedures Procedures  {Document cardiac monitor, telemetry assessment procedure when appropriate:1}  Medications Ordered in ED Medications - No data to display  ED Course/ Medical Decision Making/ A&P   {   Click here for ABCD2, HEART and other calculatorsREFRESH Note before signing :1}                              Medical Decision Making Amount and/or Complexity of Data Reviewed Labs: ordered. Radiology: ordered.   This patient presents to  the ED with chief complaint(s) of decrease in urine output with pertinent past medical history of none which further complicates the presenting complaint. The complaint involves an extensive differential diagnosis and also carries with it a high risk of complications and morbidity.    The differential diagnosis includes UTI, pneumonia, sepsis  Additional history obtained: Additional history obtained from significant other Records reviewed Primary Care Documents  ED Course and Reassessment:   Independent labs interpretation:  The following labs were independently interpreted:  CBC: No notable findings BMP: Mild hypochloridemia, hyponatremia 128 UA: Trace ketones, moderate leuks, trace protein, rare bacteria, greater than 50 WBCs Urine culture: Pending  Independent visualization of imaging: - I  independently visualized the following imaging with scope of interpretation limited to determining acute life threatening conditions related to emergency care: Chest x-ray, which revealed no active disease.  CT venogram: Pending  Consultation: - Consulted or discussed management/test interpretation w/ external professional: Hospitalist, Dr. Antionette Char for UTI and increasing   Consideration for admission or further workup:   {Document critical care time when appropriate:1} {Document review of labs and clinical decision tools ie heart score, Chads2Vasc2 etc:1}  {Document your independent review of radiology images, and any outside records:1} {Document your discussion with family members, caretakers, and with consultants:1} {Document social determinants of health affecting pt's care:1} {Document your decision making why or why not admission, treatments were needed:1} Final Clinical Impression(s) / ED Diagnoses Final diagnoses:  None    Rx / DC Orders ED Discharge Orders     None

## 2023-05-12 ENCOUNTER — Emergency Department (HOSPITAL_BASED_OUTPATIENT_CLINIC_OR_DEPARTMENT_OTHER): Payer: Medicare Other

## 2023-05-12 DIAGNOSIS — G9340 Encephalopathy, unspecified: Secondary | ICD-10-CM | POA: Diagnosis not present

## 2023-05-12 DIAGNOSIS — E1122 Type 2 diabetes mellitus with diabetic chronic kidney disease: Secondary | ICD-10-CM | POA: Diagnosis present

## 2023-05-12 DIAGNOSIS — H409 Unspecified glaucoma: Secondary | ICD-10-CM | POA: Diagnosis present

## 2023-05-12 DIAGNOSIS — N281 Cyst of kidney, acquired: Secondary | ICD-10-CM | POA: Diagnosis not present

## 2023-05-12 DIAGNOSIS — N3289 Other specified disorders of bladder: Secondary | ICD-10-CM | POA: Diagnosis not present

## 2023-05-12 DIAGNOSIS — E860 Dehydration: Secondary | ICD-10-CM | POA: Diagnosis present

## 2023-05-12 DIAGNOSIS — I129 Hypertensive chronic kidney disease with stage 1 through stage 4 chronic kidney disease, or unspecified chronic kidney disease: Secondary | ICD-10-CM | POA: Diagnosis present

## 2023-05-12 DIAGNOSIS — B9620 Unspecified Escherichia coli [E. coli] as the cause of diseases classified elsewhere: Secondary | ICD-10-CM | POA: Diagnosis present

## 2023-05-12 DIAGNOSIS — N3000 Acute cystitis without hematuria: Secondary | ICD-10-CM | POA: Diagnosis present

## 2023-05-12 DIAGNOSIS — I82401 Acute embolism and thrombosis of unspecified deep veins of right lower extremity: Secondary | ICD-10-CM | POA: Insufficient documentation

## 2023-05-12 DIAGNOSIS — E7800 Pure hypercholesterolemia, unspecified: Secondary | ICD-10-CM | POA: Diagnosis present

## 2023-05-12 DIAGNOSIS — E8809 Other disorders of plasma-protein metabolism, not elsewhere classified: Secondary | ICD-10-CM | POA: Diagnosis present

## 2023-05-12 DIAGNOSIS — R338 Other retention of urine: Secondary | ICD-10-CM | POA: Diagnosis present

## 2023-05-12 DIAGNOSIS — Z66 Do not resuscitate: Secondary | ICD-10-CM | POA: Diagnosis present

## 2023-05-12 DIAGNOSIS — I672 Cerebral atherosclerosis: Secondary | ICD-10-CM | POA: Diagnosis not present

## 2023-05-12 DIAGNOSIS — I82811 Embolism and thrombosis of superficial veins of right lower extremities: Secondary | ICD-10-CM | POA: Diagnosis present

## 2023-05-12 DIAGNOSIS — H02401 Unspecified ptosis of right eyelid: Secondary | ICD-10-CM | POA: Diagnosis present

## 2023-05-12 DIAGNOSIS — K5730 Diverticulosis of large intestine without perforation or abscess without bleeding: Secondary | ICD-10-CM | POA: Diagnosis not present

## 2023-05-12 DIAGNOSIS — G309 Alzheimer's disease, unspecified: Secondary | ICD-10-CM | POA: Diagnosis present

## 2023-05-12 DIAGNOSIS — E871 Hypo-osmolality and hyponatremia: Secondary | ICD-10-CM | POA: Diagnosis present

## 2023-05-12 DIAGNOSIS — F02B0 Dementia in other diseases classified elsewhere, moderate, without behavioral disturbance, psychotic disturbance, mood disturbance, and anxiety: Secondary | ICD-10-CM | POA: Diagnosis present

## 2023-05-12 DIAGNOSIS — H9190 Unspecified hearing loss, unspecified ear: Secondary | ICD-10-CM | POA: Diagnosis present

## 2023-05-12 DIAGNOSIS — I700 Atherosclerosis of aorta: Secondary | ICD-10-CM | POA: Diagnosis not present

## 2023-05-12 DIAGNOSIS — Z1624 Resistance to multiple antibiotics: Secondary | ICD-10-CM | POA: Diagnosis present

## 2023-05-12 DIAGNOSIS — N390 Urinary tract infection, site not specified: Secondary | ICD-10-CM | POA: Insufficient documentation

## 2023-05-12 DIAGNOSIS — I82451 Acute embolism and thrombosis of right peroneal vein: Secondary | ICD-10-CM | POA: Diagnosis present

## 2023-05-12 DIAGNOSIS — E46 Unspecified protein-calorie malnutrition: Secondary | ICD-10-CM | POA: Diagnosis present

## 2023-05-12 DIAGNOSIS — N1832 Chronic kidney disease, stage 3b: Secondary | ICD-10-CM | POA: Diagnosis present

## 2023-05-12 DIAGNOSIS — M7989 Other specified soft tissue disorders: Secondary | ICD-10-CM | POA: Diagnosis present

## 2023-05-12 DIAGNOSIS — Z515 Encounter for palliative care: Secondary | ICD-10-CM | POA: Diagnosis not present

## 2023-05-12 DIAGNOSIS — R627 Adult failure to thrive: Secondary | ICD-10-CM | POA: Diagnosis present

## 2023-05-12 DIAGNOSIS — G9341 Metabolic encephalopathy: Secondary | ICD-10-CM | POA: Diagnosis present

## 2023-05-12 HISTORY — DX: Acute embolism and thrombosis of unspecified deep veins of right lower extremity: I82.401

## 2023-05-12 LAB — COMPREHENSIVE METABOLIC PANEL
ALT: 19 U/L (ref 0–44)
AST: 23 U/L (ref 15–41)
Albumin: 2.5 g/dL — ABNORMAL LOW (ref 3.5–5.0)
Alkaline Phosphatase: 94 U/L (ref 38–126)
Anion gap: 13 (ref 5–15)
BUN: 11 mg/dL (ref 8–23)
CO2: 22 mmol/L (ref 22–32)
Calcium: 8.8 mg/dL — ABNORMAL LOW (ref 8.9–10.3)
Chloride: 98 mmol/L (ref 98–111)
Creatinine, Ser: 0.52 mg/dL (ref 0.44–1.00)
GFR, Estimated: >60 mL/min (ref >60)
Glucose, Bld: 140 mg/dL — ABNORMAL HIGH (ref 70–99)
Potassium: 3.7 mmol/L (ref 3.5–5.1)
Sodium: 133 mmol/L — ABNORMAL LOW (ref 135–145)
Total Bilirubin: 0.7 mg/dL (ref <1.2)
Total Protein: 5.7 g/dL — ABNORMAL LOW (ref 6.5–8.1)

## 2023-05-12 LAB — CBC WITH DIFFERENTIAL/PLATELET
Abs Immature Granulocytes: 0.02 10*3/uL (ref 0.00–0.07)
Basophils Absolute: 0.1 10*3/uL (ref 0.0–0.1)
Basophils Relative: 1 %
Eosinophils Absolute: 0.2 10*3/uL (ref 0.0–0.5)
Eosinophils Relative: 4 %
HCT: 36.4 % (ref 36.0–46.0)
Hemoglobin: 12 g/dL (ref 12.0–15.0)
Immature Granulocytes: 0 %
Lymphocytes Relative: 21 %
Lymphs Abs: 1.2 10*3/uL (ref 0.7–4.0)
MCH: 28.5 pg (ref 26.0–34.0)
MCHC: 33 g/dL (ref 30.0–36.0)
MCV: 86.5 fL (ref 80.0–100.0)
Monocytes Absolute: 0.5 10*3/uL (ref 0.1–1.0)
Monocytes Relative: 9 %
Neutro Abs: 3.7 10*3/uL (ref 1.7–7.7)
Neutrophils Relative %: 65 %
Platelets: 301 10*3/uL (ref 150–400)
RBC: 4.21 MIL/uL (ref 3.87–5.11)
RDW: 16.8 % — ABNORMAL HIGH (ref 11.5–15.5)
WBC: 5.6 10*3/uL (ref 4.0–10.5)
nRBC: 0 % (ref 0.0–0.2)

## 2023-05-12 LAB — GLUCOSE, CAPILLARY
Glucose-Capillary: 117 mg/dL — ABNORMAL HIGH (ref 70–99)
Glucose-Capillary: 122 mg/dL — ABNORMAL HIGH (ref 70–99)
Glucose-Capillary: 133 mg/dL — ABNORMAL HIGH (ref 70–99)
Glucose-Capillary: 175 mg/dL — ABNORMAL HIGH (ref 70–99)

## 2023-05-12 LAB — MAGNESIUM: Magnesium: 1.4 mg/dL — ABNORMAL LOW (ref 1.7–2.4)

## 2023-05-12 LAB — C-REACTIVE PROTEIN: CRP: 7.3 mg/dL — ABNORMAL HIGH (ref <1.0)

## 2023-05-12 LAB — LACTIC ACID, PLASMA: Lactic Acid, Venous: 1.3 mmol/L (ref 0.5–1.9)

## 2023-05-12 LAB — PROCALCITONIN: Procalcitonin: <0.1 ng/mL

## 2023-05-12 MED ORDER — ROSUVASTATIN CALCIUM 5 MG PO TABS
10.0000 mg | ORAL_TABLET | Freq: Every day | ORAL | Status: DC
  Administered 2023-05-13 – 2023-05-14 (×2): 10 mg via ORAL
  Filled 2023-05-12 (×2): qty 2

## 2023-05-12 MED ORDER — LATANOPROST 0.005 % OP SOLN
1.0000 [drp] | Freq: Every day | OPHTHALMIC | Status: DC
  Administered 2023-05-12 – 2023-05-13 (×2): 1 [drp] via OPHTHALMIC
  Filled 2023-05-12: qty 2.5

## 2023-05-12 MED ORDER — INSULIN ASPART 100 UNIT/ML IJ SOLN
0.0000 [IU] | Freq: Three times a day (TID) | INTRAMUSCULAR | Status: DC
  Administered 2023-05-12 – 2023-05-13 (×2): 1 [IU] via SUBCUTANEOUS
  Administered 2023-05-13: 2 [IU] via SUBCUTANEOUS
  Administered 2023-05-13 – 2023-05-14 (×2): 1 [IU] via SUBCUTANEOUS

## 2023-05-12 MED ORDER — IOHEXOL 350 MG/ML SOLN
150.0000 mL | Freq: Once | INTRAVENOUS | Status: AC | PRN
  Administered 2023-05-12: 150 mL via INTRAVENOUS

## 2023-05-12 MED ORDER — AMPICILLIN-SULBACTAM SODIUM 3 (2-1) G IJ SOLR
3.0000 g | Freq: Two times a day (BID) | INTRAMUSCULAR | Status: DC
  Administered 2023-05-12 – 2023-05-14 (×5): 3 g via INTRAVENOUS
  Filled 2023-05-12 (×5): qty 8

## 2023-05-12 MED ORDER — ONDANSETRON HCL 4 MG/2ML IJ SOLN: 4.0000 mg | Freq: Four times a day (QID) | INTRAMUSCULAR | Status: DC | PRN

## 2023-05-12 MED ORDER — SODIUM CHLORIDE 0.9% FLUSH
3.0000 mL | Freq: Two times a day (BID) | INTRAVENOUS | Status: DC
  Administered 2023-05-12 – 2023-05-14 (×3): 3 mL via INTRAVENOUS

## 2023-05-12 MED ORDER — ENOXAPARIN SODIUM 60 MG/0.6ML IJ SOSY
1.0000 mg/kg | PREFILLED_SYRINGE | INTRAMUSCULAR | Status: DC
  Administered 2023-05-12 – 2023-05-13 (×2): 60 mg via SUBCUTANEOUS
  Filled 2023-05-12 (×3): qty 0.6

## 2023-05-12 MED ORDER — SODIUM CHLORIDE 0.9 % IV SOLN: 1.0000 g | INTRAVENOUS | Status: DC

## 2023-05-12 MED ORDER — SODIUM CHLORIDE 0.9 % IV BOLUS
500.0000 mL | Freq: Once | INTRAVENOUS | Status: AC
  Administered 2023-05-12: 500 mL via INTRAVENOUS

## 2023-05-12 MED ORDER — SODIUM CHLORIDE 0.9 % IV SOLN: INTRAVENOUS | Status: DC

## 2023-05-12 MED ORDER — INSULIN ASPART 100 UNIT/ML IJ SOLN: 0.0000 [IU] | Freq: Every day | INTRAMUSCULAR | Status: DC

## 2023-05-12 MED ORDER — CROMOLYN SODIUM 4 % OP SOLN
1.0000 [drp] | Freq: Four times a day (QID) | OPHTHALMIC | Status: DC
  Administered 2023-05-12 – 2023-05-14 (×9): 1 [drp] via OPHTHALMIC
  Filled 2023-05-12: qty 10

## 2023-05-12 MED ORDER — MELATONIN 3 MG PO TABS: 3.0000 mg | ORAL_TABLET | Freq: Every evening | ORAL | Status: DC | PRN

## 2023-05-12 MED ORDER — KCL IN DEXTROSE-NACL 20-5-0.9 MEQ/L-%-% IV SOLN
INTRAVENOUS | Status: DC
  Filled 2023-05-12: qty 1000

## 2023-05-12 MED ORDER — CYCLOSPORINE 0.05 % OP EMUL
1.0000 [drp] | Freq: Two times a day (BID) | OPHTHALMIC | Status: DC
  Administered 2023-05-12 – 2023-05-14 (×5): 1 [drp] via OPHTHALMIC
  Filled 2023-05-12 (×7): qty 30

## 2023-05-12 MED ORDER — MAGNESIUM SULFATE 2 GM/50ML IV SOLN
2.0000 g | Freq: Once | INTRAVENOUS | Status: AC
  Administered 2023-05-12: 2 g via INTRAVENOUS
  Filled 2023-05-12: qty 50

## 2023-05-12 MED ORDER — ENOXAPARIN SODIUM 40 MG/0.4ML IJ SOSY: 40.0000 mg | PREFILLED_SYRINGE | INTRAMUSCULAR | Status: DC

## 2023-05-12 MED ORDER — ACETAMINOPHEN 325 MG PO TABS: 650.0000 mg | ORAL_TABLET | Freq: Four times a day (QID) | ORAL | Status: DC | PRN

## 2023-05-12 MED ORDER — POLYVINYL ALCOHOL 1.4 % OP SOLN: 1.0000 [drp] | OPHTHALMIC | Status: DC | PRN

## 2023-05-12 MED ORDER — ACETAMINOPHEN 650 MG RE SUPP: 650.0000 mg | Freq: Four times a day (QID) | RECTAL | Status: DC | PRN

## 2023-05-12 MED ORDER — DORZOLAMIDE HCL-TIMOLOL MAL 2-0.5 % OP SOLN
1.0000 [drp] | Freq: Two times a day (BID) | OPHTHALMIC | Status: DC
  Administered 2023-05-12 – 2023-05-14 (×5): 1 [drp] via OPHTHALMIC
  Filled 2023-05-12: qty 10

## 2023-05-12 MED ORDER — ASPIRIN 81 MG PO TBEC
81.0000 mg | DELAYED_RELEASE_TABLET | Freq: Every day | ORAL | Status: DC
  Administered 2023-05-13 – 2023-05-14 (×2): 81 mg via ORAL
  Filled 2023-05-12 (×2): qty 1

## 2023-05-12 NOTE — Progress Notes (Signed)
Pharmacy Antibiotic Note  Jennifer Gordon is a 89 y.o. female admitted on 05/11/2023 with AMS in setting of infectious process. Pharmacy has been consulted for Unasyn dosing for UTI and possible abscesses in greater trochanteric region.  CIpro 400 mg IV x 1 given ~12am on 12/14.  Hx low colony count E coli UTI in 12/2022, resistant to quinolones.   Rare bacteria on UA.  Urine culture pending.  Plan: Unasyn 3 gm IV q12h. Follow renal function, culture data, clinical progress and antibiotic plans. Hx rash to Cephalexin. Has had Amoxicillin in the past.  Will monitor.  Height: 5\' 2"  (157.5 cm) Weight: 60 kg (132 lb 4.4 oz) IBW/kg (Calculated) : 50.1  Temp (24hrs), Avg:97.8 F (36.6 C), Min:97.5 F (36.4 C), Max:98 F (36.7 C)  Recent Labs  Lab 05/11/23 2113 05/12/23 0723  WBC 7.3 5.6  CREATININE 0.43* 0.52    Estimated Creatinine Clearance: 30.3 mL/min (by C-G formula based on SCr of 0.52 mg/dL).    Allergies  Allergen Reactions   Atorvastatin Other (See Comments)   Nsaids Other (See Comments)   Cephalexin Rash   Codeine Rash    Angioedema (ALLERGY/intolerance) Patient stated, " I don't remember what happens. I think its a body rash."   Ibandronic Acid Rash   Lipitor [Atorvastatin Calcium] Rash    Angioedema (ALLERGY/intolerance) Patient stated, " I don't remember what happens. I think its a body rash."   Niaspan [Niacin Er (Antihyperlipidemic)] Rash    Angioedema (ALLERGY/intolerance) Patient stated, " I don't remember what happens. I think its a body rash."   Nitrofurantoin Rash   Ticlid [Ticlopidine Hcl] Rash    Angioedema (ALLERGY/intolerance) Pt. Stated, " I can't remember what happens. I think its a body rash."   Zocor [Simvastatin - High Dose] Rash    Angioedema (ALLERGY/intolerance) Patient stated, " I don't remember what happens. I think its a body rash."    Antimicrobials this admission: Cipro x 1 on 12/14 at 0008 Unasyn 12/14>>   Dose adjustments this  admission: n/a  Microbiology results: 12/13 urine: sent; rare bacteria on UA 8/28 urine: 20K/ml E coli - R amp, cipro, LVQ, S Augmentin, CTX, Erta, Imi, mero, Septra  Thank you for allowing pharmacy to be a part of this patient's care.  Dennie Fetters, [place] 05/12/2023 8:43 AM

## 2023-05-12 NOTE — Progress Notes (Signed)
Plan of Care Note for accepted transfer   Patient: Jennifer Gordon MRN: [id number]   DOA: 05/11/2023  Facility requesting transfer: [facility]   Requesting Provider: Dr. Manus Gunning   Reason for transfer: AMS   Facility course: 89 yr old female with HTN, HLD, DM, and dementia presents with urinary symptoms and right leg swelling. Per ED physician, she has been confused and somnolent.   Vitals are reassuring. Serum sodium is 128, WBC is normal, and UA notable for rare bacteria, >50 wbc/hpf, and moderate leukocytes.   Urine was cultured and she was given ciprofloxacin and IVF. CT venogram is ordered but not yet performed.   Plan of care: The patient is accepted for admission to Med-surg  unit, at [facility]   Author: Briscoe Deutscher, MD 05/12/2023  Check [email or web address] for on-call coverage.  Nursing staff, Please call [facility] Admits & Consults System-Wide number on Amion as soon as patient's arrival, so appropriate admitting provider can evaluate the pt.

## 2023-05-12 NOTE — Progress Notes (Signed)
(  Carryover admission to the Day Admitter; accepted by Dr.  Antionette Char as transfer from  dwb  to a  med-surg bed at  [facility]  for acute metabolic encephalopathy in setting of acute cystitis. Please see Dr.  transfer progress note for additional details).   I have placed some additional preliminary admit orders via the adult multi-morbid admission order set. I have also ordered morning labs in the form of CMP, CBC, and magnesium level.  I will defer additional decision making regarding antibiotics to the admitting hospitalist.  Of note, I have set the patient code status to DNR based upon the presence of her active DNR form on file.     Newton Pigg, DO Hospitalist

## 2023-05-12 NOTE — H&P (Addendum)
History and Physical    Patient: Jennifer Gordon UUV:[id number] DOB: 12-31-1921 DOA: 05/11/2023 DOS: the patient was seen and examined on 05/12/2023 PCP: Catha Gosselin, MD  Patient coming from: Home Medical readiness/discharge: Anticipate patient will be ready for discharge on 05/15/2023  Chief Complaint:  Chief Complaint  Patient presents with   Leg Swelling   HPI: Jennifer Gordon is a 89 y.o. female with medical history significant of CAD with prior coronary stent, hypertension, moderate Alzheimer's dementia, and diabetes mellitus 2.  Patient was recently seen in the ED in November for right lower extremity swelling and pain.  Follow-up lower extremity duplex of the right lower extremity was negative for DVT.  He returned to the [facility] ED on 12/12 with issues related to decreased urination.  She was also evaluated in the [facility] ED on 12/13 for increased right lower extremity swelling.  She was brought in by a friend/caretaker.  There was a report of dark and odorous urine.  Patient has dementia at baseline and apparently has had worsening mentation.  She is also very hard of hearing.  The ED she was afebrile and normotensive.  She appeared to be dehydrated with a sodium of 128 and a chloride of 95.  No leukocytosis.  Urinalysis was markedly abnormal with moderate leukocytes, trace protein, greater than 50 WBCs.  Urine culture is pending.  Patient has been given a dose of Rocephin and hospitalist service was consulted for admission.  Upon my evaluation of the patient she was very confused and frail in appearance.  History unobtainable.  Review of the chart patient has a son that does not live in [place] apparently. Jennifer Gordon [phone] or cell [phone]  Review of Systems: As mentioned in the history of present illness. All other systems reviewed and are negative. Past Medical History:  Diagnosis Date   Diabetes mellitus    High cholesterol    Hypertension    S/P  angioplasty with stent    #3 stents placed,    Skin cancer of nose    Past Surgical History:  Procedure Laterality Date   APPENDECTOMY     TONSILLECTOMY     Social History:  reports that she has never smoked. She has never used smokeless tobacco. She reports that she does not drink alcohol and does not use drugs.  Allergies  Allergen Reactions   Atorvastatin Other (See Comments)   Nsaids Other (See Comments)   Cephalexin Rash   Codeine Rash    Angioedema (ALLERGY/intolerance) Patient stated, " I don't remember what happens. I think its a body rash."   Ibandronic Acid Rash   Lipitor [Atorvastatin Calcium] Rash    Angioedema (ALLERGY/intolerance) Patient stated, " I don't remember what happens. I think its a body rash."   Niaspan [Niacin Er (Antihyperlipidemic)] Rash    Angioedema (ALLERGY/intolerance) Patient stated, " I don't remember what happens. I think its a body rash."   Nitrofurantoin Rash   Ticlid [Ticlopidine Hcl] Rash    Angioedema (ALLERGY/intolerance) Pt. Stated, " I can't remember what happens. I think its a body rash."   Zocor [Simvastatin - High Dose] Rash    Angioedema (ALLERGY/intolerance) Patient stated, " I don't remember what happens. I think its a body rash."    Family History  Problem Relation Age of Onset   Hypertension Brother    Heart attack Mother    Stroke Neg Hx     Prior to Admission medications   Medication Sig Start Date End Date Taking? Authorizing  Provider  acarbose (PRECOSE) 25 MG tablet Take 25 mg by mouth every evening.     [provider]  acetaminophen (TYLENOL) 500 MG tablet Take 1 tablet (500 mg total) by mouth every 4 (four) hours as needed for moderate pain. 04/22/17   Mesner, Barbara Cower, MD  Artificial Tear Ointment (ARTIFICIAL TEARS) ointment Place 1 drop into both eyes as needed (AS DIRECTED FOR DRY EYES).     [provider]  ASPIRIN LOW DOSE 81 MG EC tablet Take 81 mg by mouth daily. 08/30/21   [provider]  cromolyn (OPTICROM) 4 % ophthalmic solution 1 drop 4 (four) times daily. 05/26/21   [provider]  cycloSPORINE (RESTASIS) 0.05 % ophthalmic emulsion Place 1 drop into both eyes 2 (two) times daily. Use for dry eyes.    [provider]  denosumab (PROLIA) 60 MG/ML SOSY injection Inject 60 mg into the skin every 6 (six) months.    [provider]  dorzolamide-timolol (COSOPT) 22.3-6.8 MG/ML ophthalmic solution Place 1 drop into both eyes 2 (two) times daily. Use for glaucoma    [provider]  Eyelid Cleansers (OCUSOFT LID SCRUB EX) Apply topically 2 (two) times daily. As directed    [provider]  latanoprost (XALATAN) 0.005 % ophthalmic solution Place 1 drop into both eyes at bedtime.     [provider]  metFORMIN (GLUCOPHAGE) 1000 MG tablet Take 1,000 mg by mouth 2 (two) times daily with a meal.    [provider]  Multiple Vitamin (MULTIVITAMIN) capsule Take 1 capsule by mouth daily. Patient takes Centrum Silver A-Z    [provider]  oxyCODONE-acetaminophen (PERCOCET/ROXICET) 5-325 MG tablet Take 1 pill every 6 hours for pain not helped by Tylenol or Motrin alone 04/25/23   Bethann Berkshire, MD  pioglitazone (ACTOS) 45 MG tablet Take 45 mg by mouth daily.    [provider]  Polyvinyl Alcohol-Povidone PF (REFRESH) 1.4-0.6 % SOLN See admin instructions.    [provider]  predniSONE (DELTASONE) 20 MG tablet 2 tabs po daily x 3 days 04/25/23   Bethann Berkshire, MD  rosuvastatin (CRESTOR) 10 MG tablet Take 10 mg by mouth daily.    [provider]    Physical Exam: Vitals:   05/12/23 0345 05/12/23 0400 05/12/23 0415 05/12/23 0531  BP: 103/64 111/66 (!) 103/45 (!) 125/52  Pulse: 67 70 64 65  Resp:   18 18  Temp:   98 F (36.7 C) (!) 97.5 F (36.4 C)  TempSrc:    Oral  SpO2: 98% 97% 99% 92%  Weight:      Height:       Constitutional: NAD, calm, comfortable-very frail in  appearance Respiratory: clear to auscultation bilaterally, no wheezing, no crackles. Normal respiratory effort. No accessory muscle use.  Room air Cardiovascular: Regular rate and rhythm, no murmurs / rubs / gallops.  Right lower extremity edema. 2+ pedal pulses.   Abdomen: Suprapubic tenderness, no masses palpated. No obvious hepatosplenomegaly. Bowel sounds positive.  Musculoskeletal: no clubbing / cyanosis. No joint deformity upper and lower extremities. Good ROM, no contractures. Normal muscle tone.  Skin: Has wounds bilateral heels and sacrum. Also there is a dressing extending to the left hip/buttock area ? Possibly associated with sacral wound. Neurologic: CN 2-12 grossly intact except for documented hearing loss. Sensation intact, DTR normal. Strength 3/5 x all 4 extremities.  Psychiatric: Awake and oriented to name only.  Pleasant   Data Reviewed:  Follow-up labs sodium 133,  potassium 3.7, chloride 98, glucose 140, BUN 11, creatinine 0.52, magnesium 1.4, albumin 2.5, total protein 5.7  WBC 5600 with normal differential, hemoglobin 12, platelets 301,000  Urine culture pending  CT venogram reveals occlusive thrombus of the right superficial femoral artery, DVT in the right peroneal vein, fluid collections adjacent to the bilateral greater trochanters worrisome for abscesses, moderate bilateral knee joint effusions, distended bladder.  Assessment and Plan: Acute metabolic encephalopathy in setting of infectious process Baseline mentation unclear but patient does have significant dementia Treat underlying causes Initiate delirium precautions Unable to take PO meds- NPO- start D5NS w/ KCL at 40/hr  Abnormal urinalysis concerning for UTI Bladder noted to be distended on imaging therefore will obtain bladder scan Prior urine culture demonstrated E. coli resistant to floroquinolones Given concerns over possible abscesses as noted on CT venogram will initiate IV Unasyn with pharmacy  dosing Intake and output noting patient has had decreased urinary output as well Follow-up on urine cultures, obtain blood cultures  **Bladder scan 400 cc- had purewick in place with total 300 cc since applied- purewick dc'd per protocol- I/O cath ordered x 1-bladder scan to be repeated in 4 hours/2 pm  Dehydration with hyponatremia Normal saline IV fluid hydration Maintain normal glycemic control  Small peroneal DVT with associated right lower extremity swelling Full dose Lovenox Unclear if she is an appropriate candidate for long-term anticoagulation given advanced dementia  Hypomagnesemia IV replacement and follow lab  Bilateral fluid collections adjacent to the greater trochanteric region worrisome for abscess IV Unasyn as above Consider CT of hip/pelvic area  Multiple wounds POA Wound / Incision (Open or Dehisced) 05/12/23 Incision - Open Sacrum (Active)  Date First Assessed/Time First Assessed: 05/12/23 0530   Wound Type: Incision - Open  Location: Sacrum  Present on Admission: Yes    No assessment data to display     No associated orders.     Wound / Incision (Open or Dehisced) 05/12/23 Other (Comment) Heel Right (Active)  Date First Assessed/Time First Assessed: 05/12/23 0530   Wound Type: (c) Other (Comment)  Location: Heel  Location Orientation: Right    No assessment data to display     No associated orders.     Wound / Incision (Open or Dehisced) 05/12/23 Other (Comment) Heel Left (Active)  Date First Assessed/Time First Assessed: 05/12/23 0530   Wound Type: Other (Comment)  Location: Heel  Location Orientation: Left    No assessment data to display     No associated orders.  Will need to re evaluate in am and obtain images  Diabetes mellitus 2 Poor oral intake recently so avoid any long-acting oral hypoglycemic medications Follow CBGs and provide SSI Obtain hemoglobin A1c  Moderate to severe dementia Palliative care has been involved in the outpatient  setting and patient is a full DNR Son is primary contact Patient has a caretaker at home  Known CAD with prior stent Continue statin and low-dose aspirin  Protein calorie malnutrition with failure to thrive Secondary to advanced dementia Notable for mild low protein and hypoalbuminemia Consider discussion with son regarding transitioning to comfort measures if pt does not significantly improve in the next 24-48 hrs  Hypertension Current blood pressure well-controlled   Advance Care Planning:   Code Status: Limited: Do not attempt resuscitation (DNR) -DNR-LIMITED -Do Not Intubate/DNI    VTE prophylaxis: Full dose Lovenox initiated for small peroneal DVT  Consults: None  Family Communication: No family at bedside.  Plan is to contact son later today  Severity of Illness: The appropriate patient status for this patient is OBSERVATION. Observation status is judged to be reasonable and necessary in order to provide the required intensity of service to ensure the patient's safety. The patient's presenting symptoms, physical exam findings, and initial radiographic and laboratory data in the context of their medical condition is felt to place them at decreased risk for further clinical deterioration. Furthermore, it is anticipated that the patient will be medically stable for discharge from the hospital within 2 midnights of admission.   Author: Junious Silk, NP 05/12/2023 7:04 AM  For on call review [email or web address].

## 2023-05-12 NOTE — ED Notes (Signed)
Report called and given to 5C21 nurse at [facility]

## 2023-05-13 DIAGNOSIS — G9340 Encephalopathy, unspecified: Secondary | ICD-10-CM | POA: Diagnosis not present

## 2023-05-13 LAB — CBC WITH DIFFERENTIAL/PLATELET
Abs Immature Granulocytes: 0.02 10*3/uL (ref 0.00–0.07)
Basophils Absolute: 0.1 10*3/uL (ref 0.0–0.1)
Basophils Relative: 2 %
Eosinophils Absolute: 0.2 10*3/uL (ref 0.0–0.5)
Eosinophils Relative: 4 %
HCT: 36.1 % (ref 36.0–46.0)
Hemoglobin: 11.9 g/dL — ABNORMAL LOW (ref 12.0–15.0)
Immature Granulocytes: 0 %
Lymphocytes Relative: 18 %
Lymphs Abs: 0.9 10*3/uL (ref 0.7–4.0)
MCH: 28.8 pg (ref 26.0–34.0)
MCHC: 33 g/dL (ref 30.0–36.0)
MCV: 87.4 fL (ref 80.0–100.0)
Monocytes Absolute: 0.5 10*3/uL (ref 0.1–1.0)
Monocytes Relative: 10 %
Neutro Abs: 3.4 10*3/uL (ref 1.7–7.7)
Neutrophils Relative %: 66 %
Platelets: 266 10*3/uL (ref 150–400)
RBC: 4.13 MIL/uL (ref 3.87–5.11)
RDW: 17.2 % — ABNORMAL HIGH (ref 11.5–15.5)
WBC: 5.1 10*3/uL (ref 4.0–10.5)
nRBC: 0 % (ref 0.0–0.2)

## 2023-05-13 LAB — GLUCOSE, CAPILLARY
Glucose-Capillary: 129 mg/dL — ABNORMAL HIGH (ref 70–99)
Glucose-Capillary: 173 mg/dL — ABNORMAL HIGH (ref 70–99)
Glucose-Capillary: 142 mg/dL — ABNORMAL HIGH (ref 70–99)
Glucose-Capillary: 147 mg/dL — ABNORMAL HIGH (ref 70–99)

## 2023-05-13 LAB — BASIC METABOLIC PANEL
Anion gap: 8 (ref 5–15)
BUN: 7 mg/dL — ABNORMAL LOW (ref 8–23)
CO2: 22 mmol/L (ref 22–32)
Calcium: 8.2 mg/dL — ABNORMAL LOW (ref 8.9–10.3)
Chloride: 102 mmol/L (ref 98–111)
Creatinine, Ser: 0.57 mg/dL (ref 0.44–1.00)
GFR, Estimated: >60 mL/min (ref >60)
Glucose, Bld: 147 mg/dL — ABNORMAL HIGH (ref 70–99)
Potassium: 3.8 mmol/L (ref 3.5–5.1)
Sodium: 132 mmol/L — ABNORMAL LOW (ref 135–145)

## 2023-05-13 LAB — MAGNESIUM: Magnesium: 1.6 mg/dL — ABNORMAL LOW (ref 1.7–2.4)

## 2023-05-13 MED ORDER — MAGNESIUM SULFATE 2 GM/50ML IV SOLN
2.0000 g | Freq: Once | INTRAVENOUS | Status: AC
  Administered 2023-05-13: 2 g via INTRAVENOUS
  Filled 2023-05-13: qty 50

## 2023-05-13 NOTE — Plan of Care (Signed)

## 2023-05-13 NOTE — Plan of Care (Signed)

## 2023-05-13 NOTE — Evaluation (Signed)
Occupational Therapy Evaluation Patient Details Name: Jennifer Gordon MRN: [id number] DOB: 08/11/1921 Today's Date: 05/13/2023   History of Present Illness Pt is a 89 y/o F presenting to ED on 12/13 with RLE swelling and worsening mentation. Admitted for acute encephalopathy. PMH includes CAD with prior stent, HTN, Alzheimers, DM2   Clinical Impression   Pt has a friend that stays with her and has caregivers 10am-1pm, 5pm-9pm to assist. Pt uses RW for short distance mobility and has assist for ADLs. Pt currently needing set up - max A for ADLs, total A +2 for bed mobility and max A +2 for transfers with RW. Stands upright for a few seconds then flexes trunk and sits. Pt presenting with impairments listed below, will follow acutely. Recommend HHOT at d/c given pt's family/friends/caregivers can provide level of assist needed at home.      If plan is discharge home, recommend the following: Two people to help with walking and/or transfers;A lot of help with bathing/dressing/bathroom;Assistance with cooking/housework;Direct supervision/assist for medications management;Direct supervision/assist for financial management;Assist for transportation;Help with stairs or ramp for entrance    Functional Status Assessment  Patient has had a recent decline in their functional status and demonstrates the ability to make significant improvements in function in a reasonable and predictable amount of time.  Equipment Recommendations  Wheelchair (measurements OT);Hospital bed;Hoyer lift    Recommendations for Other Services PT consult     Precautions / Restrictions Precautions Precautions: Fall Restrictions Weight Bearing Restrictions Per Provider Order: No      Mobility Bed Mobility Overal bed mobility: Needs Assistance Bed Mobility: Sidelying to Sit, Sit to Supine   Sidelying to sit: Total assist, +2 for physical assistance   Sit to supine: Total assist, +2 for physical assistance         Transfers Overall transfer level: Needs assistance Equipment used: Rolling walker (2 wheels) Transfers: Sit to/from Stand Sit to Stand: Max assist, +2 physical assistance                  Balance Overall balance assessment: Needs assistance Sitting-balance support: Feet supported Sitting balance-Leahy Scale: Fair     Standing balance support: During functional activity, Reliant on assistive device for balance Standing balance-Leahy Scale: Poor Standing balance comment: reliant on external support                           ADL either performed or assessed with clinical judgement   ADL Overall ADL's : Needs assistance/impaired Eating/Feeding: Set up   Grooming: Minimal assistance   Upper Body Bathing: Moderate assistance   Lower Body Bathing: Maximal assistance   Upper Body Dressing : Moderate assistance   Lower Body Dressing: Maximal assistance   Toilet Transfer: Maximal assistance;+2 for physical assistance   Toileting- Clothing Manipulation and Hygiene: Maximal assistance       Functional mobility during ADLs: Maximal assistance;+2 for physical assistance       Vision   Vision Assessment?: No apparent visual deficits     Perception Perception: Not tested       Praxis Praxis: Not tested       Pertinent Vitals/Pain Pain Assessment Pain Assessment: No/denies pain     Extremity/Trunk Assessment Upper Extremity Assessment Upper Extremity Assessment: Generalized weakness   Lower Extremity Assessment Lower Extremity Assessment: Defer to PT evaluation   Cervical / Trunk Assessment Cervical / Trunk Assessment: Kyphotic   Communication Communication Communication: Hearing impairment   Cognition Arousal: Alert  Behavior During Therapy: WFL for tasks assessed/performed Overall Cognitive Status: Within Functional Limits for tasks assessed                                 General Comments: HOH and oriented to self at  baseline     General Comments  none stated    Exercises     Shoulder Instructions      Home Living Family/patient expects to be discharged to:: Private residence Living Arrangements: Non-relatives/Friends Available Help at Discharge: Friend(s);Available PRN/intermittently;Personal care attendant (caregivers, 10am-1pm, 5pm-9pm) Type of Home: House Home Access: Level entry     Home Layout: One level     Bathroom Shower/Tub: Estate manager/land agent Accessibility: No   Home Equipment: BSC/3in1;Rollator (4 wheels);Shower seat          Prior Functioning/Environment Prior Level of Function : Needs assist             Mobility Comments: rollator short distances ADLs Comments: assist for ADLs        OT Problem List: Decreased strength;Decreased activity tolerance;Decreased range of motion;Impaired balance (sitting and/or standing);Decreased cognition;Decreased safety awareness      OT Treatment/Interventions: Self-care/ADL training;Therapeutic exercise;Energy conservation;DME and/or AE instruction;Therapeutic activities;Patient/family education;Cognitive remediation/compensation    OT Goals(Current goals can be found in the care plan section) Acute Rehab OT Goals Patient Stated Goal: none stated OT Goal Formulation: Patient unable to participate in goal setting Time For Goal Achievement: 05/27/23 Potential to Achieve Goals: Good ADL Goals Pt Will Transfer to Toilet: with min assist;stand pivot transfer;squat pivot transfer;bedside commode Additional ADL Goal #1: pt will perform bed mobility min A in prep for seated ADLs Additional ADL Goal #2: pt will follow 1 step command with min cues in prep for ADLs  OT Frequency: Min 1X/week    Co-evaluation PT/OT/SLP Co-Evaluation/Treatment: Yes Reason for Co-Treatment: Complexity of the patient's impairments (multi-system involvement);To address functional/ADL transfers;For patient/therapist safety          AM-PAC  OT "6 Clicks" Daily Activity     Outcome Measure Help from another person eating meals?: A Little Help from another person taking care of personal grooming?: A Little Help from another person toileting, which includes using toliet, bedpan, or urinal?: A Lot Help from another person bathing (including washing, rinsing, drying)?: A Lot Help from another person to put on and taking off regular upper body clothing?: A Lot Help from another person to put on and taking off regular lower body clothing?: A Lot 6 Click Score: 14   End of Session Equipment Utilized During Treatment: Gait belt;Rolling walker (2 wheels) Nurse Communication: Mobility status  Activity Tolerance: Patient tolerated treatment well Patient left: in bed;with call bell/phone within reach;with bed alarm set;with family/visitor present;with nursing/sitter in room  OT Visit Diagnosis: Unsteadiness on feet (R26.81);Other abnormalities of gait and mobility (R26.89);Muscle weakness (generalized) (M62.81)                Time: 7829-5621 OT Time Calculation (min): 32 min Charges:  OT General Charges $OT Visit: 1 Visit OT Evaluation $OT Eval Moderate Complexity: 1 Mod  Sharanda Shinault K, OTD, OTR/L SecureChat Preferred Acute Rehab [phone]   Lenor Provencher K Koonce 05/13/2023, 3:04 PM

## 2023-05-13 NOTE — Evaluation (Signed)
Physical Therapy Evaluation Patient Details Name: Jennifer Gordon MRN: [id number] DOB: [birth year] Today's Date: 05/13/2023  History of Present Illness  Pt is a 89 y/o F presenting to ED on 12/13 with RLE swelling and worsening mentation. Admitted for acute encephalopathy 2/2 UTI and acute R LE DVT. PMH includes CAD with prior stent, HTN, Alzheimers, DM2   Clinical Impression  Pt in bed upon arrival w/ son and friend present and agreeable to PT eval. Family reports prior to admission, pt would ambulate short distances with a RW. Pt has had increased weakness leading up to admission and has required more physical assistance to move from caregivers and family. In today's session, pt required TotalAx2 for bed mobility and MaxAx2 to stand w/ RW. Pt had heavy posterior lean and was unable to maintain standing balance without MaxAx2. Pt presents to therapy session with decreased strength, balance, mobility, and activity tolerance. Pt would benefit from acute skilled PT to address functional impairments. Family and friend prefer for return home with current set up and home health PT services. Recommending hospital bed, WC, and hoyer lift for return home. Acute PT to follow.          If plan is discharge home, recommend the following: A lot of help with walking and/or transfers;A lot of help with bathing/dressing/bathroom;Assistance with cooking/housework;Direct supervision/assist for medications management;Direct supervision/assist for financial management;Assist for transportation;Help with stairs or ramp for entrance;Supervision due to cognitive status   Can travel by private vehicle    No    Equipment Recommendations Hoyer lift;Hospital bed;Wheelchair (measurements PT);Wheelchair cushion (measurements PT)     Functional Status Assessment Patient has had a recent decline in their functional status and demonstrates the ability to make significant improvements in function in a reasonable and  predictable amount of time.     Precautions / Restrictions Precautions Precautions: Fall Restrictions Weight Bearing Restrictions Per Provider Order: No      Mobility  Bed Mobility Overal bed mobility: Needs Assistance Bed Mobility: Sidelying to Sit, Sit to Supine   Sidelying to sit: Total assist, +2 for physical assistance   Sit to supine: Total assist, +2 for physical assistance   General bed mobility comments: TotalAx2 for all aspects, used helicopter method    Transfers Overall transfer level: Needs assistance Equipment used: Rolling walker (2 wheels) Transfers: Sit to/from Stand Sit to Stand: Max assist, +2 physical assistance    General transfer comment: MaxAx2 for boost up, intial rise, and steadying. Heavy posterior lean. Cues to upright posture with little follow through. Able to stand for ~30 sec before pt lowering to bed    Ambulation/Gait    General Gait Details: unable at this time      Balance Overall balance assessment: Needs assistance Sitting-balance support: Feet supported Sitting balance-Leahy Scale: Fair     Standing balance support: During functional activity, Reliant on assistive device for balance Standing balance-Leahy Scale: Poor Standing balance comment: reliant on external support        Pertinent Vitals/Pain Pain Assessment Pain Assessment: No/denies pain    Home Living Family/patient expects to be discharged to:: Private residence Living Arrangements: Non-relatives/Friends Available Help at Discharge: Friend(s);Available PRN/intermittently;Personal care attendant (caregivers, 10am-1pm, 5pm-9pm) Type of Home: House Home Access: Level entry       Home Layout: One level Home Equipment: BSC/3in1;Rollator (4 wheels);Shower seat      Prior Function Prior Level of Function : Needs assist       Physical Assist : Mobility (physical) Mobility (physical): Bed  mobility;Transfers;Gait   Mobility Comments: rollator short  distances, family reports recently caregivers have been offering physical support for bed moblity, transfers, and gait ADLs Comments: assist for ADLs     Extremity/Trunk Assessment   Upper Extremity Assessment Upper Extremity Assessment: Defer to OT evaluation    Lower Extremity Assessment Lower Extremity Assessment: Generalized weakness    Cervical / Trunk Assessment Cervical / Trunk Assessment: Kyphotic  Communication   Communication Communication: Hearing impairment  Cognition Arousal: Alert Behavior During Therapy: WFL for tasks assessed/performed Overall Cognitive Status: Within Functional Limits for tasks assessed    General Comments: HOH and oriented to self at baseline        General Comments General comments (skin integrity, edema, etc.): VSS on RA, BLE swelling     PT Assessment Patient needs continued PT services  PT Problem List Decreased strength;Decreased activity tolerance;Decreased balance;Decreased mobility;Decreased range of motion;Decreased coordination;Decreased safety awareness       PT Treatment Interventions DME instruction;Gait training;Functional mobility training;Therapeutic activities;Therapeutic exercise;Balance training;Neuromuscular re-education;Patient/family education;Wheelchair mobility training    PT Goals (Current goals can be found in the Care Plan section)  Acute Rehab PT Goals Patient Stated Goal: to go home PT Goal Formulation: With family Time For Goal Achievement: 05/27/23 Potential to Achieve Goals: Fair    Frequency Min 1X/week     Co-evaluation   Reason for Co-Treatment: Complexity of the patient's impairments (multi-system involvement);To address functional/ADL transfers;For patient/therapist safety PT goals addressed during session: Mobility/safety with mobility;Balance         AM-PAC PT "6 Clicks" Mobility  Outcome Measure Help needed turning from your back to your side while in a flat bed without using  bedrails?: Total Help needed moving from lying on your back to sitting on the side of a flat bed without using bedrails?: Total Help needed moving to and from a bed to a chair (including a wheelchair)?: Total Help needed standing up from a chair using your arms (e.g., wheelchair or bedside chair)?: Total Help needed to walk in hospital room?: Total Help needed climbing 3-5 steps with a railing? : Total 6 Click Score: 6    End of Session Equipment Utilized During Treatment: Gait belt Activity Tolerance: Patient limited by fatigue Patient left: in bed;with call bell/phone within reach;with bed alarm set;with family/visitor present Nurse Communication: Mobility status PT Visit Diagnosis: Unsteadiness on feet (R26.81);Muscle weakness (generalized) (M62.81)    Time: 6045-4098 PT Time Calculation (min) (ACUTE ONLY): 34 min   Charges:   PT Evaluation $PT Eval Moderate Complexity: 1 Mod   PT General Charges $$ ACUTE PT VISIT: 1 Visit       Hilton Cork, PT, DPT Secure Chat Preferred  Rehab Office [phone]   Arturo Morton Brion Aliment 05/13/2023, 3:35 PM

## 2023-05-13 NOTE — Progress Notes (Signed)
[facility] PROGRESS NOTE  JAHNVI MODLIN (DOB: 1921-08-13) YNW:[id number] PCP: Catha Gosselin, MD  Brief Narrative: TEQULIA FURLAN is a 89 y.o. female with a history of osteoporosis, HTN, CAD, HLD, dementia, limited mobility with caretaker at home, and T2DM who presented to the ED on 05/11/2023 with persistent right leg swelling and change in urine character in setting of poor oral intake. She was admitted for UTI as well as RLE DVT, started on antibiotics and anticoagulation.   Subjective: Eating lunch independently this afternoon, has no complaints. Always has some right eye ptosis and left eye bulging per family, she is at her baseline as far as this goes. Level of alertness has already considerably improved per son and caretaker at bedside.   Objective: BP (!) 135/92 (BP Location: Left Arm)   Pulse 95   Temp (!) 97.5 F (36.4 C) (Oral)   Resp 12   Ht 5\' 2"  (1.575 m)   Wt 51.7 kg   SpO2 98%   BMI 20.85 kg/m   Gen: Elderly, frail female HOH in no distress HEENT: Right eyelid droop improves with command, mild conjunctival injection.  Pulm: Clear, nonlabored  CV: RRR, no MRG GI: Soft, NT, ND, +BS Neuro: Alert and tracks, disoriented, asks about what is in the bedroom pointing to bathroom door. Moves all extremities. Ext: Warm, dry. Bony hypertrophy of knees with small palpable nontender effusions bilaterally. Some ill-defined pain with hip ROM, though no point tenderness bilaterally.  Skin: Scattered ecchymoses   Assessment & Plan: Principal Problem:   Acute encephalopathy Active Problems:   Hypertension   Memory impairment   Type 2 diabetes mellitus with unspecified complications (HCC)   UTI (urinary tract infection)   Right leg DVT (HCC)  Weakness and acute metabolic encephalopathy on chronic dementia due to UTI: Prior Cx showed FQ resistance. Level of alertness has already considerably improved per son and caretaker at bedside.  - Continue empiric antibiotics  pending cultures (urine culture reincubated, blood cultures NGTD), consider deescalation once culture results back, though she's improving on current regimen.   Acute urinary retention:  - Continue foley for now, offer voiding trial either prior to discharge or at urology follow up. Hopefully will resolve with UTI Tx.   Acute right superficial femoral vein and peroneal vein thromboses:  - Continue anticoagulation for probably 3 months. Symptoms already improving on lovenox, plan eliquis at DC  Hyponatremia: Suspect hypovolemic. Improving with IVF.  - Suspect we can stop IVF, repeat BMP pending.   Hypomagnesemia: Supplemented   Osteoarthritis, scattered joint effusions: Exam reassuring against septic joints.  - Continue supportive care. Do not currently suspect bilateral joint infections in pt who is afebrile without leukocytosis, reassuring procalcitonin.  - Tylenol prn  HTN:  - BP stable.   CAD: s/p PCI previously.  - Continue ASA, statin  HLD:  - Continue statin  Glaucoma:  - Continue gtt's  Osteoporosis:  - Continue denosumab q6h as outpatient  DNR: POA.   T2DM:  - Hold metformin, consider not restarting. A1c pending.  - SSI  Tyrone Nine, MD [facility] [email or web address] 05/13/2023, 3:20 PM

## 2023-05-14 DIAGNOSIS — G9340 Encephalopathy, unspecified: Secondary | ICD-10-CM | POA: Diagnosis not present

## 2023-05-14 LAB — GLUCOSE, CAPILLARY
Glucose-Capillary: 116 mg/dL — ABNORMAL HIGH (ref 70–99)
Glucose-Capillary: 149 mg/dL — ABNORMAL HIGH (ref 70–99)

## 2023-05-14 LAB — HEMOGLOBIN A1C
Hgb A1c MFr Bld: 7.1 % — ABNORMAL HIGH (ref 4.8–5.6)
Mean Plasma Glucose: 157 mg/dL

## 2023-05-14 MED ORDER — FOSFOMYCIN TROMETHAMINE 3 G PO PACK
3.0000 g | PACK | Freq: Once | ORAL | Status: DC
  Filled 2023-05-14: qty 3

## 2023-05-14 MED ORDER — APIXABAN 5 MG PO TABS: 5.0000 mg | ORAL_TABLET | Freq: Two times a day (BID) | ORAL | Status: DC

## 2023-05-14 MED ORDER — APIXABAN 5 MG PO TABS: ORAL_TABLET | ORAL | 0 refills | Status: DC

## 2023-05-14 MED ORDER — APIXABAN 5 MG PO TABS
10.0000 mg | ORAL_TABLET | Freq: Two times a day (BID) | ORAL | Status: DC
  Administered 2023-05-14: 10 mg via ORAL
  Filled 2023-05-14: qty 2

## 2023-05-14 NOTE — Progress Notes (Signed)
Physical Therapy Treatment Patient Details Name: Jennifer Gordon MRN: [id number] DOB: [birth year] Today's Date: 05/14/2023   History of Present Illness Pt is a 89 y/o F presenting to ED on 12/13 with RLE swelling and worsening mentation. Admitted for acute encephalopathy 2/2 UTI and acute R LE DVT. PMH includes CAD with prior stent, HTN, Alzheimers, DM2    PT Comments  The pt was agreeable to session, caretaker present and supportive. Pt continues to need totalA of 2 for bed mobility, as she attempted to assist some with UE, pulling on therapist, but did not attempt to move LE to EOB and was not able to complete >25% of movement. Once seated EOB she was able to maintain without UE support, but needed mod-maxA of 2 to complete all sit-stand transfers (completed 5 in session with progressively increased assist as pt fatigued). She was able to take a few small lateral steps towards recliner, but needed maxA of 2 and cues for LE advancement and facilitation for wt shift to take steps. Pt also sitting before arriving at chair despite cues, will benefit from continued skilled PT to decrease burden on caregivers.   Pt's family has been using rollator to push her around in the home, she would benefit from a WC for increased safety with mobility to appointments due to poor activity tolerance and strength for stepping at this time.    If plan is discharge home, recommend the following: A lot of help with walking and/or transfers;A lot of help with bathing/dressing/bathroom;Assistance with cooking/housework;Direct supervision/assist for medications management;Direct supervision/assist for financial management;Assist for transportation;Help with stairs or ramp for entrance;Supervision due to cognitive status   Can travel by private vehicle        Equipment Recommendations  [place] lift;Hospital bed;Wheelchair (measurements PT);Wheelchair cushion (measurements PT)    Recommendations for Other Services        Precautions / Restrictions Precautions Precautions: Fall Precaution Comments: HOH Restrictions Weight Bearing Restrictions Per Provider Order: No     Mobility  Bed Mobility Overal bed mobility: Needs Assistance Bed Mobility: Supine to Sit     Supine to sit: +2 for physical assistance, Total assist     General bed mobility comments: pt attempting to assist some with UE, pulling on therapist, but did not attempt to move LE to EOB and was not able to complete >25% of movement    Transfers Overall transfer level: Needs assistance Equipment used: Rolling walker (2 wheels), 2 person hand held assist Transfers: Sit to/from Stand, Bed to chair/wheelchair/BSC Sit to Stand: Max assist, +2 physical assistance, Mod assist   Step pivot transfers: Max assist, +2 physical assistance       General transfer comment: initially modA of 2 from EOB with HHA and with RW on first attempt, with fatigue pt needing maxA of 2. maxA of 2 to maintain upright and encourage lateral steps to chair, pt sitting before arriving to chair    Ambulation/Gait               General Gait Details: limited to lateral steps towards chair, pt sitting before arriving at chair     Balance Overall balance assessment: Needs assistance Sitting-balance support: Feet supported Sitting balance-Leahy Scale: Fair     Standing balance support: During functional activity, Reliant on assistive device for balance Standing balance-Leahy Scale: Poor Standing balance comment: reliant on external support  Cognition Arousal: Alert Behavior During Therapy: WFL for tasks assessed/performed Overall Cognitive Status: Within Functional Limits for tasks assessed                                 General Comments: HOH and oriented to self at baseline, able to follow simple commands when she can hear instructions. sits early, poor safety awareness        Exercises General  Exercises - Lower Extremity Ankle Circles/Pumps: PROM, Both, 10 reps Heel Slides: PROM, Both, 10 reps, Supine Hip ABduction/ADduction: PROM, Both, 10 reps, Supine    General Comments General comments (skin integrity, edema, etc.): VSS on RA, BLE swelling and swelling to L hand near tape from lab draws      Pertinent Vitals/Pain Pain Assessment Pain Assessment: Faces Faces Pain Scale: Hurts even more Pain Location: any joint with movement, primarily knees and hips Pain Descriptors / Indicators: Discomfort, Grimacing Pain Intervention(s): Limited activity within patient's tolerance, Monitored during session, Repositioned     PT Goals (current goals can now be found in the care plan section) Acute Rehab PT Goals Patient Stated Goal: to go home PT Goal Formulation: With family Time For Goal Achievement: 05/27/23 Potential to Achieve Goals: Fair Progress towards PT goals: Progressing toward goals    Frequency    Min 1X/week       AM-PAC PT "6 Clicks" Mobility   Outcome Measure  Help needed turning from your back to your side while in a flat bed without using bedrails?: Total Help needed moving from lying on your back to sitting on the side of a flat bed without using bedrails?: Total Help needed moving to and from a bed to a chair (including a wheelchair)?: Total Help needed standing up from a chair using your arms (e.g., wheelchair or bedside chair)?: Total Help needed to walk in hospital room?: Total Help needed climbing 3-5 steps with a railing? : Total 6 Click Score: 6    End of Session Equipment Utilized During Treatment: Gait belt Activity Tolerance: Patient limited by fatigue Patient left: with family/visitor present;in chair;with call bell/phone within reach;with chair alarm set;with nursing/sitter in room Nurse Communication: Mobility status (cloth gait belt) PT Visit Diagnosis: Unsteadiness on feet (R26.81);Muscle weakness (generalized) (M62.81)     Time:  9528-4132 PT Time Calculation (min) (ACUTE ONLY): 30 min  Charges:    $Therapeutic Exercise: 8-22 mins $Therapeutic Activity: 8-22 mins PT General Charges $$ ACUTE PT VISIT: 1 Visit                     Vickki Muff, PT, DPT   Acute Rehabilitation Department Office [phone] Secure Chat Communication Preferred   Ronnie Derby 05/14/2023, 1:03 PM

## 2023-05-14 NOTE — Discharge Instructions (Addendum)
Information on my medicine - ELIQUIS (apixaban)  This medication education was reviewed with me or my healthcare representative as part of my discharge preparation.    Why was Eliquis prescribed for you? Eliquis was prescribed to treat blood clots that may have been found in the veins of your legs (deep vein thrombosis) or in your lungs (pulmonary embolism) and to reduce the risk of them occurring again.  What do You need to know about Eliquis ? The starting dose is 10 mg (two 5 mg tablets) taken TWICE daily for the FIRST SEVEN (7) DAYS, then on 05/21/2023  the dose is reduced to ONE 5 mg tablet TWICE daily.  Eliquis may be taken with or without food.   Try to take the dose about the same time in the morning and in the evening. If you have difficulty swallowing the tablet whole please discuss with your pharmacist how to take the medication safely.  Take Eliquis exactly as prescribed and DO NOT stop taking Eliquis without talking to the doctor who prescribed the medication.  Stopping may increase your risk of developing a new blood clot.  Refill your prescription before you run out.  After discharge, you should have regular check-up appointments with your healthcare provider that is prescribing your Eliquis.    What do you do if you miss a dose? If a dose of ELIQUIS is not taken at the scheduled time, take it as soon as possible on the same day and twice-daily administration should be resumed. The dose should not be doubled to make up for a missed dose.  Important Safety Information A possible side effect of Eliquis is bleeding. You should call your healthcare provider right away if you experience any of the following: Bleeding from an injury or your nose that does not stop. Unusual colored urine (red or dark brown) or unusual colored stools (red or black). Unusual bruising for unknown reasons. A serious fall or if you hit your head (even if there is no bleeding).  Some medicines  may interact with Eliquis and might increase your risk of bleeding or clotting while on Eliquis. To help avoid this, consult your healthcare provider or pharmacist prior to using any new prescription or non-prescription medications, including herbals, vitamins, non-steroidal anti-inflammatory drugs (NSAIDs) and supplements.  This website has more information on Eliquis (apixaban): http://www.eliquis.com/eliquis/home

## 2023-05-14 NOTE — Plan of Care (Signed)

## 2023-05-14 NOTE — Progress Notes (Signed)
   Durable Medical Equipment (From admission, onward)        Start     Ordered  05/14/23 1415  For home use only DME Hospital bed  Once      Question Answer Comment Length of Need Lifetime  Patient has (list medical condition): RLE swelling and worsening mentation. Admitted for acute encephalopathy 2/2 UTI and acute R LE DVT. PMH includes CAD with prior stent, HTN, Alzheimers, DM2  The above medical condition requires: Patient requires the ability to reposition frequently  Head must be elevated greater than: 30 degrees  Bed type Semi-electric  Support Surface: Gel Overlay    05/14/23 1415

## 2023-05-14 NOTE — TOC Transition Note (Signed)
Transition of Care [facility]) - Discharge Note   Patient Details  Name: Jennifer Gordon MRN: [id number] Date of Birth: Oct 10, 1921  [facility] CM/SW Contact:  Harriet Masson, RN Phone Number: 05/14/2023, 2:27 PM   Clinical Narrative:    Orders for Home Health and DME. Spoke to patient's son ,Casimiro Needle, and friend, Veronia Beets, regarding transition needs. Choice offered and Eleanore defers to [facility] to find [facility] agency.  Cory with [facility] accepted referral. Patient is agreeable to use in house provider, [facility], for DME needs.  Notified Mitch of bed order. Eleanore will transport home Address, phone number and PCP on file..   Final next level of care: Home w Home Health Services Barriers to Discharge: Barriers Resolved   Patient Goals and CMS Choice Patient states their goals for this hospitalization and ongoing recovery are:: son wants patient to return home CMS Medicare.gov Compare Post Acute Care list provided to:: Patient Represenative (must comment) Choice offered to / list presented to : Adult Children  ownership interest in [facility].provided to:: Adult Children    Discharge Placement  home                     Discharge Plan and Services Additional resources added to the After Visit Summary for                  DME Arranged: Hospital bed DME Agency: [facility] Date DME Agency Contacted: 05/14/23 Time DME Agency Contacted: 1427 Representative spoke with at DME Agency: Mitch HH Arranged: PT, OT HH Agency: [facility] Date [facility] Agency Contacted: 05/14/23 Time HH Agency Contacted: 1427 Representative spoke with at [facility] Agency: Kandee Keen  Social Drivers of Health (SDOH) Interventions SDOH Screenings   Food Insecurity: Patient Unable To Answer (05/12/2023)  Housing: Patient Unable To Answer (05/12/2023)  Transportation Needs: Patient Unable To Answer (05/12/2023)  Utilities: Patient Unable To Answer (05/12/2023)  Tobacco Use:  Low Risk  (05/11/2023)     Readmission Risk Interventions     No data to display

## 2023-05-14 NOTE — Discharge Summary (Signed)
Physician Discharge Summary   Patient: Jennifer Gordon MRN: [id number] DOB: 18-Aug-1921  Admit date:     05/11/2023  Discharge date: 05/14/23  Discharge Physician: Tyrone Nine   PCP: Catha Gosselin, MD   Recommendations at discharge:  Follow up with PCP in 1-2 weeks, suggest recheck CBC and BMP and to continue eliquis for acute DVTs RLE for 3 months.   Discharge Diagnoses: Principal Problem:   Acute encephalopathy Active Problems:   Hypertension   Memory impairment   Type 2 diabetes mellitus with unspecified complications (HCC)   UTI (urinary tract infection)   Right leg DVT [facility])  Hospital Course: Jennifer Gordon is a 89 y.o. female with a history of osteoporosis, HTN, CAD, HLD, dementia, limited mobility with caretaker at home, and T2DM who presented to the ED on 05/11/2023 with persistent right leg swelling and change in urine character in setting of poor oral intake. She was admitted for UTI as well as RLE DVT, started on antibiotics and anticoagulation. She has clinically improved and is stable for discharge having compelted antibiotics and tolerated anticoagulation.   Assessment and Plan: Weakness and acute metabolic encephalopathy on chronic dementia due to UTI: Prior Cx showed FQ resistance. Level of alertness has already considerably improved per son and caretaker at bedside on unasyn, having received 4 days of antibiotics. only 40k E. coli on culture, will give an additional dose of fosfomycin on 12/16 to complete treatment. No leukocytosis, or evidence of sepsis.    Acute urinary retention: Due to UTI, resolved prior to discharge. Confirmed to void without  - Continue foley for now, offer voiding trial either prior to discharge or at urology follow up. Hopefully will resolve with UTI Tx.    Acute right superficial femoral vein and peroneal vein thromboses:  - Continue anticoagulation for probably 3 months. Symptoms already improving on lovenox, plan eliquis at DC, no  bleeding thus far, consider CBC at follow up.  Stage IIIb CKD: Based on available lab values.  - Suggest monitoring BMP at follow up.   Hyponatremia: Suspect hypovolemic. Improved and stabilized.    Hypomagnesemia: Supplemented    Osteoarthritis, bilateral trochanteric bursitis: Exam reassuring against septic joints.  - Continue supportive care. Do not currently suspect bilateral joint infections or abscesses in pt who is afebrile without leukocytosis, reassuring procalcitonin. CT reviewed with orthopedics prior to discharge. Very consistent with uncomplicated bursitis and certainly would not spur intervention at this time.  - Suggest continued orthopedics follow up. - Tylenol prn   HTN:  - BP stable.    CAD: s/p PCI previously.  - Continue ASA, statin   HLD:  - Continue statin   Glaucoma:  - Continue gtt's and ophthalmology follow up   Osteoporosis:  - Continue denosumab q6h as outpatient   DNR: POA.    T2DM: HbA1c 7.1% which is definitely at goal in this 89yo F. - Hold metformin, with marginal CrCl we will not plan to restart   Consultants: Orthopedics curbside consult Procedures performed: None  Disposition: Home Diet recommendation: Regular DISCHARGE MEDICATION: Allergies as of 05/14/2023       Reactions   Atorvastatin Other (See Comments)   Nsaids Other (See Comments)   Cephalexin Rash   Codeine Rash   Angioedema (ALLERGY/intolerance) Patient stated, " I don't remember what happens. I think its a body rash."   Ibandronic Acid Rash   Lipitor [atorvastatin Calcium] Rash   Angioedema (ALLERGY/intolerance) Patient stated, " I don't remember what happens. I think its  a body rash."   Niaspan [niacin Er (antihyperlipidemic)] Rash   Angioedema (ALLERGY/intolerance) Patient stated, " I don't remember what happens. I think its a body rash."   Nitrofurantoin Rash   Ticlid [ticlopidine Hcl] Rash   Angioedema (ALLERGY/intolerance) Pt. Stated, " I can't remember what  happens. I think its a body rash."   Zocor [simvastatin - High Dose] Rash   Angioedema (ALLERGY/intolerance) Patient stated, " I don't remember what happens. I think its a body rash."        Medication List     STOP taking these medications    metFORMIN 1000 MG tablet Commonly known as: GLUCOPHAGE       TAKE these medications    acarbose 25 MG tablet Commonly known as: PRECOSE Take 25 mg by mouth every evening.   acetaminophen 500 MG tablet Commonly known as: TYLENOL Take 1 tablet (500 mg total) by mouth every 4 (four) hours as needed for moderate pain.   apixaban 5 MG Tabs tablet Commonly known as: ELIQUIS Take 2 tablets (10 mg total) by mouth 2 (two) times daily for 7 days, THEN 1 tablet (5 mg total) 2 (two) times daily for 23 days. Start taking on: May 14, 2023   artificial tears ointment Place 1 drop into both eyes as needed (AS DIRECTED FOR DRY EYES).   Aspirin Low Dose 81 MG tablet Generic drug: aspirin EC Take 81 mg by mouth daily.   cromolyn 4 % ophthalmic solution Commonly known as: OPTICROM 1 drop 4 (four) times daily.   cycloSPORINE 0.05 % ophthalmic emulsion Commonly known as: RESTASIS Place 1 drop into both eyes 2 (two) times daily. Use for dry eyes.   dorzolamide-timolol 2-0.5 % ophthalmic solution Commonly known as: COSOPT Place 1 drop into both eyes 2 (two) times daily. Use for glaucoma   latanoprost 0.005 % ophthalmic solution Commonly known as: XALATAN Place 1 drop into both eyes at bedtime.   multivitamin capsule Take 1 capsule by mouth daily. Patient takes Centrum Silver A-Z   OCUSOFT LID SCRUB EX Apply topically 2 (two) times daily. As directed   Prolia 60 MG/ML Sosy injection Generic drug: denosumab Inject 60 mg into the skin every 6 (six) months. September/October was last injection, Caregiver wasn't sure which month.   Refresh 1.4-0.6 % Soln Generic drug: Polyvinyl Alcohol-Povidone PF See admin instructions.    rosuvastatin 10 MG tablet Commonly known as: CRESTOR Take 10 mg by mouth daily.        Follow-up Information     Catha Gosselin, MD Follow up.   Specialty: Family Medicine Contact information: [address] [place] [address] [phone]                Discharge Exam: Ceasar Mons Weights   05/11/23 2152 05/13/23 0500 05/14/23 0500  Weight: 60 kg 51.7 kg 53.9 kg  BP 113/68 (BP Location: Left Arm)   Pulse 89   Temp 97.9 F (36.6 C) (Oral)   Resp 16   Ht 5\' 2"  (1.575 m)   Wt 53.9 kg   SpO2 99%   BMI 21.73 kg/m   Elderly frail female in no distress, very pleasantly confused RLE edema decreased, WWP distally Soft, NT, ND, +BS Post void residual measured at 100cc during voiding trial.  Condition at discharge: stable  The results of significant diagnostics from this hospitalization (including imaging, microbiology, ancillary and laboratory) are listed below for reference.   Imaging Studies: CT VENOGRAM ABD/PELVIS/LOWER EXT BILAT Result Date: 05/12/2023 CLINICAL DATA:  Right leg edema.  Wound on the right heel. EXAM: CT VENOGRAM ABDOMEN AND PELVIS AND LOWER EXTREMITY TECHNIQUE: Venographic phase images of the abdomen, pelvis and lower extremities were obtained following the administration of intravenous contrast. Multiplanar reformats and maximum intensity projections were generated. RADIATION DOSE REDUCTION: This exam was performed according to the departmental dose-optimization program which includes automated exposure control, adjustment of the mA and/or kV according to patient size and/or use of iterative reconstruction technique. CONTRAST:  OMNIPAQUE IOHEXOL 350 MG/ML SOLN COMPARISON:  CT abdomen and pelvis 04/21/2017 FINDINGS: Lower chest: No acute abnormality. Hepatobiliary: No focal liver abnormality is seen. No gallstones, gallbladder wall thickening, or biliary dilatation. Pancreas: Unremarkable. No pancreatic ductal dilatation or surrounding  inflammatory changes. Spleen: Normal in size without focal abnormality. Adrenals/Urinary Tract: There are bilateral renal cysts, left greater than right. The largest cyst in the left kidney measures 5.5 cm. There is no hydronephrosis or perinephric fat stranding. The adrenal glands are within normal limits. The bladder is distended. Stomach/Bowel: Stomach is within normal limits. Appendix is not seen. No evidence of bowel wall thickening, distention, or inflammatory changes. There is sigmoid colon diverticulosis. Vascular/Lymphatic: Aorta is normal in size. There are atherosclerotic calcifications of the aorta. There are no enlarged lymph nodes identified. Reproductive: Uterus and bilateral adnexa are unremarkable. Other: There is a small fat containing umbilical hernia. There is mild body wall edema. No abdominopelvic ascites. Musculoskeletal: The bones are osteopenic. No acute fracture or dislocation identified. There are moderate-sized bilateral knee joint effusions with some peripheral calcifications. There are enhancing fluid collections adjacent to the bilateral greater trochanters of the femurs measuring 5.2 x 2.1 x 4.3 cm on the left and 4.4 by 1.5 by 4.3 cm on right. No underlying osseous erosions. There is soft tissue edema and swelling of the right calf, ankle and foot. IVC: No evidence for stenosis or obvious thrombosis. Limited evaluation secondary to timing of the contrast bolus. Portal and mesenteric veins: No evidence for thrombus or stenosis. Bilateral iliac veins: Not well opacified secondary to timing of the contrast bolus. Right lower extremity: Limited evaluation secondary to timing of the contrast bolus. There is likely a small focal area of nonocclusive thrombus in the right superficial femoral artery image 6/110. There also findings suspicious for thrombus within the right peroneal vein image 6/196. Left lower extremity: Limited evaluation secondary to timing of the contrast bolus. No  definitive areas suspicious for thrombosis. IMPRESSION: 1. Limited evaluation secondary to timing of the contrast bolus. 2. Suspected small focal area of nonocclusive thrombus in the right superficial femoral artery. 3. Suspected thrombus in the right peroneal vein. 4. Moderate-sized bilateral knee joint effusions with some peripheral calcifications. 5. Enhancing fluid collections adjacent to the bilateral greater trochanters of the femurs worrisome for abscesses. No underlying osseous erosions. 6. Soft tissue edema and swelling of the right calf, ankle and foot. 7. Distended bladder. 8. Sigmoid colon diverticulosis. 9. Aortic atherosclerosis. Aortic Atherosclerosis (ICD10-I70.0). Electronically Signed   By: Darliss Cheney M.D.   On: 05/12/2023 02:59   CT Head Wo Contrast Result Date: 05/12/2023 CLINICAL DATA:  Altered mental status EXAM: CT HEAD WITHOUT CONTRAST TECHNIQUE: Contiguous axial images were obtained from the base of the skull through the vertex without intravenous contrast. RADIATION DOSE REDUCTION: This exam was performed according to the departmental dose-optimization program which includes automated exposure control, adjustment of the mA and/or kV according to patient size and/or use of iterative reconstruction technique. COMPARISON:  01/10/2022 FINDINGS: Brain: No evidence  of acute infarction, hemorrhage, hydrocephalus, extra-axial collection or mass lesion/mass effect. Moderate periventricular white matter changes are present, likely reflecting the sequela of small vessel ischemia, stable since prior examination. Moderate diffuse parenchymal atrophy is commensurate with the patient's age and stable since prior examination. Vascular: No asymmetric hyperdense vasculature at the skull base. Advanced vascular calcifications are seen within the terminal vertebral arteries and carotid siphons Skull: Normal. Negative for fracture or focal lesion. Sinuses/Orbits: No acute finding. Other: Mastoid air  cells and middle ear cavities are clear. IMPRESSION: 1. No acute intracranial abnormality. 2. Moderate senescent change. Electronically Signed   By: Helyn Numbers M.D.   On: 05/12/2023 02:50   DG Chest Port 1 View Result Date: 05/11/2023 CLINICAL DATA:  Leg swelling EXAM: PORTABLE CHEST 1 VIEW COMPARISON:  05/10/2023 FINDINGS: The heart size and mediastinal contours are within normal limits. Both lungs are clear. Aortic atherosclerosis. Scoliosis of the spine. IMPRESSION: No active disease. Electronically Signed   By: Jasmine Pang M.D.   On: 05/11/2023 23:11   DG Chest Portable 1 View Result Date: 05/10/2023 CLINICAL DATA:  Weakness EXAM: PORTABLE CHEST 1 VIEW COMPARISON:  None Available. FINDINGS: Single frontal view of the chest demonstrates an unremarkable cardiac silhouette. No airspace disease, effusion, or pneumothorax. Left convex scoliosis at the thoracolumbar junction. No acute bony abnormalities. IMPRESSION: 1. No acute intrathoracic process. Electronically Signed   By: Sharlet Salina M.D.   On: 05/10/2023 18:49   VAS [place] LOWER EXTREMITY VENOUS (DVT) (ONLY [facility] & [facility]) Result Date: 04/25/2023  Lower Venous DVT Study Patient Name:  JERAH CIRCELLI  Date of Exam:   04/25/2023 Medical Rec #: [id number]        Accession #:    [id number] Date of Birth: 1922-01-28       Patient Gender: F Patient Age:   74 years Exam Location:  [facility] Procedure:      VAS [place] LOWER EXTREMITY VENOUS (DVT) Referring Phys: JOSEPH ZAMMIT --------------------------------------------------------------------------------  Indications: Swelling, and Edema.  Limitations: Body habitus and Unable to properly position. Comparison Study: No prior exam. Performing Technologist: Fernande Bras  Examination Guidelines: A complete evaluation includes B-mode imaging, spectral Doppler, color Doppler, and power Doppler as needed of all accessible portions of each vessel. Bilateral testing is considered an integral part of a  complete examination. Limited examinations for reoccurring indications may be performed as noted. The reflux portion of the exam is performed with the patient in reverse Trendelenburg.  +---------+---------------+---------+-----------+----------+--------------+ RIGHT    CompressibilityPhasicitySpontaneityPropertiesThrombus Aging +---------+---------------+---------+-----------+----------+--------------+ CFV      Full           Yes      Yes                                 +---------+---------------+---------+-----------+----------+--------------+ SFJ      Full                                                        +---------+---------------+---------+-----------+----------+--------------+ FV Prox  Full                                                        +---------+---------------+---------+-----------+----------+--------------+  FV Mid   Full                                                        +---------+---------------+---------+-----------+----------+--------------+ FV DistalFull                                                        +---------+---------------+---------+-----------+----------+--------------+ PFV      Full                                                        +---------+---------------+---------+-----------+----------+--------------+ POP      Full           Yes      Yes                                 +---------+---------------+---------+-----------+----------+--------------+ PTV      Full                                                        +---------+---------------+---------+-----------+----------+--------------+ PERO     Full                                                        +---------+---------------+---------+-----------+----------+--------------+   +----+---------------+---------+-----------+----------+--------------+ LEFTCompressibilityPhasicitySpontaneityPropertiesThrombus Aging  +----+---------------+---------+-----------+----------+--------------+ CFV Full           Yes      Yes                                 +----+---------------+---------+-----------+----------+--------------+     Summary: RIGHT: - There is no evidence of deep vein thrombosis in the lower extremity.  - No cystic structure found in the popliteal fossa.  LEFT: - No evidence of common femoral vein obstruction.   *See table(s) above for measurements and observations. Electronically signed by Heath Lark on 04/25/2023 at 4:47:24 PM.    Final    DG Knee Complete 4 Views Right Result Date: 04/25/2023 CLINICAL DATA:  Right lower extremity pain and swelling. EXAM: RIGHT KNEE - COMPLETE 4+ VIEW COMPARISON:  April 20, 2017. FINDINGS: No evidence of fracture, dislocation, or joint effusion. Severe narrowing of medial joint space is noted. Calcifications are noted in the suprapatellar region suggesting possible bursitis. IMPRESSION: Severe degenerative joint disease is noted medially. Calcifications seen in suprapatellar region suggesting calcific bursitis. No fracture or dislocation is noted. Electronically Signed   By: Lupita Raider M.D.   On: 04/25/2023 11:14    Microbiology: Results for orders placed or performed during the hospital encounter of 05/11/23  Urine  Culture     Status: Abnormal (Preliminary result)   Collection Time: 05/11/23  9:46 PM   Specimen: Urine, Random  Result Value Ref Range Status   Specimen Description   Final    URINE, RANDOM Performed at [facility], [address] [place], [place] [address]    Special Requests   Final    NONE Reflexed from [id number] Performed at [facility], [address] [place], [place] [address]    Culture (A)  Final    40,000 COLONIES/mL ESCHERICHIA COLI MIC REPEAT (VITEK) Performed at [facility], [address] [place], [place] [address]    Report Status PENDING  Incomplete  Culture, blood  (Routine X 2) w Reflex to ID Panel     Status: None (Preliminary result)   Collection Time: 05/12/23  9:08 AM   Specimen: BLOOD RIGHT HAND  Result Value Ref Range Status   Specimen Description BLOOD RIGHT HAND  Final   Special Requests   Final    BOTTLES DRAWN AEROBIC AND ANAEROBIC Blood Culture results may not be optimal due to an inadequate volume of blood received in culture bottles   Culture   Final    NO GROWTH 2 DAYS Performed at [facility], [address] [place], [place] [address]    Report Status PENDING  Incomplete  Culture, blood (Routine X 2) w Reflex to ID Panel     Status: None (Preliminary result)   Collection Time: 05/12/23 10:38 AM   Specimen: BLOOD RIGHT ARM  Result Value Ref Range Status   Specimen Description BLOOD RIGHT ARM  Final   Special Requests   Final    BOTTLES DRAWN AEROBIC AND ANAEROBIC Blood Culture results may not be optimal due to an inadequate volume of blood received in culture bottles   Culture   Final    NO GROWTH 2 DAYS Performed at [facility], [address], [place], [place] [address]    Report Status PENDING  Incomplete    Labs: CBC: Recent Labs  Lab 05/11/23 2113 05/12/23 0723 05/13/23 0933  WBC 7.3 5.6 5.1  NEUTROABS 4.9 3.7 3.4  HGB 12.1 12.0 11.9*  HCT 35.8* 36.4 36.1  MCV 85.2 86.5 87.4  PLT 241 301 266   Basic Metabolic Panel: Recent Labs  Lab 05/11/23 2113 05/12/23 0723 05/13/23 1522  NA 128* 133* 132*  K 4.0 3.7 3.8  CL 95* 98 102  CO2 23 22 22   GLUCOSE 160* 140* 147*  BUN 16 11 7*  CREATININE 0.43* 0.52 0.57  CALCIUM 9.5 8.8* 8.2*  MG  --  1.4* 1.6*   Liver Function Tests: Recent Labs  Lab 05/12/23 0723  AST 23  ALT 19  ALKPHOS 94  BILITOT 0.7  PROT 5.7*  ALBUMIN 2.5*   CBG: Recent Labs  Lab 05/13/23 1218 05/13/23 1548 05/13/23 2047 05/14/23 0800 05/14/23 1138  GLUCAP 173* 142* 147* 116* 149*    Discharge time spent: greater than 30 minutes.  Signed: Tyrone Nine,  MD [facility] 05/14/2023

## 2023-05-14 NOTE — Progress Notes (Signed)
   05/14/23 1450  Mobility  Activity  (Transfered from Chair to Transfer Chair)  Level of Assistance Maximum assist, patient does 25-49%  Assistive Device None  Activity Response Tolerated well  Mobility Referral Yes  Mobility visit 1 Mobility  Mobility Specialist Start Time (ACUTE ONLY) 1435  Mobility Specialist Stop Time (ACUTE ONLY) 1450  Mobility Specialist Time Calculation (min) (ACUTE ONLY) 15 min   Mobility Specialist: Progress Note  Pt agreeable to mobility session - received in recliner - RN requested pt to be transferred to transfer chair for D/C. Required MaxA using no AD. Pt was asymptomatic throughout session with no complaints. Returned to transfer chair with all needs met - left with NT.  Barnie Mort, BS Mobility Specialist Please contact via SecureChat or Rehab office at [phone].

## 2023-05-15 LAB — URINE CULTURE: Culture: 40000 — AB

## 2023-05-17 LAB — CULTURE, BLOOD (ROUTINE X 2)
Culture: NO GROWTH
Culture: NO GROWTH

## 2023-05-18 DIAGNOSIS — N390 Urinary tract infection, site not specified: Secondary | ICD-10-CM | POA: Diagnosis not present

## 2023-05-18 DIAGNOSIS — L89322 Pressure ulcer of left buttock, stage 2: Secondary | ICD-10-CM | POA: Diagnosis not present

## 2023-05-18 DIAGNOSIS — N1832 Chronic kidney disease, stage 3b: Secondary | ICD-10-CM | POA: Diagnosis not present

## 2023-05-18 DIAGNOSIS — G9341 Metabolic encephalopathy: Secondary | ICD-10-CM | POA: Diagnosis not present

## 2023-05-18 DIAGNOSIS — I82411 Acute embolism and thrombosis of right femoral vein: Secondary | ICD-10-CM | POA: Diagnosis not present

## 2023-05-18 DIAGNOSIS — G309 Alzheimer's disease, unspecified: Secondary | ICD-10-CM | POA: Diagnosis not present

## 2023-05-18 DIAGNOSIS — K5730 Diverticulosis of large intestine without perforation or abscess without bleeding: Secondary | ICD-10-CM | POA: Diagnosis not present

## 2023-05-18 DIAGNOSIS — I129 Hypertensive chronic kidney disease with stage 1 through stage 4 chronic kidney disease, or unspecified chronic kidney disease: Secondary | ICD-10-CM | POA: Diagnosis not present

## 2023-05-18 DIAGNOSIS — I82451 Acute embolism and thrombosis of right peroneal vein: Secondary | ICD-10-CM | POA: Diagnosis not present

## 2023-05-18 DIAGNOSIS — L89312 Pressure ulcer of right buttock, stage 2: Secondary | ICD-10-CM | POA: Diagnosis not present

## 2023-05-18 DIAGNOSIS — E1122 Type 2 diabetes mellitus with diabetic chronic kidney disease: Secondary | ICD-10-CM | POA: Diagnosis not present

## 2023-05-22 DIAGNOSIS — G9341 Metabolic encephalopathy: Secondary | ICD-10-CM | POA: Diagnosis not present

## 2023-05-22 DIAGNOSIS — I82451 Acute embolism and thrombosis of right peroneal vein: Secondary | ICD-10-CM | POA: Diagnosis not present

## 2023-05-22 DIAGNOSIS — I82411 Acute embolism and thrombosis of right femoral vein: Secondary | ICD-10-CM | POA: Diagnosis not present

## 2023-05-22 DIAGNOSIS — N390 Urinary tract infection, site not specified: Secondary | ICD-10-CM | POA: Diagnosis not present

## 2023-05-22 DIAGNOSIS — L89312 Pressure ulcer of right buttock, stage 2: Secondary | ICD-10-CM | POA: Diagnosis not present

## 2023-05-28 DIAGNOSIS — Z7409 Other reduced mobility: Secondary | ICD-10-CM | POA: Diagnosis not present

## 2023-05-28 DIAGNOSIS — Z9189 Other specified personal risk factors, not elsewhere classified: Secondary | ICD-10-CM | POA: Diagnosis not present

## 2023-05-28 DIAGNOSIS — L89620 Pressure ulcer of left heel, unstageable: Secondary | ICD-10-CM | POA: Diagnosis not present

## 2023-05-28 DIAGNOSIS — E782 Mixed hyperlipidemia: Secondary | ICD-10-CM | POA: Diagnosis not present

## 2023-05-28 DIAGNOSIS — L89152 Pressure ulcer of sacral region, stage 2: Secondary | ICD-10-CM | POA: Diagnosis not present

## 2023-05-28 DIAGNOSIS — H409 Unspecified glaucoma: Secondary | ICD-10-CM | POA: Diagnosis not present

## 2023-05-28 DIAGNOSIS — H04129 Dry eye syndrome of unspecified lacrimal gland: Secondary | ICD-10-CM | POA: Diagnosis not present

## 2023-05-28 DIAGNOSIS — Z86718 Personal history of other venous thrombosis and embolism: Secondary | ICD-10-CM | POA: Diagnosis not present

## 2023-05-28 DIAGNOSIS — E11621 Type 2 diabetes mellitus with foot ulcer: Secondary | ICD-10-CM | POA: Diagnosis not present

## 2023-05-28 DIAGNOSIS — I700 Atherosclerosis of aorta: Secondary | ICD-10-CM | POA: Diagnosis not present

## 2023-05-29 DIAGNOSIS — N390 Urinary tract infection, site not specified: Secondary | ICD-10-CM | POA: Diagnosis not present

## 2023-05-29 DIAGNOSIS — I82411 Acute embolism and thrombosis of right femoral vein: Secondary | ICD-10-CM | POA: Diagnosis not present

## 2023-05-29 DIAGNOSIS — I82451 Acute embolism and thrombosis of right peroneal vein: Secondary | ICD-10-CM | POA: Diagnosis not present

## 2023-05-29 DIAGNOSIS — L89312 Pressure ulcer of right buttock, stage 2: Secondary | ICD-10-CM | POA: Diagnosis not present

## 2023-05-29 DIAGNOSIS — G9341 Metabolic encephalopathy: Secondary | ICD-10-CM | POA: Diagnosis not present

## 2023-06-01 DIAGNOSIS — I82411 Acute embolism and thrombosis of right femoral vein: Secondary | ICD-10-CM | POA: Diagnosis not present

## 2023-06-01 DIAGNOSIS — N390 Urinary tract infection, site not specified: Secondary | ICD-10-CM | POA: Diagnosis not present

## 2023-06-01 DIAGNOSIS — L89312 Pressure ulcer of right buttock, stage 2: Secondary | ICD-10-CM | POA: Diagnosis not present

## 2023-06-01 DIAGNOSIS — G9341 Metabolic encephalopathy: Secondary | ICD-10-CM | POA: Diagnosis not present

## 2023-06-01 DIAGNOSIS — I82451 Acute embolism and thrombosis of right peroneal vein: Secondary | ICD-10-CM | POA: Diagnosis not present

## 2023-06-04 DIAGNOSIS — L89312 Pressure ulcer of right buttock, stage 2: Secondary | ICD-10-CM | POA: Diagnosis not present

## 2023-06-04 DIAGNOSIS — N390 Urinary tract infection, site not specified: Secondary | ICD-10-CM | POA: Diagnosis not present

## 2023-06-04 DIAGNOSIS — G9341 Metabolic encephalopathy: Secondary | ICD-10-CM | POA: Diagnosis not present

## 2023-06-04 DIAGNOSIS — I82451 Acute embolism and thrombosis of right peroneal vein: Secondary | ICD-10-CM | POA: Diagnosis not present

## 2023-06-04 DIAGNOSIS — I82411 Acute embolism and thrombosis of right femoral vein: Secondary | ICD-10-CM | POA: Diagnosis not present

## 2023-06-05 DIAGNOSIS — N390 Urinary tract infection, site not specified: Secondary | ICD-10-CM | POA: Diagnosis not present

## 2023-06-05 DIAGNOSIS — L89312 Pressure ulcer of right buttock, stage 2: Secondary | ICD-10-CM | POA: Diagnosis not present

## 2023-06-05 DIAGNOSIS — G9341 Metabolic encephalopathy: Secondary | ICD-10-CM | POA: Diagnosis not present

## 2023-06-05 DIAGNOSIS — I82451 Acute embolism and thrombosis of right peroneal vein: Secondary | ICD-10-CM | POA: Diagnosis not present

## 2023-06-05 DIAGNOSIS — I82411 Acute embolism and thrombosis of right femoral vein: Secondary | ICD-10-CM | POA: Diagnosis not present

## 2023-06-06 DIAGNOSIS — I82411 Acute embolism and thrombosis of right femoral vein: Secondary | ICD-10-CM | POA: Diagnosis not present

## 2023-06-06 DIAGNOSIS — I82451 Acute embolism and thrombosis of right peroneal vein: Secondary | ICD-10-CM | POA: Diagnosis not present

## 2023-06-06 DIAGNOSIS — N390 Urinary tract infection, site not specified: Secondary | ICD-10-CM | POA: Diagnosis not present

## 2023-06-06 DIAGNOSIS — L89312 Pressure ulcer of right buttock, stage 2: Secondary | ICD-10-CM | POA: Diagnosis not present

## 2023-06-06 DIAGNOSIS — G9341 Metabolic encephalopathy: Secondary | ICD-10-CM | POA: Diagnosis not present

## 2023-06-07 DIAGNOSIS — N390 Urinary tract infection, site not specified: Secondary | ICD-10-CM | POA: Diagnosis not present

## 2023-06-07 DIAGNOSIS — I82451 Acute embolism and thrombosis of right peroneal vein: Secondary | ICD-10-CM | POA: Diagnosis not present

## 2023-06-07 DIAGNOSIS — L89312 Pressure ulcer of right buttock, stage 2: Secondary | ICD-10-CM | POA: Diagnosis not present

## 2023-06-07 DIAGNOSIS — I82411 Acute embolism and thrombosis of right femoral vein: Secondary | ICD-10-CM | POA: Diagnosis not present

## 2023-06-07 DIAGNOSIS — G9341 Metabolic encephalopathy: Secondary | ICD-10-CM | POA: Diagnosis not present

## 2023-06-08 DIAGNOSIS — G9341 Metabolic encephalopathy: Secondary | ICD-10-CM | POA: Diagnosis not present

## 2023-06-08 DIAGNOSIS — I82451 Acute embolism and thrombosis of right peroneal vein: Secondary | ICD-10-CM | POA: Diagnosis not present

## 2023-06-08 DIAGNOSIS — L89312 Pressure ulcer of right buttock, stage 2: Secondary | ICD-10-CM | POA: Diagnosis not present

## 2023-06-08 DIAGNOSIS — I82411 Acute embolism and thrombosis of right femoral vein: Secondary | ICD-10-CM | POA: Diagnosis not present

## 2023-06-08 DIAGNOSIS — N390 Urinary tract infection, site not specified: Secondary | ICD-10-CM | POA: Diagnosis not present

## 2023-06-12 DIAGNOSIS — I82451 Acute embolism and thrombosis of right peroneal vein: Secondary | ICD-10-CM | POA: Diagnosis not present

## 2023-06-12 DIAGNOSIS — I82411 Acute embolism and thrombosis of right femoral vein: Secondary | ICD-10-CM | POA: Diagnosis not present

## 2023-06-12 DIAGNOSIS — G9341 Metabolic encephalopathy: Secondary | ICD-10-CM | POA: Diagnosis not present

## 2023-06-12 DIAGNOSIS — N390 Urinary tract infection, site not specified: Secondary | ICD-10-CM | POA: Diagnosis not present

## 2023-06-12 DIAGNOSIS — L89312 Pressure ulcer of right buttock, stage 2: Secondary | ICD-10-CM | POA: Diagnosis not present

## 2023-06-15 DIAGNOSIS — I82411 Acute embolism and thrombosis of right femoral vein: Secondary | ICD-10-CM | POA: Diagnosis not present

## 2023-06-15 DIAGNOSIS — N390 Urinary tract infection, site not specified: Secondary | ICD-10-CM | POA: Diagnosis not present

## 2023-06-15 DIAGNOSIS — G9341 Metabolic encephalopathy: Secondary | ICD-10-CM | POA: Diagnosis not present

## 2023-06-15 DIAGNOSIS — L89312 Pressure ulcer of right buttock, stage 2: Secondary | ICD-10-CM | POA: Diagnosis not present

## 2023-06-15 DIAGNOSIS — I82451 Acute embolism and thrombosis of right peroneal vein: Secondary | ICD-10-CM | POA: Diagnosis not present

## 2023-06-18 DIAGNOSIS — G9341 Metabolic encephalopathy: Secondary | ICD-10-CM | POA: Diagnosis not present

## 2023-06-18 DIAGNOSIS — N390 Urinary tract infection, site not specified: Secondary | ICD-10-CM | POA: Diagnosis not present

## 2023-06-18 DIAGNOSIS — L89312 Pressure ulcer of right buttock, stage 2: Secondary | ICD-10-CM | POA: Diagnosis not present

## 2023-06-18 DIAGNOSIS — I82451 Acute embolism and thrombosis of right peroneal vein: Secondary | ICD-10-CM | POA: Diagnosis not present

## 2023-06-18 DIAGNOSIS — I82411 Acute embolism and thrombosis of right femoral vein: Secondary | ICD-10-CM | POA: Diagnosis not present

## 2023-06-21 DIAGNOSIS — I82411 Acute embolism and thrombosis of right femoral vein: Secondary | ICD-10-CM | POA: Diagnosis not present

## 2023-06-21 DIAGNOSIS — G9341 Metabolic encephalopathy: Secondary | ICD-10-CM | POA: Diagnosis not present

## 2023-06-21 DIAGNOSIS — L89312 Pressure ulcer of right buttock, stage 2: Secondary | ICD-10-CM | POA: Diagnosis not present

## 2023-06-21 DIAGNOSIS — I82451 Acute embolism and thrombosis of right peroneal vein: Secondary | ICD-10-CM | POA: Diagnosis not present

## 2023-06-21 DIAGNOSIS — N390 Urinary tract infection, site not specified: Secondary | ICD-10-CM | POA: Diagnosis not present

## 2023-06-22 ENCOUNTER — Encounter (HOSPITAL_BASED_OUTPATIENT_CLINIC_OR_DEPARTMENT_OTHER): Payer: Medicare Other | Attending: Internal Medicine | Admitting: Internal Medicine

## 2023-06-22 DIAGNOSIS — L89154 Pressure ulcer of sacral region, stage 4: Secondary | ICD-10-CM | POA: Insufficient documentation

## 2023-06-22 DIAGNOSIS — L89610 Pressure ulcer of right heel, unstageable: Secondary | ICD-10-CM | POA: Insufficient documentation

## 2023-06-22 DIAGNOSIS — F0390 Unspecified dementia without behavioral disturbance: Secondary | ICD-10-CM | POA: Insufficient documentation

## 2023-06-25 DIAGNOSIS — I82451 Acute embolism and thrombosis of right peroneal vein: Secondary | ICD-10-CM | POA: Diagnosis not present

## 2023-06-25 DIAGNOSIS — I82411 Acute embolism and thrombosis of right femoral vein: Secondary | ICD-10-CM | POA: Diagnosis not present

## 2023-06-25 DIAGNOSIS — L89312 Pressure ulcer of right buttock, stage 2: Secondary | ICD-10-CM | POA: Diagnosis not present

## 2023-06-25 DIAGNOSIS — G9341 Metabolic encephalopathy: Secondary | ICD-10-CM | POA: Diagnosis not present

## 2023-06-25 DIAGNOSIS — N390 Urinary tract infection, site not specified: Secondary | ICD-10-CM | POA: Diagnosis not present

## 2023-06-29 DIAGNOSIS — I82411 Acute embolism and thrombosis of right femoral vein: Secondary | ICD-10-CM | POA: Diagnosis not present

## 2023-06-29 DIAGNOSIS — G9341 Metabolic encephalopathy: Secondary | ICD-10-CM | POA: Diagnosis not present

## 2023-06-29 DIAGNOSIS — L89312 Pressure ulcer of right buttock, stage 2: Secondary | ICD-10-CM | POA: Diagnosis not present

## 2023-06-29 DIAGNOSIS — N390 Urinary tract infection, site not specified: Secondary | ICD-10-CM | POA: Diagnosis not present

## 2023-06-29 DIAGNOSIS — I82451 Acute embolism and thrombosis of right peroneal vein: Secondary | ICD-10-CM | POA: Diagnosis not present

## 2023-07-03 DIAGNOSIS — I82411 Acute embolism and thrombosis of right femoral vein: Secondary | ICD-10-CM | POA: Diagnosis not present

## 2023-07-03 DIAGNOSIS — L89154 Pressure ulcer of sacral region, stage 4: Secondary | ICD-10-CM | POA: Diagnosis not present

## 2023-07-03 DIAGNOSIS — L89610 Pressure ulcer of right heel, unstageable: Secondary | ICD-10-CM | POA: Diagnosis not present

## 2023-07-03 DIAGNOSIS — L89324 Pressure ulcer of left buttock, stage 4: Secondary | ICD-10-CM | POA: Diagnosis not present

## 2023-07-03 DIAGNOSIS — I82451 Acute embolism and thrombosis of right peroneal vein: Secondary | ICD-10-CM | POA: Diagnosis not present

## 2023-07-03 DIAGNOSIS — L89613 Pressure ulcer of right heel, stage 3: Secondary | ICD-10-CM | POA: Diagnosis not present

## 2023-07-03 DIAGNOSIS — G9341 Metabolic encephalopathy: Secondary | ICD-10-CM | POA: Diagnosis not present

## 2023-07-05 DIAGNOSIS — I82411 Acute embolism and thrombosis of right femoral vein: Secondary | ICD-10-CM | POA: Diagnosis not present

## 2023-07-05 DIAGNOSIS — I82451 Acute embolism and thrombosis of right peroneal vein: Secondary | ICD-10-CM | POA: Diagnosis not present

## 2023-07-05 DIAGNOSIS — L89613 Pressure ulcer of right heel, stage 3: Secondary | ICD-10-CM | POA: Diagnosis not present

## 2023-07-05 DIAGNOSIS — L89324 Pressure ulcer of left buttock, stage 4: Secondary | ICD-10-CM | POA: Diagnosis not present

## 2023-07-05 DIAGNOSIS — G9341 Metabolic encephalopathy: Secondary | ICD-10-CM | POA: Diagnosis not present

## 2023-07-06 ENCOUNTER — Encounter (HOSPITAL_BASED_OUTPATIENT_CLINIC_OR_DEPARTMENT_OTHER): Payer: Medicare Other | Attending: Internal Medicine | Admitting: Internal Medicine

## 2023-07-06 DIAGNOSIS — F0390 Unspecified dementia without behavioral disturbance: Secondary | ICD-10-CM | POA: Diagnosis not present

## 2023-07-06 DIAGNOSIS — L89154 Pressure ulcer of sacral region, stage 4: Secondary | ICD-10-CM | POA: Diagnosis not present

## 2023-07-06 DIAGNOSIS — L89610 Pressure ulcer of right heel, unstageable: Secondary | ICD-10-CM | POA: Diagnosis not present

## 2023-07-10 DIAGNOSIS — L89324 Pressure ulcer of left buttock, stage 4: Secondary | ICD-10-CM | POA: Diagnosis not present

## 2023-07-10 DIAGNOSIS — I82411 Acute embolism and thrombosis of right femoral vein: Secondary | ICD-10-CM | POA: Diagnosis not present

## 2023-07-10 DIAGNOSIS — G9341 Metabolic encephalopathy: Secondary | ICD-10-CM | POA: Diagnosis not present

## 2023-07-10 DIAGNOSIS — I82451 Acute embolism and thrombosis of right peroneal vein: Secondary | ICD-10-CM | POA: Diagnosis not present

## 2023-07-10 DIAGNOSIS — L89613 Pressure ulcer of right heel, stage 3: Secondary | ICD-10-CM | POA: Diagnosis not present

## 2023-07-13 DIAGNOSIS — I82411 Acute embolism and thrombosis of right femoral vein: Secondary | ICD-10-CM | POA: Diagnosis not present

## 2023-07-13 DIAGNOSIS — I82451 Acute embolism and thrombosis of right peroneal vein: Secondary | ICD-10-CM | POA: Diagnosis not present

## 2023-07-13 DIAGNOSIS — L89613 Pressure ulcer of right heel, stage 3: Secondary | ICD-10-CM | POA: Diagnosis not present

## 2023-07-13 DIAGNOSIS — L89324 Pressure ulcer of left buttock, stage 4: Secondary | ICD-10-CM | POA: Diagnosis not present

## 2023-07-13 DIAGNOSIS — G9341 Metabolic encephalopathy: Secondary | ICD-10-CM | POA: Diagnosis not present

## 2023-07-15 ENCOUNTER — Encounter (HOSPITAL_BASED_OUTPATIENT_CLINIC_OR_DEPARTMENT_OTHER): Payer: Self-pay

## 2023-07-15 ENCOUNTER — Other Ambulatory Visit: Payer: Self-pay

## 2023-07-15 DIAGNOSIS — E119 Type 2 diabetes mellitus without complications: Secondary | ICD-10-CM | POA: Diagnosis not present

## 2023-07-15 DIAGNOSIS — K5641 Fecal impaction: Secondary | ICD-10-CM | POA: Insufficient documentation

## 2023-07-15 DIAGNOSIS — I10 Essential (primary) hypertension: Secondary | ICD-10-CM | POA: Diagnosis not present

## 2023-07-15 DIAGNOSIS — Z85828 Personal history of other malignant neoplasm of skin: Secondary | ICD-10-CM | POA: Insufficient documentation

## 2023-07-15 DIAGNOSIS — R319 Hematuria, unspecified: Secondary | ICD-10-CM | POA: Diagnosis present

## 2023-07-15 DIAGNOSIS — Z955 Presence of coronary angioplasty implant and graft: Secondary | ICD-10-CM | POA: Insufficient documentation

## 2023-07-15 DIAGNOSIS — N3001 Acute cystitis with hematuria: Secondary | ICD-10-CM | POA: Insufficient documentation

## 2023-07-15 NOTE — ED Triage Notes (Signed)
Pt POV with son and friend d/t bleeding - friend thinks it is vaginal but "may be Hematuria".  Pt denies pain.

## 2023-07-16 ENCOUNTER — Emergency Department (HOSPITAL_BASED_OUTPATIENT_CLINIC_OR_DEPARTMENT_OTHER)
Admission: EM | Admit: 2023-07-16 | Discharge: 2023-07-16 | Disposition: A | Discharge status: Home / Self Care | Payer: Medicare Other | Attending: Emergency Medicine | Admitting: Emergency Medicine

## 2023-07-16 DIAGNOSIS — N3001 Acute cystitis with hematuria: Secondary | ICD-10-CM

## 2023-07-16 DIAGNOSIS — L89324 Pressure ulcer of left buttock, stage 4: Secondary | ICD-10-CM | POA: Diagnosis not present

## 2023-07-16 DIAGNOSIS — I82451 Acute embolism and thrombosis of right peroneal vein: Secondary | ICD-10-CM | POA: Diagnosis not present

## 2023-07-16 DIAGNOSIS — L89613 Pressure ulcer of right heel, stage 3: Secondary | ICD-10-CM | POA: Diagnosis not present

## 2023-07-16 DIAGNOSIS — I82411 Acute embolism and thrombosis of right femoral vein: Secondary | ICD-10-CM | POA: Diagnosis not present

## 2023-07-16 DIAGNOSIS — K5641 Fecal impaction: Secondary | ICD-10-CM

## 2023-07-16 DIAGNOSIS — G9341 Metabolic encephalopathy: Secondary | ICD-10-CM | POA: Diagnosis not present

## 2023-07-16 LAB — COMPREHENSIVE METABOLIC PANEL
ALT: 15 U/L (ref 0–44)
AST: 22 U/L (ref 15–41)
Albumin: 3.1 g/dL — ABNORMAL LOW (ref 3.5–5.0)
Alkaline Phosphatase: 83 U/L (ref 38–126)
Anion gap: 7 (ref 5–15)
BUN: 12 mg/dL (ref 8–23)
CO2: 26 mmol/L (ref 22–32)
Calcium: 9 mg/dL (ref 8.9–10.3)
Chloride: 97 mmol/L — ABNORMAL LOW (ref 98–111)
Creatinine, Ser: 0.33 mg/dL — ABNORMAL LOW (ref 0.44–1.00)
GFR, Estimated: >60 mL/min (ref >60)
Glucose, Bld: 246 mg/dL — ABNORMAL HIGH (ref 70–99)
Potassium: 4 mmol/L (ref 3.5–5.1)
Sodium: 130 mmol/L — ABNORMAL LOW (ref 135–145)
Total Bilirubin: 0.5 mg/dL (ref 0.0–1.2)
Total Protein: 6.9 g/dL (ref 6.5–8.1)

## 2023-07-16 LAB — CBC WITH DIFFERENTIAL/PLATELET
Abs Immature Granulocytes: 0.03 10*3/uL (ref 0.00–0.07)
Basophils Absolute: 0.1 10*3/uL (ref 0.0–0.1)
Basophils Relative: 1 %
Eosinophils Absolute: 0.2 10*3/uL (ref 0.0–0.5)
Eosinophils Relative: 2 %
HCT: 35.2 % — ABNORMAL LOW (ref 36.0–46.0)
Hemoglobin: 11.3 g/dL — ABNORMAL LOW (ref 12.0–15.0)
Immature Granulocytes: 0 %
Lymphocytes Relative: 25 %
Lymphs Abs: 2 10*3/uL (ref 0.7–4.0)
MCH: 27.2 pg (ref 26.0–34.0)
MCHC: 32.1 g/dL (ref 30.0–36.0)
MCV: 84.6 fL (ref 80.0–100.0)
Monocytes Absolute: 0.7 10*3/uL (ref 0.1–1.0)
Monocytes Relative: 9 %
Neutro Abs: 5 10*3/uL (ref 1.7–7.7)
Neutrophils Relative %: 63 %
Platelets: 330 10*3/uL (ref 150–400)
RBC: 4.16 MIL/uL (ref 3.87–5.11)
RDW: 16.4 % — ABNORMAL HIGH (ref 11.5–15.5)
WBC: 8 10*3/uL (ref 4.0–10.5)
nRBC: 0 % (ref 0.0–0.2)

## 2023-07-16 LAB — URINALYSIS, ROUTINE W REFLEX MICROSCOPIC
Bilirubin Urine: NEGATIVE
Glucose, UA: 250 mg/dL — AB
Ketones, ur: NEGATIVE mg/dL
Nitrite: NEGATIVE
Protein, ur: 30 mg/dL — AB
RBC / HPF: >50 RBC/hpf (ref 0–5)
Specific Gravity, Urine: 1.015 (ref 1.005–1.030)
WBC, UA: >50 WBC/hpf (ref 0–5)
pH: 5.5 (ref 5.0–8.0)

## 2023-07-16 MED ORDER — SULFAMETHOXAZOLE-TRIMETHOPRIM 800-160 MG PO TABS: 1.0000 | ORAL_TABLET | Freq: Two times a day (BID) | ORAL | 0 refills | Status: AC

## 2023-07-16 MED ORDER — SULFAMETHOXAZOLE-TRIMETHOPRIM 800-160 MG PO TABS
1.0000 | ORAL_TABLET | Freq: Once | ORAL | Status: AC
  Administered 2023-07-16: 1 via ORAL
  Filled 2023-07-16: qty 1

## 2023-07-16 MED ORDER — POLYETHYLENE GLYCOL 3350 17 G PO PACK
17.0000 g | PACK | Freq: Every day | ORAL | 1 refills | Status: AC
Start: 2023-07-16 — End: ?

## 2023-07-16 NOTE — ED Provider Notes (Signed)
[facility] EMERGENCY [facility] Emergency Department Provider Note MRN:  [id number]  Arrival date & time: 07/16/23     Chief Complaint   Vaginal Bleeding   History of Present Illness   Jennifer Gordon is a 89 y.o. year-old female with a history of hypertension, diabetes presenting to the ED with chief complaint of vaginal bleeding.  Bleeding coming from the vaginal area, was filling the toilet.  Unsure if vaginal or from the urine.  Family does not think it is coming from the stool though she has not had a bowel movement in 2 days.  Review of Systems  A thorough review of systems was obtained and all systems are negative except as noted in the HPI and PMH.   Patient's Health History    Past Medical History:  Diagnosis Date   Diabetes mellitus    High cholesterol    Hypertension    S/P angioplasty with stent    #3 stents placed,    Skin cancer of nose     Past Surgical History:  Procedure Laterality Date   APPENDECTOMY     TONSILLECTOMY      Family History  Problem Relation Age of Onset   Hypertension Brother    Heart attack Mother    Stroke Neg Hx     Social History   Socioeconomic History   Marital status: Single    Spouse name: Not on file   Number of children: Not on file   Years of education: Not on file   Highest education level: Not on file  Occupational History   Not on file  Tobacco Use   Smoking status: Never   Smokeless tobacco: Never  Vaping Use   Vaping status: Never Used  Substance and Sexual Activity   Alcohol use: No   Drug use: No   Sexual activity: Never  Other Topics Concern   Not on file  Social History Narrative   Not on file   Social Drivers of Health   Financial Resource Strain: Not on file  Food Insecurity: Patient Unable To Answer (05/12/2023)   Hunger Vital Sign    Worried About Running Out of Food in the Last Year: Patient unable to answer    Ran Out of Food in the Last Year: Patient unable to answer  Transportation  Needs: Patient Unable To Answer (05/12/2023)   PRAPARE - Transportation    Lack of Transportation (Medical): Patient unable to answer    Lack of Transportation (Non-Medical): Patient unable to answer  Physical Activity: Not on file  Stress: Not on file  Social Connections: Not on file  Intimate Partner Violence: Patient Unable To Answer (05/12/2023)   Humiliation, Afraid, Rape, and Kick questionnaire    Fear of Current or Ex-Partner: Patient unable to answer    Emotionally Abused: Patient unable to answer    Physically Abused: Patient unable to answer    Sexually Abused: Patient unable to answer     Physical Exam   Vitals:   07/15/23 2353  BP: (!) 135/90  Pulse: 80  Resp: 17  Temp: 97.6 F (36.4 C)  SpO2: 100%    CONSTITUTIONAL: Well-appearing, NAD NEURO/PSYCH:  Alert, hard of hearing, oriented to name, moves all extremities EYES:  eyes equal and reactive ENT/NECK:  no LAD, no JVD CARDIO: Regular rate, well-perfused, normal S1 and S2 PULM:  CTAB no wheezing or rhonchi GI/GU:  non-distended, non-tender MSK/SPINE:  No gross deformities, no edema SKIN:  no rash, atraumatic   *Additional  and/or pertinent findings included in MDM below  Diagnostic and Interventional Summary    EKG Interpretation Date/Time:    Ventricular Rate:    PR Interval:    QRS Duration:    QT Interval:    QTC Calculation:   R Axis:      Text Interpretation:         Labs Reviewed  URINALYSIS, ROUTINE W REFLEX MICROSCOPIC - Abnormal; Notable for the following components:      Result Value   APPearance CLOUDY (*)    Glucose, UA 250 (*)    Hgb urine dipstick LARGE (*)    Protein, ur 30 (*)    Leukocytes,Ua MODERATE (*)    Bacteria, UA MANY (*)    All other components within normal limits  CBC WITH DIFFERENTIAL/PLATELET - Abnormal; Notable for the following components:   Hemoglobin 11.3 (*)    HCT 35.2 (*)    RDW 16.4 (*)    All other components within normal limits  COMPREHENSIVE  METABOLIC PANEL - Abnormal; Notable for the following components:   Sodium 130 (*)    Chloride 97 (*)    Glucose, Bld 246 (*)    Creatinine, Ser 0.33 (*)    Albumin 3.1 (*)    All other components within normal limits    No orders to display    Medications  sulfamethoxazole-trimethoprim (BACTRIM DS) 800-160 MG per tablet 1 tablet (has no administration in time range)     Procedures  /  Critical Care .Fecal disimpaction  Date/Time: 07/16/2023 1:58 AM  Performed by: Sabas Sous, MD Authorized by: Sabas Sous, MD  Consent: Verbal consent obtained. Risks and benefits: risks, benefits and alternatives were discussed Consent given by: patient and guardian Patient understanding: patient states understanding of the procedure being performed Patient identity confirmed: verbally with patient Time out: Immediately prior to procedure a "time out" was called to verify the correct patient, procedure, equipment, support staff and site/side marked as required.  Sedation: Patient sedated: no  Patient tolerance: patient tolerated the procedure well with no immediate complications Comments: Large amount of firm stool removed from the rectal vault     ED Course and Medical Decision Making  Initial Impression and Ddx Exam looks most consistent with hematuria, bimanual exam without blood.  Rectal exam reveals impaction of stool, patient was disimpacted without issue.  Not having any fever, no abdominal pain or tenderness, overall well-appearing with normal vitals, nothing to suggest systemic disease.  Suspect hematuria related to UTI.  Patient is also anticoagulated which could be exacerbating the bleeding  Past medical/surgical history that increases complexity of ED encounter: DVT  Interpretation of Diagnostics I personally reviewed the laboratory assessment and my interpretation is as follows: No significant blood count or electrolyte disturbance  Urinalysis consistent with  infection  Patient Reassessment and Ultimate Disposition/Management     Recent urine culture data reviewed, patient has cephalexin allergy.  It appears that most recent UTI was sensitive to Bactrim, prescribing for home, appropriate for discharge.  Patient management required discussion with the following services or consulting groups:  None  Complexity of Problems Addressed Acute illness or injury that poses threat of life of bodily function  Additional Data Reviewed and Analyzed Further history obtained from: Further history from spouse/family member  Additional Factors Impacting ED Encounter Risk Prescriptions  Elmer Sow. Pilar Plate, MD [facility] Emergency Medicine [facility] [email or web address]  Final Clinical Impressions(s) / ED Diagnoses     ICD-10-CM  1. Acute cystitis with hematuria  N30.01     2. Fecal impaction (HCC)  K56.41       ED Discharge Orders          Ordered    sulfamethoxazole-trimethoprim (BACTRIM DS) 800-160 MG tablet  2 times daily        07/16/23 0155    polyethylene glycol (MIRALAX) 17 g packet  Daily        07/16/23 0155             Discharge Instructions Discussed with and Provided to Patient:    Discharge Instructions      You were evaluated in the Emergency Department and after careful evaluation, we did not find any emergent condition requiring admission or further testing in the hospital.  Your exam/testing today is overall reassuring.  Bleeding seems to be coming from a UTI.  Take the Bactrim antibiotic as directed.  Use your home stool softeners as needed.  Can also use the MiraLAX provided.  Please return to the Emergency Department if you experience any worsening of your condition.   Thank you for allowing [place] to be a part of your care.      Sabas Sous, MD 07/16/23 [phone]

## 2023-07-16 NOTE — ED Notes (Signed)
In to room to find Chanin Maynard,RN covering heel wound to right foot with wet to dry dressing. Chanin stated Dr.Bero had disimpacted patient and Lenetta Quaker had performed an in -and-out cath.

## 2023-07-16 NOTE — ED Notes (Signed)
In and out completed on pt with 8 Fr straight tip catheter.  Chanin, RN and [place], [facility] were my catheter buddies,

## 2023-07-16 NOTE — Discharge Instructions (Signed)
You were evaluated in the Emergency Department and after careful evaluation, we did not find any emergent condition requiring admission or further testing in the hospital.  Your exam/testing today is overall reassuring.  Bleeding seems to be coming from a UTI.  Take the Bactrim antibiotic as directed.  Use your home stool softeners as needed.  Can also use the MiraLAX provided.  Please return to the Emergency Department if you experience any worsening of your condition.   Thank you for allowing [place] to be a part of your care.

## 2023-07-16 NOTE — ED Notes (Addendum)
Dr. Pilar Plate disimpacted approx10 small hard balls of stool. Pt tolerated well. Son states last bm was one week ago.  Incontinent care provided. Pt's son states she goes to the wound care center. Pt has a wound to her posterior midline buttocks area that is currently dressed and secured. Pt has a small right heel ulcer less than quarter size. Dressing was pulled off during disimpaction so a wet to dry dressing was replaced per pt's son request. Heel protectors placed back on both her left and right foot and positioned for comfort.

## 2023-07-17 DIAGNOSIS — N1832 Chronic kidney disease, stage 3b: Secondary | ICD-10-CM | POA: Diagnosis not present

## 2023-07-17 DIAGNOSIS — G9341 Metabolic encephalopathy: Secondary | ICD-10-CM | POA: Diagnosis not present

## 2023-07-17 DIAGNOSIS — I82411 Acute embolism and thrombosis of right femoral vein: Secondary | ICD-10-CM | POA: Diagnosis not present

## 2023-07-17 DIAGNOSIS — E1122 Type 2 diabetes mellitus with diabetic chronic kidney disease: Secondary | ICD-10-CM | POA: Diagnosis not present

## 2023-07-17 DIAGNOSIS — K5730 Diverticulosis of large intestine without perforation or abscess without bleeding: Secondary | ICD-10-CM | POA: Diagnosis not present

## 2023-07-17 DIAGNOSIS — L89613 Pressure ulcer of right heel, stage 3: Secondary | ICD-10-CM | POA: Diagnosis not present

## 2023-07-17 DIAGNOSIS — G309 Alzheimer's disease, unspecified: Secondary | ICD-10-CM | POA: Diagnosis not present

## 2023-07-17 DIAGNOSIS — L89324 Pressure ulcer of left buttock, stage 4: Secondary | ICD-10-CM | POA: Diagnosis not present

## 2023-07-17 DIAGNOSIS — I700 Atherosclerosis of aorta: Secondary | ICD-10-CM | POA: Diagnosis not present

## 2023-07-17 DIAGNOSIS — I129 Hypertensive chronic kidney disease with stage 1 through stage 4 chronic kidney disease, or unspecified chronic kidney disease: Secondary | ICD-10-CM | POA: Diagnosis not present

## 2023-07-17 DIAGNOSIS — I82451 Acute embolism and thrombosis of right peroneal vein: Secondary | ICD-10-CM | POA: Diagnosis not present

## 2023-07-20 DIAGNOSIS — I82411 Acute embolism and thrombosis of right femoral vein: Secondary | ICD-10-CM | POA: Diagnosis not present

## 2023-07-20 DIAGNOSIS — L89324 Pressure ulcer of left buttock, stage 4: Secondary | ICD-10-CM | POA: Diagnosis not present

## 2023-07-20 DIAGNOSIS — I82451 Acute embolism and thrombosis of right peroneal vein: Secondary | ICD-10-CM | POA: Diagnosis not present

## 2023-07-20 DIAGNOSIS — L89613 Pressure ulcer of right heel, stage 3: Secondary | ICD-10-CM | POA: Diagnosis not present

## 2023-07-20 DIAGNOSIS — G9341 Metabolic encephalopathy: Secondary | ICD-10-CM | POA: Diagnosis not present

## 2023-07-23 DIAGNOSIS — I82451 Acute embolism and thrombosis of right peroneal vein: Secondary | ICD-10-CM | POA: Diagnosis not present

## 2023-07-23 DIAGNOSIS — I82411 Acute embolism and thrombosis of right femoral vein: Secondary | ICD-10-CM | POA: Diagnosis not present

## 2023-07-23 DIAGNOSIS — L89613 Pressure ulcer of right heel, stage 3: Secondary | ICD-10-CM | POA: Diagnosis not present

## 2023-07-23 DIAGNOSIS — L89324 Pressure ulcer of left buttock, stage 4: Secondary | ICD-10-CM | POA: Diagnosis not present

## 2023-07-23 DIAGNOSIS — G9341 Metabolic encephalopathy: Secondary | ICD-10-CM | POA: Diagnosis not present

## 2023-07-27 ENCOUNTER — Encounter (HOSPITAL_BASED_OUTPATIENT_CLINIC_OR_DEPARTMENT_OTHER): Payer: Medicare Other | Admitting: Internal Medicine

## 2023-07-27 DIAGNOSIS — F0390 Unspecified dementia without behavioral disturbance: Secondary | ICD-10-CM

## 2023-07-27 DIAGNOSIS — L89154 Pressure ulcer of sacral region, stage 4: Secondary | ICD-10-CM

## 2023-07-27 DIAGNOSIS — L89610 Pressure ulcer of right heel, unstageable: Secondary | ICD-10-CM | POA: Diagnosis not present

## 2023-07-31 DIAGNOSIS — L89613 Pressure ulcer of right heel, stage 3: Secondary | ICD-10-CM | POA: Diagnosis not present

## 2023-07-31 DIAGNOSIS — G9341 Metabolic encephalopathy: Secondary | ICD-10-CM | POA: Diagnosis not present

## 2023-07-31 DIAGNOSIS — L89154 Pressure ulcer of sacral region, stage 4: Secondary | ICD-10-CM | POA: Diagnosis not present

## 2023-07-31 DIAGNOSIS — L89610 Pressure ulcer of right heel, unstageable: Secondary | ICD-10-CM | POA: Diagnosis not present

## 2023-07-31 DIAGNOSIS — I82411 Acute embolism and thrombosis of right femoral vein: Secondary | ICD-10-CM | POA: Diagnosis not present

## 2023-07-31 DIAGNOSIS — L89324 Pressure ulcer of left buttock, stage 4: Secondary | ICD-10-CM | POA: Diagnosis not present

## 2023-07-31 DIAGNOSIS — I82451 Acute embolism and thrombosis of right peroneal vein: Secondary | ICD-10-CM | POA: Diagnosis not present

## 2023-08-01 DIAGNOSIS — L89613 Pressure ulcer of right heel, stage 3: Secondary | ICD-10-CM | POA: Diagnosis not present

## 2023-08-01 DIAGNOSIS — R195 Other fecal abnormalities: Secondary | ICD-10-CM | POA: Diagnosis not present

## 2023-08-01 DIAGNOSIS — Z86718 Personal history of other venous thrombosis and embolism: Secondary | ICD-10-CM | POA: Diagnosis not present

## 2023-08-01 DIAGNOSIS — L89324 Pressure ulcer of left buttock, stage 4: Secondary | ICD-10-CM | POA: Diagnosis not present

## 2023-08-03 DIAGNOSIS — G9341 Metabolic encephalopathy: Secondary | ICD-10-CM | POA: Diagnosis not present

## 2023-08-03 DIAGNOSIS — L89613 Pressure ulcer of right heel, stage 3: Secondary | ICD-10-CM | POA: Diagnosis not present

## 2023-08-03 DIAGNOSIS — I82451 Acute embolism and thrombosis of right peroneal vein: Secondary | ICD-10-CM | POA: Diagnosis not present

## 2023-08-03 DIAGNOSIS — I82411 Acute embolism and thrombosis of right femoral vein: Secondary | ICD-10-CM | POA: Diagnosis not present

## 2023-08-03 DIAGNOSIS — L89324 Pressure ulcer of left buttock, stage 4: Secondary | ICD-10-CM | POA: Diagnosis not present

## 2023-08-06 ENCOUNTER — Ambulatory Visit: Payer: Medicare Other | Admitting: Podiatry

## 2023-08-06 DIAGNOSIS — I82451 Acute embolism and thrombosis of right peroneal vein: Secondary | ICD-10-CM | POA: Diagnosis not present

## 2023-08-06 DIAGNOSIS — L89613 Pressure ulcer of right heel, stage 3: Secondary | ICD-10-CM | POA: Diagnosis not present

## 2023-08-06 DIAGNOSIS — I82411 Acute embolism and thrombosis of right femoral vein: Secondary | ICD-10-CM | POA: Diagnosis not present

## 2023-08-06 DIAGNOSIS — G9341 Metabolic encephalopathy: Secondary | ICD-10-CM | POA: Diagnosis not present

## 2023-08-06 DIAGNOSIS — L89324 Pressure ulcer of left buttock, stage 4: Secondary | ICD-10-CM | POA: Diagnosis not present

## 2023-08-09 DIAGNOSIS — I82451 Acute embolism and thrombosis of right peroneal vein: Secondary | ICD-10-CM | POA: Diagnosis not present

## 2023-08-09 DIAGNOSIS — L89324 Pressure ulcer of left buttock, stage 4: Secondary | ICD-10-CM | POA: Diagnosis not present

## 2023-08-09 DIAGNOSIS — G9341 Metabolic encephalopathy: Secondary | ICD-10-CM | POA: Diagnosis not present

## 2023-08-09 DIAGNOSIS — L89613 Pressure ulcer of right heel, stage 3: Secondary | ICD-10-CM | POA: Diagnosis not present

## 2023-08-09 DIAGNOSIS — I82411 Acute embolism and thrombosis of right femoral vein: Secondary | ICD-10-CM | POA: Diagnosis not present

## 2023-08-10 ENCOUNTER — Encounter (HOSPITAL_BASED_OUTPATIENT_CLINIC_OR_DEPARTMENT_OTHER): Attending: Internal Medicine | Admitting: Internal Medicine

## 2023-08-10 ENCOUNTER — Encounter (HOSPITAL_BASED_OUTPATIENT_CLINIC_OR_DEPARTMENT_OTHER): Payer: Medicare Other | Admitting: Internal Medicine

## 2023-08-10 DIAGNOSIS — F0390 Unspecified dementia without behavioral disturbance: Secondary | ICD-10-CM | POA: Insufficient documentation

## 2023-08-10 DIAGNOSIS — L89610 Pressure ulcer of right heel, unstageable: Secondary | ICD-10-CM | POA: Diagnosis not present

## 2023-08-10 DIAGNOSIS — L89154 Pressure ulcer of sacral region, stage 4: Secondary | ICD-10-CM | POA: Insufficient documentation

## 2023-08-13 DIAGNOSIS — I82451 Acute embolism and thrombosis of right peroneal vein: Secondary | ICD-10-CM | POA: Diagnosis not present

## 2023-08-13 DIAGNOSIS — I82411 Acute embolism and thrombosis of right femoral vein: Secondary | ICD-10-CM | POA: Diagnosis not present

## 2023-08-13 DIAGNOSIS — L89324 Pressure ulcer of left buttock, stage 4: Secondary | ICD-10-CM | POA: Diagnosis not present

## 2023-08-13 DIAGNOSIS — G9341 Metabolic encephalopathy: Secondary | ICD-10-CM | POA: Diagnosis not present

## 2023-08-13 DIAGNOSIS — L89613 Pressure ulcer of right heel, stage 3: Secondary | ICD-10-CM | POA: Diagnosis not present

## 2023-08-17 DIAGNOSIS — G9341 Metabolic encephalopathy: Secondary | ICD-10-CM | POA: Diagnosis not present

## 2023-08-17 DIAGNOSIS — L89324 Pressure ulcer of left buttock, stage 4: Secondary | ICD-10-CM | POA: Diagnosis not present

## 2023-08-17 DIAGNOSIS — I82411 Acute embolism and thrombosis of right femoral vein: Secondary | ICD-10-CM | POA: Diagnosis not present

## 2023-08-17 DIAGNOSIS — I82451 Acute embolism and thrombosis of right peroneal vein: Secondary | ICD-10-CM | POA: Diagnosis not present

## 2023-08-17 DIAGNOSIS — L89613 Pressure ulcer of right heel, stage 3: Secondary | ICD-10-CM | POA: Diagnosis not present

## 2023-08-20 DIAGNOSIS — I82411 Acute embolism and thrombosis of right femoral vein: Secondary | ICD-10-CM | POA: Diagnosis not present

## 2023-08-20 DIAGNOSIS — L89613 Pressure ulcer of right heel, stage 3: Secondary | ICD-10-CM | POA: Diagnosis not present

## 2023-08-20 DIAGNOSIS — I82451 Acute embolism and thrombosis of right peroneal vein: Secondary | ICD-10-CM | POA: Diagnosis not present

## 2023-08-20 DIAGNOSIS — L89324 Pressure ulcer of left buttock, stage 4: Secondary | ICD-10-CM | POA: Diagnosis not present

## 2023-08-20 DIAGNOSIS — G9341 Metabolic encephalopathy: Secondary | ICD-10-CM | POA: Diagnosis not present

## 2023-08-23 DIAGNOSIS — G9341 Metabolic encephalopathy: Secondary | ICD-10-CM | POA: Diagnosis not present

## 2023-08-23 DIAGNOSIS — I82451 Acute embolism and thrombosis of right peroneal vein: Secondary | ICD-10-CM | POA: Diagnosis not present

## 2023-08-23 DIAGNOSIS — L89324 Pressure ulcer of left buttock, stage 4: Secondary | ICD-10-CM | POA: Diagnosis not present

## 2023-08-23 DIAGNOSIS — I82411 Acute embolism and thrombosis of right femoral vein: Secondary | ICD-10-CM | POA: Diagnosis not present

## 2023-08-23 DIAGNOSIS — L89613 Pressure ulcer of right heel, stage 3: Secondary | ICD-10-CM | POA: Diagnosis not present

## 2023-08-24 ENCOUNTER — Encounter (HOSPITAL_BASED_OUTPATIENT_CLINIC_OR_DEPARTMENT_OTHER): Admitting: Internal Medicine

## 2023-08-24 DIAGNOSIS — L89154 Pressure ulcer of sacral region, stage 4: Secondary | ICD-10-CM

## 2023-08-24 DIAGNOSIS — L89610 Pressure ulcer of right heel, unstageable: Secondary | ICD-10-CM | POA: Diagnosis not present

## 2023-08-24 DIAGNOSIS — F0390 Unspecified dementia without behavioral disturbance: Secondary | ICD-10-CM

## 2023-08-28 DIAGNOSIS — L89324 Pressure ulcer of left buttock, stage 4: Secondary | ICD-10-CM | POA: Diagnosis not present

## 2023-08-28 DIAGNOSIS — L89613 Pressure ulcer of right heel, stage 3: Secondary | ICD-10-CM | POA: Diagnosis not present

## 2023-08-28 DIAGNOSIS — G9341 Metabolic encephalopathy: Secondary | ICD-10-CM | POA: Diagnosis not present

## 2023-08-28 DIAGNOSIS — I82451 Acute embolism and thrombosis of right peroneal vein: Secondary | ICD-10-CM | POA: Diagnosis not present

## 2023-08-28 DIAGNOSIS — I82411 Acute embolism and thrombosis of right femoral vein: Secondary | ICD-10-CM | POA: Diagnosis not present

## 2023-08-29 ENCOUNTER — Other Ambulatory Visit (HOSPITAL_BASED_OUTPATIENT_CLINIC_OR_DEPARTMENT_OTHER): Payer: Self-pay | Admitting: Internal Medicine

## 2023-08-29 ENCOUNTER — Ambulatory Visit (HOSPITAL_BASED_OUTPATIENT_CLINIC_OR_DEPARTMENT_OTHER)
Admission: RE | Admit: 2023-08-29 | Discharge: 2023-08-29 | Disposition: A | Discharge status: Home / Self Care | Source: Ambulatory Visit | Attending: Internal Medicine | Admitting: Internal Medicine

## 2023-08-29 DIAGNOSIS — L89154 Pressure ulcer of sacral region, stage 4: Secondary | ICD-10-CM

## 2023-08-29 DIAGNOSIS — L89219 Pressure ulcer of right hip, unspecified stage: Secondary | ICD-10-CM | POA: Diagnosis not present

## 2023-08-31 DIAGNOSIS — L89154 Pressure ulcer of sacral region, stage 4: Secondary | ICD-10-CM | POA: Diagnosis not present

## 2023-08-31 DIAGNOSIS — G9341 Metabolic encephalopathy: Secondary | ICD-10-CM | POA: Diagnosis not present

## 2023-08-31 DIAGNOSIS — I82411 Acute embolism and thrombosis of right femoral vein: Secondary | ICD-10-CM | POA: Diagnosis not present

## 2023-08-31 DIAGNOSIS — L89613 Pressure ulcer of right heel, stage 3: Secondary | ICD-10-CM | POA: Diagnosis not present

## 2023-08-31 DIAGNOSIS — L89610 Pressure ulcer of right heel, unstageable: Secondary | ICD-10-CM | POA: Diagnosis not present

## 2023-08-31 DIAGNOSIS — I82451 Acute embolism and thrombosis of right peroneal vein: Secondary | ICD-10-CM | POA: Diagnosis not present

## 2023-08-31 DIAGNOSIS — L89324 Pressure ulcer of left buttock, stage 4: Secondary | ICD-10-CM | POA: Diagnosis not present

## 2023-09-03 DIAGNOSIS — I82411 Acute embolism and thrombosis of right femoral vein: Secondary | ICD-10-CM | POA: Diagnosis not present

## 2023-09-03 DIAGNOSIS — L89324 Pressure ulcer of left buttock, stage 4: Secondary | ICD-10-CM | POA: Diagnosis not present

## 2023-09-03 DIAGNOSIS — L89613 Pressure ulcer of right heel, stage 3: Secondary | ICD-10-CM | POA: Diagnosis not present

## 2023-09-03 DIAGNOSIS — I82451 Acute embolism and thrombosis of right peroneal vein: Secondary | ICD-10-CM | POA: Diagnosis not present

## 2023-09-03 DIAGNOSIS — G9341 Metabolic encephalopathy: Secondary | ICD-10-CM | POA: Diagnosis not present

## 2023-09-06 DIAGNOSIS — I82411 Acute embolism and thrombosis of right femoral vein: Secondary | ICD-10-CM | POA: Diagnosis not present

## 2023-09-06 DIAGNOSIS — I82451 Acute embolism and thrombosis of right peroneal vein: Secondary | ICD-10-CM | POA: Diagnosis not present

## 2023-09-06 DIAGNOSIS — G9341 Metabolic encephalopathy: Secondary | ICD-10-CM | POA: Diagnosis not present

## 2023-09-06 DIAGNOSIS — L89324 Pressure ulcer of left buttock, stage 4: Secondary | ICD-10-CM | POA: Diagnosis not present

## 2023-09-06 DIAGNOSIS — L89613 Pressure ulcer of right heel, stage 3: Secondary | ICD-10-CM | POA: Diagnosis not present

## 2023-09-07 ENCOUNTER — Encounter (HOSPITAL_BASED_OUTPATIENT_CLINIC_OR_DEPARTMENT_OTHER): Attending: Internal Medicine | Admitting: Internal Medicine

## 2023-09-07 DIAGNOSIS — L89610 Pressure ulcer of right heel, unstageable: Secondary | ICD-10-CM | POA: Diagnosis not present

## 2023-09-07 DIAGNOSIS — L89154 Pressure ulcer of sacral region, stage 4: Secondary | ICD-10-CM | POA: Diagnosis not present

## 2023-09-07 DIAGNOSIS — F0390 Unspecified dementia without behavioral disturbance: Secondary | ICD-10-CM

## 2023-09-10 DIAGNOSIS — L89324 Pressure ulcer of left buttock, stage 4: Secondary | ICD-10-CM | POA: Diagnosis not present

## 2023-09-10 DIAGNOSIS — G9341 Metabolic encephalopathy: Secondary | ICD-10-CM | POA: Diagnosis not present

## 2023-09-10 DIAGNOSIS — L89613 Pressure ulcer of right heel, stage 3: Secondary | ICD-10-CM | POA: Diagnosis not present

## 2023-09-10 DIAGNOSIS — I82451 Acute embolism and thrombosis of right peroneal vein: Secondary | ICD-10-CM | POA: Diagnosis not present

## 2023-09-10 DIAGNOSIS — I82411 Acute embolism and thrombosis of right femoral vein: Secondary | ICD-10-CM | POA: Diagnosis not present

## 2023-09-14 DIAGNOSIS — L89613 Pressure ulcer of right heel, stage 3: Secondary | ICD-10-CM | POA: Diagnosis not present

## 2023-09-14 DIAGNOSIS — I82411 Acute embolism and thrombosis of right femoral vein: Secondary | ICD-10-CM | POA: Diagnosis not present

## 2023-09-14 DIAGNOSIS — I82451 Acute embolism and thrombosis of right peroneal vein: Secondary | ICD-10-CM | POA: Diagnosis not present

## 2023-09-14 DIAGNOSIS — G9341 Metabolic encephalopathy: Secondary | ICD-10-CM | POA: Diagnosis not present

## 2023-09-14 DIAGNOSIS — L89324 Pressure ulcer of left buttock, stage 4: Secondary | ICD-10-CM | POA: Diagnosis not present

## 2023-09-15 DIAGNOSIS — G309 Alzheimer's disease, unspecified: Secondary | ICD-10-CM | POA: Diagnosis not present

## 2023-09-15 DIAGNOSIS — I82411 Acute embolism and thrombosis of right femoral vein: Secondary | ICD-10-CM | POA: Diagnosis not present

## 2023-09-15 DIAGNOSIS — I129 Hypertensive chronic kidney disease with stage 1 through stage 4 chronic kidney disease, or unspecified chronic kidney disease: Secondary | ICD-10-CM | POA: Diagnosis not present

## 2023-09-15 DIAGNOSIS — I82451 Acute embolism and thrombosis of right peroneal vein: Secondary | ICD-10-CM | POA: Diagnosis not present

## 2023-09-15 DIAGNOSIS — I700 Atherosclerosis of aorta: Secondary | ICD-10-CM | POA: Diagnosis not present

## 2023-09-15 DIAGNOSIS — E1122 Type 2 diabetes mellitus with diabetic chronic kidney disease: Secondary | ICD-10-CM | POA: Diagnosis not present

## 2023-09-15 DIAGNOSIS — L89613 Pressure ulcer of right heel, stage 3: Secondary | ICD-10-CM | POA: Diagnosis not present

## 2023-09-15 DIAGNOSIS — N1832 Chronic kidney disease, stage 3b: Secondary | ICD-10-CM | POA: Diagnosis not present

## 2023-09-15 DIAGNOSIS — G9341 Metabolic encephalopathy: Secondary | ICD-10-CM | POA: Diagnosis not present

## 2023-09-15 DIAGNOSIS — K5730 Diverticulosis of large intestine without perforation or abscess without bleeding: Secondary | ICD-10-CM | POA: Diagnosis not present

## 2023-09-15 DIAGNOSIS — L89324 Pressure ulcer of left buttock, stage 4: Secondary | ICD-10-CM | POA: Diagnosis not present

## 2023-09-17 DIAGNOSIS — E785 Hyperlipidemia, unspecified: Secondary | ICD-10-CM | POA: Diagnosis not present

## 2023-09-17 DIAGNOSIS — I82451 Acute embolism and thrombosis of right peroneal vein: Secondary | ICD-10-CM | POA: Diagnosis not present

## 2023-09-17 DIAGNOSIS — G309 Alzheimer's disease, unspecified: Secondary | ICD-10-CM | POA: Diagnosis not present

## 2023-09-17 DIAGNOSIS — L89613 Pressure ulcer of right heel, stage 3: Secondary | ICD-10-CM | POA: Diagnosis not present

## 2023-09-17 DIAGNOSIS — Z955 Presence of coronary angioplasty implant and graft: Secondary | ICD-10-CM | POA: Diagnosis not present

## 2023-09-17 DIAGNOSIS — I2510 Atherosclerotic heart disease of native coronary artery without angina pectoris: Secondary | ICD-10-CM | POA: Diagnosis not present

## 2023-09-17 DIAGNOSIS — L89324 Pressure ulcer of left buttock, stage 4: Secondary | ICD-10-CM | POA: Diagnosis not present

## 2023-09-17 DIAGNOSIS — Z87440 Personal history of urinary (tract) infections: Secondary | ICD-10-CM | POA: Diagnosis not present

## 2023-09-17 DIAGNOSIS — H409 Unspecified glaucoma: Secondary | ICD-10-CM | POA: Diagnosis not present

## 2023-09-17 DIAGNOSIS — M810 Age-related osteoporosis without current pathological fracture: Secondary | ICD-10-CM | POA: Diagnosis not present

## 2023-09-17 DIAGNOSIS — I700 Atherosclerosis of aorta: Secondary | ICD-10-CM | POA: Diagnosis not present

## 2023-09-17 DIAGNOSIS — Z85828 Personal history of other malignant neoplasm of skin: Secondary | ICD-10-CM | POA: Diagnosis not present

## 2023-09-17 DIAGNOSIS — K5730 Diverticulosis of large intestine without perforation or abscess without bleeding: Secondary | ICD-10-CM | POA: Diagnosis not present

## 2023-09-17 DIAGNOSIS — I129 Hypertensive chronic kidney disease with stage 1 through stage 4 chronic kidney disease, or unspecified chronic kidney disease: Secondary | ICD-10-CM | POA: Diagnosis not present

## 2023-09-17 DIAGNOSIS — E43 Unspecified severe protein-calorie malnutrition: Secondary | ICD-10-CM | POA: Diagnosis not present

## 2023-09-17 DIAGNOSIS — Z7901 Long term (current) use of anticoagulants: Secondary | ICD-10-CM | POA: Diagnosis not present

## 2023-09-17 DIAGNOSIS — N1832 Chronic kidney disease, stage 3b: Secondary | ICD-10-CM | POA: Diagnosis not present

## 2023-09-17 DIAGNOSIS — G9341 Metabolic encephalopathy: Secondary | ICD-10-CM | POA: Diagnosis not present

## 2023-09-17 DIAGNOSIS — E1122 Type 2 diabetes mellitus with diabetic chronic kidney disease: Secondary | ICD-10-CM | POA: Diagnosis not present

## 2023-09-17 DIAGNOSIS — Z7982 Long term (current) use of aspirin: Secondary | ICD-10-CM | POA: Diagnosis not present

## 2023-09-17 DIAGNOSIS — I82411 Acute embolism and thrombosis of right femoral vein: Secondary | ICD-10-CM | POA: Diagnosis not present

## 2023-09-17 DIAGNOSIS — Z9181 History of falling: Secondary | ICD-10-CM | POA: Diagnosis not present

## 2023-09-20 ENCOUNTER — Encounter (HOSPITAL_BASED_OUTPATIENT_CLINIC_OR_DEPARTMENT_OTHER): Admitting: Internal Medicine

## 2023-09-20 DIAGNOSIS — L89610 Pressure ulcer of right heel, unstageable: Secondary | ICD-10-CM

## 2023-09-20 DIAGNOSIS — F0390 Unspecified dementia without behavioral disturbance: Secondary | ICD-10-CM | POA: Diagnosis not present

## 2023-09-20 DIAGNOSIS — L89154 Pressure ulcer of sacral region, stage 4: Secondary | ICD-10-CM

## 2023-09-21 DIAGNOSIS — G309 Alzheimer's disease, unspecified: Secondary | ICD-10-CM | POA: Diagnosis not present

## 2023-09-21 DIAGNOSIS — L89324 Pressure ulcer of left buttock, stage 4: Secondary | ICD-10-CM | POA: Diagnosis not present

## 2023-09-21 DIAGNOSIS — K5730 Diverticulosis of large intestine without perforation or abscess without bleeding: Secondary | ICD-10-CM | POA: Diagnosis not present

## 2023-09-21 DIAGNOSIS — Z9181 History of falling: Secondary | ICD-10-CM | POA: Diagnosis not present

## 2023-09-21 DIAGNOSIS — I2510 Atherosclerotic heart disease of native coronary artery without angina pectoris: Secondary | ICD-10-CM | POA: Diagnosis not present

## 2023-09-21 DIAGNOSIS — N1832 Chronic kidney disease, stage 3b: Secondary | ICD-10-CM | POA: Diagnosis not present

## 2023-09-21 DIAGNOSIS — Z955 Presence of coronary angioplasty implant and graft: Secondary | ICD-10-CM | POA: Diagnosis not present

## 2023-09-21 DIAGNOSIS — I129 Hypertensive chronic kidney disease with stage 1 through stage 4 chronic kidney disease, or unspecified chronic kidney disease: Secondary | ICD-10-CM | POA: Diagnosis not present

## 2023-09-21 DIAGNOSIS — G9341 Metabolic encephalopathy: Secondary | ICD-10-CM | POA: Diagnosis not present

## 2023-09-21 DIAGNOSIS — Z87440 Personal history of urinary (tract) infections: Secondary | ICD-10-CM | POA: Diagnosis not present

## 2023-09-21 DIAGNOSIS — Z85828 Personal history of other malignant neoplasm of skin: Secondary | ICD-10-CM | POA: Diagnosis not present

## 2023-09-21 DIAGNOSIS — E1122 Type 2 diabetes mellitus with diabetic chronic kidney disease: Secondary | ICD-10-CM | POA: Diagnosis not present

## 2023-09-21 DIAGNOSIS — Z7982 Long term (current) use of aspirin: Secondary | ICD-10-CM | POA: Diagnosis not present

## 2023-09-21 DIAGNOSIS — I700 Atherosclerosis of aorta: Secondary | ICD-10-CM | POA: Diagnosis not present

## 2023-09-21 DIAGNOSIS — L89613 Pressure ulcer of right heel, stage 3: Secondary | ICD-10-CM | POA: Diagnosis not present

## 2023-09-21 DIAGNOSIS — I82411 Acute embolism and thrombosis of right femoral vein: Secondary | ICD-10-CM | POA: Diagnosis not present

## 2023-09-21 DIAGNOSIS — E785 Hyperlipidemia, unspecified: Secondary | ICD-10-CM | POA: Diagnosis not present

## 2023-09-21 DIAGNOSIS — E43 Unspecified severe protein-calorie malnutrition: Secondary | ICD-10-CM | POA: Diagnosis not present

## 2023-09-21 DIAGNOSIS — Z7901 Long term (current) use of anticoagulants: Secondary | ICD-10-CM | POA: Diagnosis not present

## 2023-09-21 DIAGNOSIS — M810 Age-related osteoporosis without current pathological fracture: Secondary | ICD-10-CM | POA: Diagnosis not present

## 2023-09-21 DIAGNOSIS — I82451 Acute embolism and thrombosis of right peroneal vein: Secondary | ICD-10-CM | POA: Diagnosis not present

## 2023-09-21 DIAGNOSIS — H409 Unspecified glaucoma: Secondary | ICD-10-CM | POA: Diagnosis not present

## 2023-09-25 DIAGNOSIS — I82451 Acute embolism and thrombosis of right peroneal vein: Secondary | ICD-10-CM | POA: Diagnosis not present

## 2023-09-25 DIAGNOSIS — L89613 Pressure ulcer of right heel, stage 3: Secondary | ICD-10-CM | POA: Diagnosis not present

## 2023-09-25 DIAGNOSIS — I2510 Atherosclerotic heart disease of native coronary artery without angina pectoris: Secondary | ICD-10-CM | POA: Diagnosis not present

## 2023-09-25 DIAGNOSIS — E43 Unspecified severe protein-calorie malnutrition: Secondary | ICD-10-CM | POA: Diagnosis not present

## 2023-09-25 DIAGNOSIS — G9341 Metabolic encephalopathy: Secondary | ICD-10-CM | POA: Diagnosis not present

## 2023-09-25 DIAGNOSIS — Z87440 Personal history of urinary (tract) infections: Secondary | ICD-10-CM | POA: Diagnosis not present

## 2023-09-25 DIAGNOSIS — Z955 Presence of coronary angioplasty implant and graft: Secondary | ICD-10-CM | POA: Diagnosis not present

## 2023-09-25 DIAGNOSIS — E785 Hyperlipidemia, unspecified: Secondary | ICD-10-CM | POA: Diagnosis not present

## 2023-09-25 DIAGNOSIS — I700 Atherosclerosis of aorta: Secondary | ICD-10-CM | POA: Diagnosis not present

## 2023-09-25 DIAGNOSIS — Z7982 Long term (current) use of aspirin: Secondary | ICD-10-CM | POA: Diagnosis not present

## 2023-09-25 DIAGNOSIS — G309 Alzheimer's disease, unspecified: Secondary | ICD-10-CM | POA: Diagnosis not present

## 2023-09-25 DIAGNOSIS — Z85828 Personal history of other malignant neoplasm of skin: Secondary | ICD-10-CM | POA: Diagnosis not present

## 2023-09-25 DIAGNOSIS — L89324 Pressure ulcer of left buttock, stage 4: Secondary | ICD-10-CM | POA: Diagnosis not present

## 2023-09-25 DIAGNOSIS — M810 Age-related osteoporosis without current pathological fracture: Secondary | ICD-10-CM | POA: Diagnosis not present

## 2023-09-25 DIAGNOSIS — Z9181 History of falling: Secondary | ICD-10-CM | POA: Diagnosis not present

## 2023-09-25 DIAGNOSIS — E1122 Type 2 diabetes mellitus with diabetic chronic kidney disease: Secondary | ICD-10-CM | POA: Diagnosis not present

## 2023-09-25 DIAGNOSIS — K5730 Diverticulosis of large intestine without perforation or abscess without bleeding: Secondary | ICD-10-CM | POA: Diagnosis not present

## 2023-09-25 DIAGNOSIS — I129 Hypertensive chronic kidney disease with stage 1 through stage 4 chronic kidney disease, or unspecified chronic kidney disease: Secondary | ICD-10-CM | POA: Diagnosis not present

## 2023-09-25 DIAGNOSIS — N1832 Chronic kidney disease, stage 3b: Secondary | ICD-10-CM | POA: Diagnosis not present

## 2023-09-25 DIAGNOSIS — H409 Unspecified glaucoma: Secondary | ICD-10-CM | POA: Diagnosis not present

## 2023-09-25 DIAGNOSIS — Z7901 Long term (current) use of anticoagulants: Secondary | ICD-10-CM | POA: Diagnosis not present

## 2023-09-25 DIAGNOSIS — I82411 Acute embolism and thrombosis of right femoral vein: Secondary | ICD-10-CM | POA: Diagnosis not present

## 2023-09-28 DIAGNOSIS — I82451 Acute embolism and thrombosis of right peroneal vein: Secondary | ICD-10-CM | POA: Diagnosis not present

## 2023-09-28 DIAGNOSIS — N1832 Chronic kidney disease, stage 3b: Secondary | ICD-10-CM | POA: Diagnosis not present

## 2023-09-28 DIAGNOSIS — Z955 Presence of coronary angioplasty implant and graft: Secondary | ICD-10-CM | POA: Diagnosis not present

## 2023-09-28 DIAGNOSIS — I129 Hypertensive chronic kidney disease with stage 1 through stage 4 chronic kidney disease, or unspecified chronic kidney disease: Secondary | ICD-10-CM | POA: Diagnosis not present

## 2023-09-28 DIAGNOSIS — E1122 Type 2 diabetes mellitus with diabetic chronic kidney disease: Secondary | ICD-10-CM | POA: Diagnosis not present

## 2023-09-28 DIAGNOSIS — Z7982 Long term (current) use of aspirin: Secondary | ICD-10-CM | POA: Diagnosis not present

## 2023-09-28 DIAGNOSIS — Z85828 Personal history of other malignant neoplasm of skin: Secondary | ICD-10-CM | POA: Diagnosis not present

## 2023-09-28 DIAGNOSIS — K5730 Diverticulosis of large intestine without perforation or abscess without bleeding: Secondary | ICD-10-CM | POA: Diagnosis not present

## 2023-09-28 DIAGNOSIS — I82411 Acute embolism and thrombosis of right femoral vein: Secondary | ICD-10-CM | POA: Diagnosis not present

## 2023-09-28 DIAGNOSIS — L89613 Pressure ulcer of right heel, stage 3: Secondary | ICD-10-CM | POA: Diagnosis not present

## 2023-09-28 DIAGNOSIS — I2510 Atherosclerotic heart disease of native coronary artery without angina pectoris: Secondary | ICD-10-CM | POA: Diagnosis not present

## 2023-09-28 DIAGNOSIS — Z7901 Long term (current) use of anticoagulants: Secondary | ICD-10-CM | POA: Diagnosis not present

## 2023-09-28 DIAGNOSIS — Z9181 History of falling: Secondary | ICD-10-CM | POA: Diagnosis not present

## 2023-09-28 DIAGNOSIS — E785 Hyperlipidemia, unspecified: Secondary | ICD-10-CM | POA: Diagnosis not present

## 2023-09-28 DIAGNOSIS — L89324 Pressure ulcer of left buttock, stage 4: Secondary | ICD-10-CM | POA: Diagnosis not present

## 2023-09-28 DIAGNOSIS — H409 Unspecified glaucoma: Secondary | ICD-10-CM | POA: Diagnosis not present

## 2023-09-28 DIAGNOSIS — E43 Unspecified severe protein-calorie malnutrition: Secondary | ICD-10-CM | POA: Diagnosis not present

## 2023-09-28 DIAGNOSIS — I700 Atherosclerosis of aorta: Secondary | ICD-10-CM | POA: Diagnosis not present

## 2023-09-28 DIAGNOSIS — G309 Alzheimer's disease, unspecified: Secondary | ICD-10-CM | POA: Diagnosis not present

## 2023-09-28 DIAGNOSIS — G9341 Metabolic encephalopathy: Secondary | ICD-10-CM | POA: Diagnosis not present

## 2023-09-28 DIAGNOSIS — Z87440 Personal history of urinary (tract) infections: Secondary | ICD-10-CM | POA: Diagnosis not present

## 2023-09-28 DIAGNOSIS — M810 Age-related osteoporosis without current pathological fracture: Secondary | ICD-10-CM | POA: Diagnosis not present

## 2023-09-30 DIAGNOSIS — L89610 Pressure ulcer of right heel, unstageable: Secondary | ICD-10-CM | POA: Diagnosis not present

## 2023-09-30 DIAGNOSIS — L89154 Pressure ulcer of sacral region, stage 4: Secondary | ICD-10-CM | POA: Diagnosis not present

## 2023-10-02 DIAGNOSIS — E785 Hyperlipidemia, unspecified: Secondary | ICD-10-CM | POA: Diagnosis not present

## 2023-10-02 DIAGNOSIS — I82451 Acute embolism and thrombosis of right peroneal vein: Secondary | ICD-10-CM | POA: Diagnosis not present

## 2023-10-02 DIAGNOSIS — I2510 Atherosclerotic heart disease of native coronary artery without angina pectoris: Secondary | ICD-10-CM | POA: Diagnosis not present

## 2023-10-02 DIAGNOSIS — L89324 Pressure ulcer of left buttock, stage 4: Secondary | ICD-10-CM | POA: Diagnosis not present

## 2023-10-02 DIAGNOSIS — H409 Unspecified glaucoma: Secondary | ICD-10-CM | POA: Diagnosis not present

## 2023-10-02 DIAGNOSIS — I82411 Acute embolism and thrombosis of right femoral vein: Secondary | ICD-10-CM | POA: Diagnosis not present

## 2023-10-02 DIAGNOSIS — Z9181 History of falling: Secondary | ICD-10-CM | POA: Diagnosis not present

## 2023-10-02 DIAGNOSIS — K5730 Diverticulosis of large intestine without perforation or abscess without bleeding: Secondary | ICD-10-CM | POA: Diagnosis not present

## 2023-10-02 DIAGNOSIS — M810 Age-related osteoporosis without current pathological fracture: Secondary | ICD-10-CM | POA: Diagnosis not present

## 2023-10-02 DIAGNOSIS — Z955 Presence of coronary angioplasty implant and graft: Secondary | ICD-10-CM | POA: Diagnosis not present

## 2023-10-02 DIAGNOSIS — Z7982 Long term (current) use of aspirin: Secondary | ICD-10-CM | POA: Diagnosis not present

## 2023-10-02 DIAGNOSIS — Z85828 Personal history of other malignant neoplasm of skin: Secondary | ICD-10-CM | POA: Diagnosis not present

## 2023-10-02 DIAGNOSIS — I700 Atherosclerosis of aorta: Secondary | ICD-10-CM | POA: Diagnosis not present

## 2023-10-02 DIAGNOSIS — G9341 Metabolic encephalopathy: Secondary | ICD-10-CM | POA: Diagnosis not present

## 2023-10-02 DIAGNOSIS — Z7901 Long term (current) use of anticoagulants: Secondary | ICD-10-CM | POA: Diagnosis not present

## 2023-10-02 DIAGNOSIS — I129 Hypertensive chronic kidney disease with stage 1 through stage 4 chronic kidney disease, or unspecified chronic kidney disease: Secondary | ICD-10-CM | POA: Diagnosis not present

## 2023-10-02 DIAGNOSIS — G309 Alzheimer's disease, unspecified: Secondary | ICD-10-CM | POA: Diagnosis not present

## 2023-10-02 DIAGNOSIS — E43 Unspecified severe protein-calorie malnutrition: Secondary | ICD-10-CM | POA: Diagnosis not present

## 2023-10-02 DIAGNOSIS — E1122 Type 2 diabetes mellitus with diabetic chronic kidney disease: Secondary | ICD-10-CM | POA: Diagnosis not present

## 2023-10-02 DIAGNOSIS — N1832 Chronic kidney disease, stage 3b: Secondary | ICD-10-CM | POA: Diagnosis not present

## 2023-10-02 DIAGNOSIS — Z87440 Personal history of urinary (tract) infections: Secondary | ICD-10-CM | POA: Diagnosis not present

## 2023-10-02 DIAGNOSIS — L89613 Pressure ulcer of right heel, stage 3: Secondary | ICD-10-CM | POA: Diagnosis not present

## 2023-10-04 DIAGNOSIS — Z7901 Long term (current) use of anticoagulants: Secondary | ICD-10-CM | POA: Diagnosis not present

## 2023-10-04 DIAGNOSIS — G309 Alzheimer's disease, unspecified: Secondary | ICD-10-CM | POA: Diagnosis not present

## 2023-10-04 DIAGNOSIS — I700 Atherosclerosis of aorta: Secondary | ICD-10-CM | POA: Diagnosis not present

## 2023-10-04 DIAGNOSIS — Z85828 Personal history of other malignant neoplasm of skin: Secondary | ICD-10-CM | POA: Diagnosis not present

## 2023-10-04 DIAGNOSIS — Z7982 Long term (current) use of aspirin: Secondary | ICD-10-CM | POA: Diagnosis not present

## 2023-10-04 DIAGNOSIS — Z9181 History of falling: Secondary | ICD-10-CM | POA: Diagnosis not present

## 2023-10-04 DIAGNOSIS — K5730 Diverticulosis of large intestine without perforation or abscess without bleeding: Secondary | ICD-10-CM | POA: Diagnosis not present

## 2023-10-04 DIAGNOSIS — N1832 Chronic kidney disease, stage 3b: Secondary | ICD-10-CM | POA: Diagnosis not present

## 2023-10-04 DIAGNOSIS — G9341 Metabolic encephalopathy: Secondary | ICD-10-CM | POA: Diagnosis not present

## 2023-10-04 DIAGNOSIS — I82451 Acute embolism and thrombosis of right peroneal vein: Secondary | ICD-10-CM | POA: Diagnosis not present

## 2023-10-04 DIAGNOSIS — L89613 Pressure ulcer of right heel, stage 3: Secondary | ICD-10-CM | POA: Diagnosis not present

## 2023-10-04 DIAGNOSIS — Z87440 Personal history of urinary (tract) infections: Secondary | ICD-10-CM | POA: Diagnosis not present

## 2023-10-04 DIAGNOSIS — E785 Hyperlipidemia, unspecified: Secondary | ICD-10-CM | POA: Diagnosis not present

## 2023-10-04 DIAGNOSIS — I2510 Atherosclerotic heart disease of native coronary artery without angina pectoris: Secondary | ICD-10-CM | POA: Diagnosis not present

## 2023-10-04 DIAGNOSIS — L89324 Pressure ulcer of left buttock, stage 4: Secondary | ICD-10-CM | POA: Diagnosis not present

## 2023-10-04 DIAGNOSIS — I82411 Acute embolism and thrombosis of right femoral vein: Secondary | ICD-10-CM | POA: Diagnosis not present

## 2023-10-04 DIAGNOSIS — E43 Unspecified severe protein-calorie malnutrition: Secondary | ICD-10-CM | POA: Diagnosis not present

## 2023-10-04 DIAGNOSIS — E1122 Type 2 diabetes mellitus with diabetic chronic kidney disease: Secondary | ICD-10-CM | POA: Diagnosis not present

## 2023-10-04 DIAGNOSIS — M810 Age-related osteoporosis without current pathological fracture: Secondary | ICD-10-CM | POA: Diagnosis not present

## 2023-10-04 DIAGNOSIS — I129 Hypertensive chronic kidney disease with stage 1 through stage 4 chronic kidney disease, or unspecified chronic kidney disease: Secondary | ICD-10-CM | POA: Diagnosis not present

## 2023-10-04 DIAGNOSIS — H409 Unspecified glaucoma: Secondary | ICD-10-CM | POA: Diagnosis not present

## 2023-10-04 DIAGNOSIS — Z955 Presence of coronary angioplasty implant and graft: Secondary | ICD-10-CM | POA: Diagnosis not present

## 2023-10-05 ENCOUNTER — Encounter (HOSPITAL_BASED_OUTPATIENT_CLINIC_OR_DEPARTMENT_OTHER): Attending: Internal Medicine | Admitting: Internal Medicine

## 2023-10-05 DIAGNOSIS — F0390 Unspecified dementia without behavioral disturbance: Secondary | ICD-10-CM | POA: Diagnosis not present

## 2023-10-05 DIAGNOSIS — L89154 Pressure ulcer of sacral region, stage 4: Secondary | ICD-10-CM | POA: Insufficient documentation

## 2023-10-05 DIAGNOSIS — L89610 Pressure ulcer of right heel, unstageable: Secondary | ICD-10-CM | POA: Diagnosis not present

## 2023-10-05 DIAGNOSIS — L0390 Cellulitis, unspecified: Secondary | ICD-10-CM | POA: Diagnosis not present

## 2023-10-09 DIAGNOSIS — Z87440 Personal history of urinary (tract) infections: Secondary | ICD-10-CM | POA: Diagnosis not present

## 2023-10-09 DIAGNOSIS — G9341 Metabolic encephalopathy: Secondary | ICD-10-CM | POA: Diagnosis not present

## 2023-10-09 DIAGNOSIS — H409 Unspecified glaucoma: Secondary | ICD-10-CM | POA: Diagnosis not present

## 2023-10-09 DIAGNOSIS — G309 Alzheimer's disease, unspecified: Secondary | ICD-10-CM | POA: Diagnosis not present

## 2023-10-09 DIAGNOSIS — Z85828 Personal history of other malignant neoplasm of skin: Secondary | ICD-10-CM | POA: Diagnosis not present

## 2023-10-09 DIAGNOSIS — I82411 Acute embolism and thrombosis of right femoral vein: Secondary | ICD-10-CM | POA: Diagnosis not present

## 2023-10-09 DIAGNOSIS — Z7982 Long term (current) use of aspirin: Secondary | ICD-10-CM | POA: Diagnosis not present

## 2023-10-09 DIAGNOSIS — I700 Atherosclerosis of aorta: Secondary | ICD-10-CM | POA: Diagnosis not present

## 2023-10-09 DIAGNOSIS — E785 Hyperlipidemia, unspecified: Secondary | ICD-10-CM | POA: Diagnosis not present

## 2023-10-09 DIAGNOSIS — L89324 Pressure ulcer of left buttock, stage 4: Secondary | ICD-10-CM | POA: Diagnosis not present

## 2023-10-09 DIAGNOSIS — Z9181 History of falling: Secondary | ICD-10-CM | POA: Diagnosis not present

## 2023-10-09 DIAGNOSIS — E1122 Type 2 diabetes mellitus with diabetic chronic kidney disease: Secondary | ICD-10-CM | POA: Diagnosis not present

## 2023-10-09 DIAGNOSIS — L89613 Pressure ulcer of right heel, stage 3: Secondary | ICD-10-CM | POA: Diagnosis not present

## 2023-10-09 DIAGNOSIS — I82451 Acute embolism and thrombosis of right peroneal vein: Secondary | ICD-10-CM | POA: Diagnosis not present

## 2023-10-09 DIAGNOSIS — N1832 Chronic kidney disease, stage 3b: Secondary | ICD-10-CM | POA: Diagnosis not present

## 2023-10-09 DIAGNOSIS — M810 Age-related osteoporosis without current pathological fracture: Secondary | ICD-10-CM | POA: Diagnosis not present

## 2023-10-09 DIAGNOSIS — Z7901 Long term (current) use of anticoagulants: Secondary | ICD-10-CM | POA: Diagnosis not present

## 2023-10-09 DIAGNOSIS — I129 Hypertensive chronic kidney disease with stage 1 through stage 4 chronic kidney disease, or unspecified chronic kidney disease: Secondary | ICD-10-CM | POA: Diagnosis not present

## 2023-10-09 DIAGNOSIS — I2510 Atherosclerotic heart disease of native coronary artery without angina pectoris: Secondary | ICD-10-CM | POA: Diagnosis not present

## 2023-10-09 DIAGNOSIS — Z955 Presence of coronary angioplasty implant and graft: Secondary | ICD-10-CM | POA: Diagnosis not present

## 2023-10-09 DIAGNOSIS — E43 Unspecified severe protein-calorie malnutrition: Secondary | ICD-10-CM | POA: Diagnosis not present

## 2023-10-09 DIAGNOSIS — K5730 Diverticulosis of large intestine without perforation or abscess without bleeding: Secondary | ICD-10-CM | POA: Diagnosis not present

## 2023-10-12 DIAGNOSIS — I2510 Atherosclerotic heart disease of native coronary artery without angina pectoris: Secondary | ICD-10-CM | POA: Diagnosis not present

## 2023-10-12 DIAGNOSIS — I700 Atherosclerosis of aorta: Secondary | ICD-10-CM | POA: Diagnosis not present

## 2023-10-12 DIAGNOSIS — Z87440 Personal history of urinary (tract) infections: Secondary | ICD-10-CM | POA: Diagnosis not present

## 2023-10-12 DIAGNOSIS — I82411 Acute embolism and thrombosis of right femoral vein: Secondary | ICD-10-CM | POA: Diagnosis not present

## 2023-10-12 DIAGNOSIS — L89324 Pressure ulcer of left buttock, stage 4: Secondary | ICD-10-CM | POA: Diagnosis not present

## 2023-10-12 DIAGNOSIS — E43 Unspecified severe protein-calorie malnutrition: Secondary | ICD-10-CM | POA: Diagnosis not present

## 2023-10-12 DIAGNOSIS — I82451 Acute embolism and thrombosis of right peroneal vein: Secondary | ICD-10-CM | POA: Diagnosis not present

## 2023-10-12 DIAGNOSIS — Z7901 Long term (current) use of anticoagulants: Secondary | ICD-10-CM | POA: Diagnosis not present

## 2023-10-12 DIAGNOSIS — G9341 Metabolic encephalopathy: Secondary | ICD-10-CM | POA: Diagnosis not present

## 2023-10-12 DIAGNOSIS — E785 Hyperlipidemia, unspecified: Secondary | ICD-10-CM | POA: Diagnosis not present

## 2023-10-12 DIAGNOSIS — Z85828 Personal history of other malignant neoplasm of skin: Secondary | ICD-10-CM | POA: Diagnosis not present

## 2023-10-12 DIAGNOSIS — Z9181 History of falling: Secondary | ICD-10-CM | POA: Diagnosis not present

## 2023-10-12 DIAGNOSIS — I129 Hypertensive chronic kidney disease with stage 1 through stage 4 chronic kidney disease, or unspecified chronic kidney disease: Secondary | ICD-10-CM | POA: Diagnosis not present

## 2023-10-12 DIAGNOSIS — G309 Alzheimer's disease, unspecified: Secondary | ICD-10-CM | POA: Diagnosis not present

## 2023-10-12 DIAGNOSIS — K5730 Diverticulosis of large intestine without perforation or abscess without bleeding: Secondary | ICD-10-CM | POA: Diagnosis not present

## 2023-10-12 DIAGNOSIS — H409 Unspecified glaucoma: Secondary | ICD-10-CM | POA: Diagnosis not present

## 2023-10-12 DIAGNOSIS — L89613 Pressure ulcer of right heel, stage 3: Secondary | ICD-10-CM | POA: Diagnosis not present

## 2023-10-12 DIAGNOSIS — E1122 Type 2 diabetes mellitus with diabetic chronic kidney disease: Secondary | ICD-10-CM | POA: Diagnosis not present

## 2023-10-12 DIAGNOSIS — Z955 Presence of coronary angioplasty implant and graft: Secondary | ICD-10-CM | POA: Diagnosis not present

## 2023-10-12 DIAGNOSIS — Z7982 Long term (current) use of aspirin: Secondary | ICD-10-CM | POA: Diagnosis not present

## 2023-10-12 DIAGNOSIS — N1832 Chronic kidney disease, stage 3b: Secondary | ICD-10-CM | POA: Diagnosis not present

## 2023-10-12 DIAGNOSIS — M810 Age-related osteoporosis without current pathological fracture: Secondary | ICD-10-CM | POA: Diagnosis not present

## 2023-10-16 DIAGNOSIS — Z7901 Long term (current) use of anticoagulants: Secondary | ICD-10-CM | POA: Diagnosis not present

## 2023-10-16 DIAGNOSIS — K5730 Diverticulosis of large intestine without perforation or abscess without bleeding: Secondary | ICD-10-CM | POA: Diagnosis not present

## 2023-10-16 DIAGNOSIS — I129 Hypertensive chronic kidney disease with stage 1 through stage 4 chronic kidney disease, or unspecified chronic kidney disease: Secondary | ICD-10-CM | POA: Diagnosis not present

## 2023-10-16 DIAGNOSIS — M810 Age-related osteoporosis without current pathological fracture: Secondary | ICD-10-CM | POA: Diagnosis not present

## 2023-10-16 DIAGNOSIS — Z87440 Personal history of urinary (tract) infections: Secondary | ICD-10-CM | POA: Diagnosis not present

## 2023-10-16 DIAGNOSIS — E1122 Type 2 diabetes mellitus with diabetic chronic kidney disease: Secondary | ICD-10-CM | POA: Diagnosis not present

## 2023-10-16 DIAGNOSIS — E43 Unspecified severe protein-calorie malnutrition: Secondary | ICD-10-CM | POA: Diagnosis not present

## 2023-10-16 DIAGNOSIS — I2510 Atherosclerotic heart disease of native coronary artery without angina pectoris: Secondary | ICD-10-CM | POA: Diagnosis not present

## 2023-10-16 DIAGNOSIS — Z7982 Long term (current) use of aspirin: Secondary | ICD-10-CM | POA: Diagnosis not present

## 2023-10-16 DIAGNOSIS — Z85828 Personal history of other malignant neoplasm of skin: Secondary | ICD-10-CM | POA: Diagnosis not present

## 2023-10-16 DIAGNOSIS — N1832 Chronic kidney disease, stage 3b: Secondary | ICD-10-CM | POA: Diagnosis not present

## 2023-10-16 DIAGNOSIS — I82451 Acute embolism and thrombosis of right peroneal vein: Secondary | ICD-10-CM | POA: Diagnosis not present

## 2023-10-16 DIAGNOSIS — L89613 Pressure ulcer of right heel, stage 3: Secondary | ICD-10-CM | POA: Diagnosis not present

## 2023-10-16 DIAGNOSIS — G9341 Metabolic encephalopathy: Secondary | ICD-10-CM | POA: Diagnosis not present

## 2023-10-16 DIAGNOSIS — Z9181 History of falling: Secondary | ICD-10-CM | POA: Diagnosis not present

## 2023-10-16 DIAGNOSIS — I82411 Acute embolism and thrombosis of right femoral vein: Secondary | ICD-10-CM | POA: Diagnosis not present

## 2023-10-16 DIAGNOSIS — L89324 Pressure ulcer of left buttock, stage 4: Secondary | ICD-10-CM | POA: Diagnosis not present

## 2023-10-16 DIAGNOSIS — E785 Hyperlipidemia, unspecified: Secondary | ICD-10-CM | POA: Diagnosis not present

## 2023-10-16 DIAGNOSIS — H409 Unspecified glaucoma: Secondary | ICD-10-CM | POA: Diagnosis not present

## 2023-10-16 DIAGNOSIS — Z955 Presence of coronary angioplasty implant and graft: Secondary | ICD-10-CM | POA: Diagnosis not present

## 2023-10-16 DIAGNOSIS — I700 Atherosclerosis of aorta: Secondary | ICD-10-CM | POA: Diagnosis not present

## 2023-10-16 DIAGNOSIS — G309 Alzheimer's disease, unspecified: Secondary | ICD-10-CM | POA: Diagnosis not present

## 2023-10-18 DIAGNOSIS — I82451 Acute embolism and thrombosis of right peroneal vein: Secondary | ICD-10-CM | POA: Diagnosis not present

## 2023-10-18 DIAGNOSIS — E43 Unspecified severe protein-calorie malnutrition: Secondary | ICD-10-CM | POA: Diagnosis not present

## 2023-10-18 DIAGNOSIS — E1122 Type 2 diabetes mellitus with diabetic chronic kidney disease: Secondary | ICD-10-CM | POA: Diagnosis not present

## 2023-10-18 DIAGNOSIS — L89324 Pressure ulcer of left buttock, stage 4: Secondary | ICD-10-CM | POA: Diagnosis not present

## 2023-10-18 DIAGNOSIS — L89613 Pressure ulcer of right heel, stage 3: Secondary | ICD-10-CM | POA: Diagnosis not present

## 2023-10-18 DIAGNOSIS — Z955 Presence of coronary angioplasty implant and graft: Secondary | ICD-10-CM | POA: Diagnosis not present

## 2023-10-18 DIAGNOSIS — I82411 Acute embolism and thrombosis of right femoral vein: Secondary | ICD-10-CM | POA: Diagnosis not present

## 2023-10-18 DIAGNOSIS — Z85828 Personal history of other malignant neoplasm of skin: Secondary | ICD-10-CM | POA: Diagnosis not present

## 2023-10-18 DIAGNOSIS — Z7982 Long term (current) use of aspirin: Secondary | ICD-10-CM | POA: Diagnosis not present

## 2023-10-18 DIAGNOSIS — I129 Hypertensive chronic kidney disease with stage 1 through stage 4 chronic kidney disease, or unspecified chronic kidney disease: Secondary | ICD-10-CM | POA: Diagnosis not present

## 2023-10-18 DIAGNOSIS — Z87440 Personal history of urinary (tract) infections: Secondary | ICD-10-CM | POA: Diagnosis not present

## 2023-10-18 DIAGNOSIS — H409 Unspecified glaucoma: Secondary | ICD-10-CM | POA: Diagnosis not present

## 2023-10-18 DIAGNOSIS — K5730 Diverticulosis of large intestine without perforation or abscess without bleeding: Secondary | ICD-10-CM | POA: Diagnosis not present

## 2023-10-18 DIAGNOSIS — I700 Atherosclerosis of aorta: Secondary | ICD-10-CM | POA: Diagnosis not present

## 2023-10-18 DIAGNOSIS — M810 Age-related osteoporosis without current pathological fracture: Secondary | ICD-10-CM | POA: Diagnosis not present

## 2023-10-18 DIAGNOSIS — Z9181 History of falling: Secondary | ICD-10-CM | POA: Diagnosis not present

## 2023-10-18 DIAGNOSIS — G9341 Metabolic encephalopathy: Secondary | ICD-10-CM | POA: Diagnosis not present

## 2023-10-18 DIAGNOSIS — E785 Hyperlipidemia, unspecified: Secondary | ICD-10-CM | POA: Diagnosis not present

## 2023-10-18 DIAGNOSIS — N1832 Chronic kidney disease, stage 3b: Secondary | ICD-10-CM | POA: Diagnosis not present

## 2023-10-18 DIAGNOSIS — G309 Alzheimer's disease, unspecified: Secondary | ICD-10-CM | POA: Diagnosis not present

## 2023-10-18 DIAGNOSIS — Z7901 Long term (current) use of anticoagulants: Secondary | ICD-10-CM | POA: Diagnosis not present

## 2023-10-18 DIAGNOSIS — I2510 Atherosclerotic heart disease of native coronary artery without angina pectoris: Secondary | ICD-10-CM | POA: Diagnosis not present

## 2023-10-19 ENCOUNTER — Encounter (HOSPITAL_BASED_OUTPATIENT_CLINIC_OR_DEPARTMENT_OTHER): Admitting: Internal Medicine

## 2023-10-19 DIAGNOSIS — L89154 Pressure ulcer of sacral region, stage 4: Secondary | ICD-10-CM | POA: Diagnosis not present

## 2023-10-19 DIAGNOSIS — L89610 Pressure ulcer of right heel, unstageable: Secondary | ICD-10-CM | POA: Diagnosis not present

## 2023-10-19 DIAGNOSIS — L0390 Cellulitis, unspecified: Secondary | ICD-10-CM | POA: Diagnosis not present

## 2023-10-22 DIAGNOSIS — Z7901 Long term (current) use of anticoagulants: Secondary | ICD-10-CM | POA: Diagnosis not present

## 2023-10-22 DIAGNOSIS — I82451 Acute embolism and thrombosis of right peroneal vein: Secondary | ICD-10-CM | POA: Diagnosis not present

## 2023-10-22 DIAGNOSIS — I2510 Atherosclerotic heart disease of native coronary artery without angina pectoris: Secondary | ICD-10-CM | POA: Diagnosis not present

## 2023-10-22 DIAGNOSIS — M810 Age-related osteoporosis without current pathological fracture: Secondary | ICD-10-CM | POA: Diagnosis not present

## 2023-10-22 DIAGNOSIS — Z85828 Personal history of other malignant neoplasm of skin: Secondary | ICD-10-CM | POA: Diagnosis not present

## 2023-10-22 DIAGNOSIS — Z9181 History of falling: Secondary | ICD-10-CM | POA: Diagnosis not present

## 2023-10-22 DIAGNOSIS — E1122 Type 2 diabetes mellitus with diabetic chronic kidney disease: Secondary | ICD-10-CM | POA: Diagnosis not present

## 2023-10-22 DIAGNOSIS — H409 Unspecified glaucoma: Secondary | ICD-10-CM | POA: Diagnosis not present

## 2023-10-22 DIAGNOSIS — I700 Atherosclerosis of aorta: Secondary | ICD-10-CM | POA: Diagnosis not present

## 2023-10-22 DIAGNOSIS — N1832 Chronic kidney disease, stage 3b: Secondary | ICD-10-CM | POA: Diagnosis not present

## 2023-10-22 DIAGNOSIS — L89324 Pressure ulcer of left buttock, stage 4: Secondary | ICD-10-CM | POA: Diagnosis not present

## 2023-10-22 DIAGNOSIS — G309 Alzheimer's disease, unspecified: Secondary | ICD-10-CM | POA: Diagnosis not present

## 2023-10-22 DIAGNOSIS — I129 Hypertensive chronic kidney disease with stage 1 through stage 4 chronic kidney disease, or unspecified chronic kidney disease: Secondary | ICD-10-CM | POA: Diagnosis not present

## 2023-10-22 DIAGNOSIS — K5730 Diverticulosis of large intestine without perforation or abscess without bleeding: Secondary | ICD-10-CM | POA: Diagnosis not present

## 2023-10-22 DIAGNOSIS — E43 Unspecified severe protein-calorie malnutrition: Secondary | ICD-10-CM | POA: Diagnosis not present

## 2023-10-22 DIAGNOSIS — Z87440 Personal history of urinary (tract) infections: Secondary | ICD-10-CM | POA: Diagnosis not present

## 2023-10-22 DIAGNOSIS — Z955 Presence of coronary angioplasty implant and graft: Secondary | ICD-10-CM | POA: Diagnosis not present

## 2023-10-22 DIAGNOSIS — I82411 Acute embolism and thrombosis of right femoral vein: Secondary | ICD-10-CM | POA: Diagnosis not present

## 2023-10-22 DIAGNOSIS — Z7982 Long term (current) use of aspirin: Secondary | ICD-10-CM | POA: Diagnosis not present

## 2023-10-22 DIAGNOSIS — E785 Hyperlipidemia, unspecified: Secondary | ICD-10-CM | POA: Diagnosis not present

## 2023-10-22 DIAGNOSIS — L89613 Pressure ulcer of right heel, stage 3: Secondary | ICD-10-CM | POA: Diagnosis not present

## 2023-10-22 DIAGNOSIS — G9341 Metabolic encephalopathy: Secondary | ICD-10-CM | POA: Diagnosis not present

## 2023-10-25 DIAGNOSIS — G9341 Metabolic encephalopathy: Secondary | ICD-10-CM | POA: Diagnosis not present

## 2023-10-25 DIAGNOSIS — Z85828 Personal history of other malignant neoplasm of skin: Secondary | ICD-10-CM | POA: Diagnosis not present

## 2023-10-25 DIAGNOSIS — E1122 Type 2 diabetes mellitus with diabetic chronic kidney disease: Secondary | ICD-10-CM | POA: Diagnosis not present

## 2023-10-25 DIAGNOSIS — Z7982 Long term (current) use of aspirin: Secondary | ICD-10-CM | POA: Diagnosis not present

## 2023-10-25 DIAGNOSIS — Z9181 History of falling: Secondary | ICD-10-CM | POA: Diagnosis not present

## 2023-10-25 DIAGNOSIS — Z87440 Personal history of urinary (tract) infections: Secondary | ICD-10-CM | POA: Diagnosis not present

## 2023-10-25 DIAGNOSIS — E43 Unspecified severe protein-calorie malnutrition: Secondary | ICD-10-CM | POA: Diagnosis not present

## 2023-10-25 DIAGNOSIS — Z955 Presence of coronary angioplasty implant and graft: Secondary | ICD-10-CM | POA: Diagnosis not present

## 2023-10-25 DIAGNOSIS — E785 Hyperlipidemia, unspecified: Secondary | ICD-10-CM | POA: Diagnosis not present

## 2023-10-25 DIAGNOSIS — G309 Alzheimer's disease, unspecified: Secondary | ICD-10-CM | POA: Diagnosis not present

## 2023-10-25 DIAGNOSIS — I129 Hypertensive chronic kidney disease with stage 1 through stage 4 chronic kidney disease, or unspecified chronic kidney disease: Secondary | ICD-10-CM | POA: Diagnosis not present

## 2023-10-25 DIAGNOSIS — N1832 Chronic kidney disease, stage 3b: Secondary | ICD-10-CM | POA: Diagnosis not present

## 2023-10-25 DIAGNOSIS — I2510 Atherosclerotic heart disease of native coronary artery without angina pectoris: Secondary | ICD-10-CM | POA: Diagnosis not present

## 2023-10-25 DIAGNOSIS — I82411 Acute embolism and thrombosis of right femoral vein: Secondary | ICD-10-CM | POA: Diagnosis not present

## 2023-10-25 DIAGNOSIS — I700 Atherosclerosis of aorta: Secondary | ICD-10-CM | POA: Diagnosis not present

## 2023-10-25 DIAGNOSIS — H409 Unspecified glaucoma: Secondary | ICD-10-CM | POA: Diagnosis not present

## 2023-10-25 DIAGNOSIS — I82451 Acute embolism and thrombosis of right peroneal vein: Secondary | ICD-10-CM | POA: Diagnosis not present

## 2023-10-25 DIAGNOSIS — L89324 Pressure ulcer of left buttock, stage 4: Secondary | ICD-10-CM | POA: Diagnosis not present

## 2023-10-25 DIAGNOSIS — K5730 Diverticulosis of large intestine without perforation or abscess without bleeding: Secondary | ICD-10-CM | POA: Diagnosis not present

## 2023-10-25 DIAGNOSIS — L89613 Pressure ulcer of right heel, stage 3: Secondary | ICD-10-CM | POA: Diagnosis not present

## 2023-10-25 DIAGNOSIS — M810 Age-related osteoporosis without current pathological fracture: Secondary | ICD-10-CM | POA: Diagnosis not present

## 2023-10-25 DIAGNOSIS — Z7901 Long term (current) use of anticoagulants: Secondary | ICD-10-CM | POA: Diagnosis not present

## 2023-10-29 DIAGNOSIS — Z7982 Long term (current) use of aspirin: Secondary | ICD-10-CM | POA: Diagnosis not present

## 2023-10-29 DIAGNOSIS — E43 Unspecified severe protein-calorie malnutrition: Secondary | ICD-10-CM | POA: Diagnosis not present

## 2023-10-29 DIAGNOSIS — E785 Hyperlipidemia, unspecified: Secondary | ICD-10-CM | POA: Diagnosis not present

## 2023-10-29 DIAGNOSIS — G9341 Metabolic encephalopathy: Secondary | ICD-10-CM | POA: Diagnosis not present

## 2023-10-29 DIAGNOSIS — G309 Alzheimer's disease, unspecified: Secondary | ICD-10-CM | POA: Diagnosis not present

## 2023-10-29 DIAGNOSIS — L89613 Pressure ulcer of right heel, stage 3: Secondary | ICD-10-CM | POA: Diagnosis not present

## 2023-10-29 DIAGNOSIS — M810 Age-related osteoporosis without current pathological fracture: Secondary | ICD-10-CM | POA: Diagnosis not present

## 2023-10-29 DIAGNOSIS — I2510 Atherosclerotic heart disease of native coronary artery without angina pectoris: Secondary | ICD-10-CM | POA: Diagnosis not present

## 2023-10-29 DIAGNOSIS — K5730 Diverticulosis of large intestine without perforation or abscess without bleeding: Secondary | ICD-10-CM | POA: Diagnosis not present

## 2023-10-29 DIAGNOSIS — I129 Hypertensive chronic kidney disease with stage 1 through stage 4 chronic kidney disease, or unspecified chronic kidney disease: Secondary | ICD-10-CM | POA: Diagnosis not present

## 2023-10-29 DIAGNOSIS — Z87440 Personal history of urinary (tract) infections: Secondary | ICD-10-CM | POA: Diagnosis not present

## 2023-10-29 DIAGNOSIS — Z955 Presence of coronary angioplasty implant and graft: Secondary | ICD-10-CM | POA: Diagnosis not present

## 2023-10-29 DIAGNOSIS — Z9181 History of falling: Secondary | ICD-10-CM | POA: Diagnosis not present

## 2023-10-29 DIAGNOSIS — I82411 Acute embolism and thrombosis of right femoral vein: Secondary | ICD-10-CM | POA: Diagnosis not present

## 2023-10-29 DIAGNOSIS — E1122 Type 2 diabetes mellitus with diabetic chronic kidney disease: Secondary | ICD-10-CM | POA: Diagnosis not present

## 2023-10-29 DIAGNOSIS — H409 Unspecified glaucoma: Secondary | ICD-10-CM | POA: Diagnosis not present

## 2023-10-29 DIAGNOSIS — I700 Atherosclerosis of aorta: Secondary | ICD-10-CM | POA: Diagnosis not present

## 2023-10-29 DIAGNOSIS — Z7901 Long term (current) use of anticoagulants: Secondary | ICD-10-CM | POA: Diagnosis not present

## 2023-10-29 DIAGNOSIS — N1832 Chronic kidney disease, stage 3b: Secondary | ICD-10-CM | POA: Diagnosis not present

## 2023-10-29 DIAGNOSIS — Z85828 Personal history of other malignant neoplasm of skin: Secondary | ICD-10-CM | POA: Diagnosis not present

## 2023-10-29 DIAGNOSIS — I82451 Acute embolism and thrombosis of right peroneal vein: Secondary | ICD-10-CM | POA: Diagnosis not present

## 2023-10-29 DIAGNOSIS — L89324 Pressure ulcer of left buttock, stage 4: Secondary | ICD-10-CM | POA: Diagnosis not present

## 2023-10-31 DIAGNOSIS — L89610 Pressure ulcer of right heel, unstageable: Secondary | ICD-10-CM | POA: Diagnosis not present

## 2023-10-31 DIAGNOSIS — L89154 Pressure ulcer of sacral region, stage 4: Secondary | ICD-10-CM | POA: Diagnosis not present

## 2023-11-01 DIAGNOSIS — E1122 Type 2 diabetes mellitus with diabetic chronic kidney disease: Secondary | ICD-10-CM | POA: Diagnosis not present

## 2023-11-01 DIAGNOSIS — I2510 Atherosclerotic heart disease of native coronary artery without angina pectoris: Secondary | ICD-10-CM | POA: Diagnosis not present

## 2023-11-01 DIAGNOSIS — I82451 Acute embolism and thrombosis of right peroneal vein: Secondary | ICD-10-CM | POA: Diagnosis not present

## 2023-11-01 DIAGNOSIS — I129 Hypertensive chronic kidney disease with stage 1 through stage 4 chronic kidney disease, or unspecified chronic kidney disease: Secondary | ICD-10-CM | POA: Diagnosis not present

## 2023-11-01 DIAGNOSIS — Z7901 Long term (current) use of anticoagulants: Secondary | ICD-10-CM | POA: Diagnosis not present

## 2023-11-01 DIAGNOSIS — K5730 Diverticulosis of large intestine without perforation or abscess without bleeding: Secondary | ICD-10-CM | POA: Diagnosis not present

## 2023-11-01 DIAGNOSIS — Z87440 Personal history of urinary (tract) infections: Secondary | ICD-10-CM | POA: Diagnosis not present

## 2023-11-01 DIAGNOSIS — L89324 Pressure ulcer of left buttock, stage 4: Secondary | ICD-10-CM | POA: Diagnosis not present

## 2023-11-01 DIAGNOSIS — M810 Age-related osteoporosis without current pathological fracture: Secondary | ICD-10-CM | POA: Diagnosis not present

## 2023-11-01 DIAGNOSIS — G9341 Metabolic encephalopathy: Secondary | ICD-10-CM | POA: Diagnosis not present

## 2023-11-01 DIAGNOSIS — Z955 Presence of coronary angioplasty implant and graft: Secondary | ICD-10-CM | POA: Diagnosis not present

## 2023-11-01 DIAGNOSIS — I82411 Acute embolism and thrombosis of right femoral vein: Secondary | ICD-10-CM | POA: Diagnosis not present

## 2023-11-01 DIAGNOSIS — E785 Hyperlipidemia, unspecified: Secondary | ICD-10-CM | POA: Diagnosis not present

## 2023-11-01 DIAGNOSIS — N1832 Chronic kidney disease, stage 3b: Secondary | ICD-10-CM | POA: Diagnosis not present

## 2023-11-01 DIAGNOSIS — Z7982 Long term (current) use of aspirin: Secondary | ICD-10-CM | POA: Diagnosis not present

## 2023-11-01 DIAGNOSIS — L89613 Pressure ulcer of right heel, stage 3: Secondary | ICD-10-CM | POA: Diagnosis not present

## 2023-11-01 DIAGNOSIS — G309 Alzheimer's disease, unspecified: Secondary | ICD-10-CM | POA: Diagnosis not present

## 2023-11-01 DIAGNOSIS — E43 Unspecified severe protein-calorie malnutrition: Secondary | ICD-10-CM | POA: Diagnosis not present

## 2023-11-01 DIAGNOSIS — Z85828 Personal history of other malignant neoplasm of skin: Secondary | ICD-10-CM | POA: Diagnosis not present

## 2023-11-01 DIAGNOSIS — I700 Atherosclerosis of aorta: Secondary | ICD-10-CM | POA: Diagnosis not present

## 2023-11-01 DIAGNOSIS — H409 Unspecified glaucoma: Secondary | ICD-10-CM | POA: Diagnosis not present

## 2023-11-01 DIAGNOSIS — Z9181 History of falling: Secondary | ICD-10-CM | POA: Diagnosis not present

## 2023-11-06 DIAGNOSIS — E785 Hyperlipidemia, unspecified: Secondary | ICD-10-CM | POA: Diagnosis not present

## 2023-11-06 DIAGNOSIS — Z87440 Personal history of urinary (tract) infections: Secondary | ICD-10-CM | POA: Diagnosis not present

## 2023-11-06 DIAGNOSIS — Z85828 Personal history of other malignant neoplasm of skin: Secondary | ICD-10-CM | POA: Diagnosis not present

## 2023-11-06 DIAGNOSIS — G309 Alzheimer's disease, unspecified: Secondary | ICD-10-CM | POA: Diagnosis not present

## 2023-11-06 DIAGNOSIS — M810 Age-related osteoporosis without current pathological fracture: Secondary | ICD-10-CM | POA: Diagnosis not present

## 2023-11-06 DIAGNOSIS — Z7901 Long term (current) use of anticoagulants: Secondary | ICD-10-CM | POA: Diagnosis not present

## 2023-11-06 DIAGNOSIS — K5730 Diverticulosis of large intestine without perforation or abscess without bleeding: Secondary | ICD-10-CM | POA: Diagnosis not present

## 2023-11-06 DIAGNOSIS — I82411 Acute embolism and thrombosis of right femoral vein: Secondary | ICD-10-CM | POA: Diagnosis not present

## 2023-11-06 DIAGNOSIS — I700 Atherosclerosis of aorta: Secondary | ICD-10-CM | POA: Diagnosis not present

## 2023-11-06 DIAGNOSIS — I129 Hypertensive chronic kidney disease with stage 1 through stage 4 chronic kidney disease, or unspecified chronic kidney disease: Secondary | ICD-10-CM | POA: Diagnosis not present

## 2023-11-06 DIAGNOSIS — I2510 Atherosclerotic heart disease of native coronary artery without angina pectoris: Secondary | ICD-10-CM | POA: Diagnosis not present

## 2023-11-06 DIAGNOSIS — Z9181 History of falling: Secondary | ICD-10-CM | POA: Diagnosis not present

## 2023-11-06 DIAGNOSIS — N1832 Chronic kidney disease, stage 3b: Secondary | ICD-10-CM | POA: Diagnosis not present

## 2023-11-06 DIAGNOSIS — L89324 Pressure ulcer of left buttock, stage 4: Secondary | ICD-10-CM | POA: Diagnosis not present

## 2023-11-06 DIAGNOSIS — G9341 Metabolic encephalopathy: Secondary | ICD-10-CM | POA: Diagnosis not present

## 2023-11-06 DIAGNOSIS — Z955 Presence of coronary angioplasty implant and graft: Secondary | ICD-10-CM | POA: Diagnosis not present

## 2023-11-06 DIAGNOSIS — H409 Unspecified glaucoma: Secondary | ICD-10-CM | POA: Diagnosis not present

## 2023-11-06 DIAGNOSIS — I82451 Acute embolism and thrombosis of right peroneal vein: Secondary | ICD-10-CM | POA: Diagnosis not present

## 2023-11-06 DIAGNOSIS — E43 Unspecified severe protein-calorie malnutrition: Secondary | ICD-10-CM | POA: Diagnosis not present

## 2023-11-06 DIAGNOSIS — Z7982 Long term (current) use of aspirin: Secondary | ICD-10-CM | POA: Diagnosis not present

## 2023-11-06 DIAGNOSIS — E1122 Type 2 diabetes mellitus with diabetic chronic kidney disease: Secondary | ICD-10-CM | POA: Diagnosis not present

## 2023-11-06 DIAGNOSIS — L89613 Pressure ulcer of right heel, stage 3: Secondary | ICD-10-CM | POA: Diagnosis not present

## 2023-11-08 DIAGNOSIS — N1832 Chronic kidney disease, stage 3b: Secondary | ICD-10-CM | POA: Diagnosis not present

## 2023-11-08 DIAGNOSIS — Z85828 Personal history of other malignant neoplasm of skin: Secondary | ICD-10-CM | POA: Diagnosis not present

## 2023-11-08 DIAGNOSIS — M810 Age-related osteoporosis without current pathological fracture: Secondary | ICD-10-CM | POA: Diagnosis not present

## 2023-11-08 DIAGNOSIS — I700 Atherosclerosis of aorta: Secondary | ICD-10-CM | POA: Diagnosis not present

## 2023-11-08 DIAGNOSIS — I82411 Acute embolism and thrombosis of right femoral vein: Secondary | ICD-10-CM | POA: Diagnosis not present

## 2023-11-08 DIAGNOSIS — L89613 Pressure ulcer of right heel, stage 3: Secondary | ICD-10-CM | POA: Diagnosis not present

## 2023-11-08 DIAGNOSIS — G309 Alzheimer's disease, unspecified: Secondary | ICD-10-CM | POA: Diagnosis not present

## 2023-11-08 DIAGNOSIS — Z7982 Long term (current) use of aspirin: Secondary | ICD-10-CM | POA: Diagnosis not present

## 2023-11-08 DIAGNOSIS — Z7901 Long term (current) use of anticoagulants: Secondary | ICD-10-CM | POA: Diagnosis not present

## 2023-11-08 DIAGNOSIS — Z9181 History of falling: Secondary | ICD-10-CM | POA: Diagnosis not present

## 2023-11-08 DIAGNOSIS — G9341 Metabolic encephalopathy: Secondary | ICD-10-CM | POA: Diagnosis not present

## 2023-11-08 DIAGNOSIS — I2510 Atherosclerotic heart disease of native coronary artery without angina pectoris: Secondary | ICD-10-CM | POA: Diagnosis not present

## 2023-11-08 DIAGNOSIS — E43 Unspecified severe protein-calorie malnutrition: Secondary | ICD-10-CM | POA: Diagnosis not present

## 2023-11-08 DIAGNOSIS — Z955 Presence of coronary angioplasty implant and graft: Secondary | ICD-10-CM | POA: Diagnosis not present

## 2023-11-08 DIAGNOSIS — Z87440 Personal history of urinary (tract) infections: Secondary | ICD-10-CM | POA: Diagnosis not present

## 2023-11-08 DIAGNOSIS — I82451 Acute embolism and thrombosis of right peroneal vein: Secondary | ICD-10-CM | POA: Diagnosis not present

## 2023-11-08 DIAGNOSIS — E1122 Type 2 diabetes mellitus with diabetic chronic kidney disease: Secondary | ICD-10-CM | POA: Diagnosis not present

## 2023-11-08 DIAGNOSIS — E785 Hyperlipidemia, unspecified: Secondary | ICD-10-CM | POA: Diagnosis not present

## 2023-11-08 DIAGNOSIS — I129 Hypertensive chronic kidney disease with stage 1 through stage 4 chronic kidney disease, or unspecified chronic kidney disease: Secondary | ICD-10-CM | POA: Diagnosis not present

## 2023-11-08 DIAGNOSIS — K5730 Diverticulosis of large intestine without perforation or abscess without bleeding: Secondary | ICD-10-CM | POA: Diagnosis not present

## 2023-11-08 DIAGNOSIS — L89324 Pressure ulcer of left buttock, stage 4: Secondary | ICD-10-CM | POA: Diagnosis not present

## 2023-11-08 DIAGNOSIS — H409 Unspecified glaucoma: Secondary | ICD-10-CM | POA: Diagnosis not present

## 2023-11-09 ENCOUNTER — Encounter (HOSPITAL_BASED_OUTPATIENT_CLINIC_OR_DEPARTMENT_OTHER): Attending: Internal Medicine | Admitting: Internal Medicine

## 2023-11-09 DIAGNOSIS — F0390 Unspecified dementia without behavioral disturbance: Secondary | ICD-10-CM | POA: Insufficient documentation

## 2023-11-09 DIAGNOSIS — L89154 Pressure ulcer of sacral region, stage 4: Secondary | ICD-10-CM | POA: Diagnosis not present

## 2023-11-13 DIAGNOSIS — N1832 Chronic kidney disease, stage 3b: Secondary | ICD-10-CM | POA: Diagnosis not present

## 2023-11-13 DIAGNOSIS — I82411 Acute embolism and thrombosis of right femoral vein: Secondary | ICD-10-CM | POA: Diagnosis not present

## 2023-11-13 DIAGNOSIS — I2510 Atherosclerotic heart disease of native coronary artery without angina pectoris: Secondary | ICD-10-CM | POA: Diagnosis not present

## 2023-11-13 DIAGNOSIS — I82451 Acute embolism and thrombosis of right peroneal vein: Secondary | ICD-10-CM | POA: Diagnosis not present

## 2023-11-13 DIAGNOSIS — Z7901 Long term (current) use of anticoagulants: Secondary | ICD-10-CM | POA: Diagnosis not present

## 2023-11-13 DIAGNOSIS — Z7982 Long term (current) use of aspirin: Secondary | ICD-10-CM | POA: Diagnosis not present

## 2023-11-13 DIAGNOSIS — Z85828 Personal history of other malignant neoplasm of skin: Secondary | ICD-10-CM | POA: Diagnosis not present

## 2023-11-13 DIAGNOSIS — E785 Hyperlipidemia, unspecified: Secondary | ICD-10-CM | POA: Diagnosis not present

## 2023-11-13 DIAGNOSIS — H409 Unspecified glaucoma: Secondary | ICD-10-CM | POA: Diagnosis not present

## 2023-11-13 DIAGNOSIS — G9341 Metabolic encephalopathy: Secondary | ICD-10-CM | POA: Diagnosis not present

## 2023-11-13 DIAGNOSIS — E43 Unspecified severe protein-calorie malnutrition: Secondary | ICD-10-CM | POA: Diagnosis not present

## 2023-11-13 DIAGNOSIS — I129 Hypertensive chronic kidney disease with stage 1 through stage 4 chronic kidney disease, or unspecified chronic kidney disease: Secondary | ICD-10-CM | POA: Diagnosis not present

## 2023-11-13 DIAGNOSIS — M810 Age-related osteoporosis without current pathological fracture: Secondary | ICD-10-CM | POA: Diagnosis not present

## 2023-11-13 DIAGNOSIS — Z87440 Personal history of urinary (tract) infections: Secondary | ICD-10-CM | POA: Diagnosis not present

## 2023-11-13 DIAGNOSIS — K5730 Diverticulosis of large intestine without perforation or abscess without bleeding: Secondary | ICD-10-CM | POA: Diagnosis not present

## 2023-11-13 DIAGNOSIS — L89324 Pressure ulcer of left buttock, stage 4: Secondary | ICD-10-CM | POA: Diagnosis not present

## 2023-11-13 DIAGNOSIS — Z9181 History of falling: Secondary | ICD-10-CM | POA: Diagnosis not present

## 2023-11-13 DIAGNOSIS — G309 Alzheimer's disease, unspecified: Secondary | ICD-10-CM | POA: Diagnosis not present

## 2023-11-13 DIAGNOSIS — L89613 Pressure ulcer of right heel, stage 3: Secondary | ICD-10-CM | POA: Diagnosis not present

## 2023-11-13 DIAGNOSIS — I700 Atherosclerosis of aorta: Secondary | ICD-10-CM | POA: Diagnosis not present

## 2023-11-13 DIAGNOSIS — Z955 Presence of coronary angioplasty implant and graft: Secondary | ICD-10-CM | POA: Diagnosis not present

## 2023-11-13 DIAGNOSIS — E1122 Type 2 diabetes mellitus with diabetic chronic kidney disease: Secondary | ICD-10-CM | POA: Diagnosis not present

## 2023-11-14 DIAGNOSIS — E1122 Type 2 diabetes mellitus with diabetic chronic kidney disease: Secondary | ICD-10-CM | POA: Diagnosis not present

## 2023-11-14 DIAGNOSIS — I82451 Acute embolism and thrombosis of right peroneal vein: Secondary | ICD-10-CM | POA: Diagnosis not present

## 2023-11-14 DIAGNOSIS — K5730 Diverticulosis of large intestine without perforation or abscess without bleeding: Secondary | ICD-10-CM | POA: Diagnosis not present

## 2023-11-14 DIAGNOSIS — E43 Unspecified severe protein-calorie malnutrition: Secondary | ICD-10-CM | POA: Diagnosis not present

## 2023-11-14 DIAGNOSIS — I700 Atherosclerosis of aorta: Secondary | ICD-10-CM | POA: Diagnosis not present

## 2023-11-14 DIAGNOSIS — L89324 Pressure ulcer of left buttock, stage 4: Secondary | ICD-10-CM | POA: Diagnosis not present

## 2023-11-14 DIAGNOSIS — N1832 Chronic kidney disease, stage 3b: Secondary | ICD-10-CM | POA: Diagnosis not present

## 2023-11-14 DIAGNOSIS — G309 Alzheimer's disease, unspecified: Secondary | ICD-10-CM | POA: Diagnosis not present

## 2023-11-14 DIAGNOSIS — I129 Hypertensive chronic kidney disease with stage 1 through stage 4 chronic kidney disease, or unspecified chronic kidney disease: Secondary | ICD-10-CM | POA: Diagnosis not present

## 2023-11-14 DIAGNOSIS — I82411 Acute embolism and thrombosis of right femoral vein: Secondary | ICD-10-CM | POA: Diagnosis not present

## 2023-11-14 DIAGNOSIS — M810 Age-related osteoporosis without current pathological fracture: Secondary | ICD-10-CM | POA: Diagnosis not present

## 2023-11-16 DIAGNOSIS — Z9181 History of falling: Secondary | ICD-10-CM | POA: Diagnosis not present

## 2023-11-16 DIAGNOSIS — K5730 Diverticulosis of large intestine without perforation or abscess without bleeding: Secondary | ICD-10-CM | POA: Diagnosis not present

## 2023-11-16 DIAGNOSIS — Z7982 Long term (current) use of aspirin: Secondary | ICD-10-CM | POA: Diagnosis not present

## 2023-11-16 DIAGNOSIS — M810 Age-related osteoporosis without current pathological fracture: Secondary | ICD-10-CM | POA: Diagnosis not present

## 2023-11-16 DIAGNOSIS — I129 Hypertensive chronic kidney disease with stage 1 through stage 4 chronic kidney disease, or unspecified chronic kidney disease: Secondary | ICD-10-CM | POA: Diagnosis not present

## 2023-11-16 DIAGNOSIS — I82411 Acute embolism and thrombosis of right femoral vein: Secondary | ICD-10-CM | POA: Diagnosis not present

## 2023-11-16 DIAGNOSIS — G309 Alzheimer's disease, unspecified: Secondary | ICD-10-CM | POA: Diagnosis not present

## 2023-11-16 DIAGNOSIS — I2510 Atherosclerotic heart disease of native coronary artery without angina pectoris: Secondary | ICD-10-CM | POA: Diagnosis not present

## 2023-11-16 DIAGNOSIS — Z955 Presence of coronary angioplasty implant and graft: Secondary | ICD-10-CM | POA: Diagnosis not present

## 2023-11-16 DIAGNOSIS — E43 Unspecified severe protein-calorie malnutrition: Secondary | ICD-10-CM | POA: Diagnosis not present

## 2023-11-16 DIAGNOSIS — Z87440 Personal history of urinary (tract) infections: Secondary | ICD-10-CM | POA: Diagnosis not present

## 2023-11-16 DIAGNOSIS — L89324 Pressure ulcer of left buttock, stage 4: Secondary | ICD-10-CM | POA: Diagnosis not present

## 2023-11-16 DIAGNOSIS — Z85828 Personal history of other malignant neoplasm of skin: Secondary | ICD-10-CM | POA: Diagnosis not present

## 2023-11-16 DIAGNOSIS — N1832 Chronic kidney disease, stage 3b: Secondary | ICD-10-CM | POA: Diagnosis not present

## 2023-11-16 DIAGNOSIS — H409 Unspecified glaucoma: Secondary | ICD-10-CM | POA: Diagnosis not present

## 2023-11-16 DIAGNOSIS — E1122 Type 2 diabetes mellitus with diabetic chronic kidney disease: Secondary | ICD-10-CM | POA: Diagnosis not present

## 2023-11-16 DIAGNOSIS — I82451 Acute embolism and thrombosis of right peroneal vein: Secondary | ICD-10-CM | POA: Diagnosis not present

## 2023-11-16 DIAGNOSIS — E785 Hyperlipidemia, unspecified: Secondary | ICD-10-CM | POA: Diagnosis not present

## 2023-11-16 DIAGNOSIS — I700 Atherosclerosis of aorta: Secondary | ICD-10-CM | POA: Diagnosis not present

## 2023-11-21 DIAGNOSIS — E785 Hyperlipidemia, unspecified: Secondary | ICD-10-CM | POA: Diagnosis not present

## 2023-11-21 DIAGNOSIS — I82411 Acute embolism and thrombosis of right femoral vein: Secondary | ICD-10-CM | POA: Diagnosis not present

## 2023-11-21 DIAGNOSIS — Z955 Presence of coronary angioplasty implant and graft: Secondary | ICD-10-CM | POA: Diagnosis not present

## 2023-11-21 DIAGNOSIS — E43 Unspecified severe protein-calorie malnutrition: Secondary | ICD-10-CM | POA: Diagnosis not present

## 2023-11-21 DIAGNOSIS — G309 Alzheimer's disease, unspecified: Secondary | ICD-10-CM | POA: Diagnosis not present

## 2023-11-21 DIAGNOSIS — M810 Age-related osteoporosis without current pathological fracture: Secondary | ICD-10-CM | POA: Diagnosis not present

## 2023-11-21 DIAGNOSIS — N1832 Chronic kidney disease, stage 3b: Secondary | ICD-10-CM | POA: Diagnosis not present

## 2023-11-21 DIAGNOSIS — Z9181 History of falling: Secondary | ICD-10-CM | POA: Diagnosis not present

## 2023-11-21 DIAGNOSIS — Z7982 Long term (current) use of aspirin: Secondary | ICD-10-CM | POA: Diagnosis not present

## 2023-11-21 DIAGNOSIS — H409 Unspecified glaucoma: Secondary | ICD-10-CM | POA: Diagnosis not present

## 2023-11-21 DIAGNOSIS — I82451 Acute embolism and thrombosis of right peroneal vein: Secondary | ICD-10-CM | POA: Diagnosis not present

## 2023-11-21 DIAGNOSIS — I2510 Atherosclerotic heart disease of native coronary artery without angina pectoris: Secondary | ICD-10-CM | POA: Diagnosis not present

## 2023-11-21 DIAGNOSIS — Z87440 Personal history of urinary (tract) infections: Secondary | ICD-10-CM | POA: Diagnosis not present

## 2023-11-21 DIAGNOSIS — K5730 Diverticulosis of large intestine without perforation or abscess without bleeding: Secondary | ICD-10-CM | POA: Diagnosis not present

## 2023-11-21 DIAGNOSIS — I129 Hypertensive chronic kidney disease with stage 1 through stage 4 chronic kidney disease, or unspecified chronic kidney disease: Secondary | ICD-10-CM | POA: Diagnosis not present

## 2023-11-21 DIAGNOSIS — I700 Atherosclerosis of aorta: Secondary | ICD-10-CM | POA: Diagnosis not present

## 2023-11-21 DIAGNOSIS — E1122 Type 2 diabetes mellitus with diabetic chronic kidney disease: Secondary | ICD-10-CM | POA: Diagnosis not present

## 2023-11-21 DIAGNOSIS — L89324 Pressure ulcer of left buttock, stage 4: Secondary | ICD-10-CM | POA: Diagnosis not present

## 2023-11-21 DIAGNOSIS — Z85828 Personal history of other malignant neoplasm of skin: Secondary | ICD-10-CM | POA: Diagnosis not present

## 2023-11-22 ENCOUNTER — Other Ambulatory Visit: Payer: Self-pay

## 2023-11-22 ENCOUNTER — Emergency Department (HOSPITAL_BASED_OUTPATIENT_CLINIC_OR_DEPARTMENT_OTHER)
Admission: EM | Admit: 2023-11-22 | Discharge: 2023-11-22 | Disposition: A | Discharge status: Home / Self Care | Attending: Emergency Medicine | Admitting: Emergency Medicine

## 2023-11-22 ENCOUNTER — Emergency Department (HOSPITAL_BASED_OUTPATIENT_CLINIC_OR_DEPARTMENT_OTHER)

## 2023-11-22 DIAGNOSIS — I10 Essential (primary) hypertension: Secondary | ICD-10-CM | POA: Insufficient documentation

## 2023-11-22 DIAGNOSIS — L89159 Pressure ulcer of sacral region, unspecified stage: Secondary | ICD-10-CM | POA: Insufficient documentation

## 2023-11-22 DIAGNOSIS — N3000 Acute cystitis without hematuria: Secondary | ICD-10-CM | POA: Diagnosis not present

## 2023-11-22 DIAGNOSIS — E119 Type 2 diabetes mellitus without complications: Secondary | ICD-10-CM | POA: Diagnosis not present

## 2023-11-22 DIAGNOSIS — K5730 Diverticulosis of large intestine without perforation or abscess without bleeding: Secondary | ICD-10-CM | POA: Insufficient documentation

## 2023-11-22 DIAGNOSIS — R404 Transient alteration of awareness: Secondary | ICD-10-CM | POA: Diagnosis not present

## 2023-11-22 DIAGNOSIS — Z7901 Long term (current) use of anticoagulants: Secondary | ICD-10-CM | POA: Diagnosis not present

## 2023-11-22 DIAGNOSIS — N1330 Unspecified hydronephrosis: Secondary | ICD-10-CM | POA: Insufficient documentation

## 2023-11-22 DIAGNOSIS — R410 Disorientation, unspecified: Secondary | ICD-10-CM | POA: Diagnosis not present

## 2023-11-22 DIAGNOSIS — Z7982 Long term (current) use of aspirin: Secondary | ICD-10-CM | POA: Insufficient documentation

## 2023-11-22 DIAGNOSIS — N281 Cyst of kidney, acquired: Secondary | ICD-10-CM | POA: Diagnosis not present

## 2023-11-22 DIAGNOSIS — Z85828 Personal history of other malignant neoplasm of skin: Secondary | ICD-10-CM | POA: Diagnosis not present

## 2023-11-22 DIAGNOSIS — I6782 Cerebral ischemia: Secondary | ICD-10-CM | POA: Diagnosis not present

## 2023-11-22 DIAGNOSIS — R4182 Altered mental status, unspecified: Secondary | ICD-10-CM | POA: Diagnosis present

## 2023-11-22 DIAGNOSIS — I700 Atherosclerosis of aorta: Secondary | ICD-10-CM | POA: Diagnosis not present

## 2023-11-22 LAB — CBC WITH DIFFERENTIAL/PLATELET
Abs Immature Granulocytes: 0.01 10*3/uL (ref 0.00–0.07)
Basophils Absolute: 0.1 10*3/uL (ref 0.0–0.1)
Basophils Relative: 1 %
Eosinophils Absolute: 0.3 10*3/uL (ref 0.0–0.5)
Eosinophils Relative: 4 %
HCT: 34.1 % — ABNORMAL LOW (ref 36.0–46.0)
Hemoglobin: 11.3 g/dL — ABNORMAL LOW (ref 12.0–15.0)
Immature Granulocytes: 0 %
Lymphocytes Relative: 25 %
Lymphs Abs: 1.5 10*3/uL (ref 0.7–4.0)
MCH: 27.5 pg (ref 26.0–34.0)
MCHC: 33.1 g/dL (ref 30.0–36.0)
MCV: 83 fL (ref 80.0–100.0)
Monocytes Absolute: 0.6 10*3/uL (ref 0.1–1.0)
Monocytes Relative: 10 %
Neutro Abs: 3.6 10*3/uL (ref 1.7–7.7)
Neutrophils Relative %: 60 %
Platelets: 269 10*3/uL (ref 150–400)
RBC: 4.11 MIL/uL (ref 3.87–5.11)
RDW: 17.4 % — ABNORMAL HIGH (ref 11.5–15.5)
WBC: 6 10*3/uL (ref 4.0–10.5)
nRBC: 0 % (ref 0.0–0.2)

## 2023-11-22 LAB — URINALYSIS, W/ REFLEX TO CULTURE (INFECTION SUSPECTED)
Bilirubin Urine: NEGATIVE
Glucose, UA: NEGATIVE mg/dL
Hgb urine dipstick: NEGATIVE
Ketones, ur: NEGATIVE mg/dL
Nitrite: NEGATIVE
Protein, ur: NEGATIVE mg/dL
Specific Gravity, Urine: 1.005 (ref 1.005–1.030)
pH: 7 (ref 5.0–8.0)

## 2023-11-22 LAB — BASIC METABOLIC PANEL WITH GFR
Anion gap: 10 (ref 5–15)
BUN: 12 mg/dL (ref 8–23)
CO2: 22 mmol/L (ref 22–32)
Calcium: 10 mg/dL (ref 8.9–10.3)
Chloride: 101 mmol/L (ref 98–111)
Creatinine, Ser: 0.43 mg/dL — ABNORMAL LOW (ref 0.44–1.00)
GFR, Estimated: >60 mL/min (ref >60)
Glucose, Bld: 134 mg/dL — ABNORMAL HIGH (ref 70–99)
Potassium: 4.2 mmol/L (ref 3.5–5.1)
Sodium: 133 mmol/L — ABNORMAL LOW (ref 135–145)

## 2023-11-22 MED ORDER — SULFAMETHOXAZOLE-TRIMETHOPRIM 800-160 MG PO TABS: 1.0000 | ORAL_TABLET | Freq: Two times a day (BID) | ORAL | 0 refills | Status: AC

## 2023-11-22 MED ORDER — LACTATED RINGERS IV BOLUS
1000.0000 mL | Freq: Once | INTRAVENOUS | Status: AC
  Administered 2023-11-22: 1000 mL via INTRAVENOUS

## 2023-11-22 MED ORDER — SULFAMETHOXAZOLE-TRIMETHOPRIM 800-160 MG PO TABS
1.0000 | ORAL_TABLET | Freq: Once | ORAL | Status: AC
  Administered 2023-11-22: 1 via ORAL
  Filled 2023-11-22: qty 1

## 2023-11-22 NOTE — ED Notes (Signed)
 Pt discharged home and given discharge paperwork. Opportunities given for questions. Pt verbalizes understanding. PIV removed x1. Jillyn Hidden , RN

## 2023-11-22 NOTE — ED Notes (Signed)
 Pt I&O cath. Urine sent

## 2023-11-22 NOTE — ED Provider Notes (Signed)
 [place] EMERGENCY DEPARTMENT AT [facility] Provider Note  CSN: [id number] Arrival date & time: 11/22/23 1224  Chief Complaint(s) Altered Mental Status  HPI LAASIA ARCOS is a 89 y.o. female who is here with her son who is her primary caregiver.  Patient is bedbound, lives at home with family.  Family reports over the last 1 day, patient has been more out of it.  They are concerned that she has a UTI as this was how she presented previously.  Patient has bedsores which are currently being treated by wound care and are doing well.  She has not had fever.  Son reports that the patient has been having regular bowel movements ever since starting MiraLAX  regularly.  He is concerned that there is vaginal swelling.   Past Medical History Past Medical History:  Diagnosis Date   Diabetes mellitus    High cholesterol    Hypertension    S/P angioplasty with stent    #3 stents placed,    Skin cancer of nose    Patient Active Problem List   Diagnosis Date Noted   Acute encephalopathy 05/12/2023   UTI (urinary tract infection) 05/12/2023   Right leg DVT (HCC) 05/12/2023   Leukopenia 09/14/2021   Disorder of female genital organs 09/14/2021   Age-related osteoporosis without current pathological fracture 06/28/2021   Adjustment disorder with depressed mood 06/28/2021   Memory impairment 06/28/2021   Angina pectoris (HCC) 06/28/2021   Atrophic vaginitis 06/28/2021   Glaucoma 06/28/2021   Microalbuminuria 06/28/2021   Occlusion and stenosis of right carotid artery 06/28/2021   Tear film insufficiency 06/28/2021   Type 2 diabetes mellitus with unspecified complications (HCC) 06/28/2021   Atherosclerotic heart disease of native coronary artery without angina pectoris 08/06/2013   Edema 08/06/2013   Hypertension    Mixed hyperlipidemia    S/P angioplasty with stent    Skin cancer of nose    Osteoarthritis of both knees 01/03/2013   Home Medication(s) Prior to Admission  medications   Medication Sig Start Date End Date Taking? Authorizing Provider  acarbose  (PRECOSE ) 25 MG tablet Take 25 mg by mouth every evening.     [provider]  acetaminophen  (TYLENOL ) 500 MG tablet Take 1 tablet (500 mg total) by mouth every 4 (four) hours as needed for moderate pain. 04/22/17   Mesner, Selinda, MD  apixaban  (ELIQUIS ) 5 MG TABS tablet Take 2 tablets (10 mg total) by mouth 2 (two) times daily for 7 days, THEN 1 tablet (5 mg total) 2 (two) times daily for 23 days. 05/14/23 06/13/23  Bryn Bernardino NOVAK, MD  ASPIRIN  LOW DOSE 81 MG EC tablet Take 81 mg by mouth daily. 08/30/21   [provider]  cromolyn  (OPTICROM ) 4 % ophthalmic solution 1 drop 4 (four) times daily. 05/26/21   [provider]  cycloSPORINE  (RESTASIS ) 0.05 % ophthalmic emulsion Place 1 drop into both eyes 2 (two) times daily. Use for dry eyes.    [provider]  denosumab  (PROLIA ) 60 MG/ML SOSY injection Inject 60 mg into the skin every 6 (six) months. September/October was last injection, Caregiver wasn't sure which month.    [provider]  dorzolamide -timolol  (COSOPT ) 22.3-6.8 MG/ML ophthalmic solution Place 1 drop into both eyes 2 (two) times daily. Use for glaucoma    [provider]  Eyelid Cleansers (OCUSOFT LID SCRUB EX) Apply topically 2 (two) times daily. As directed    [provider]  latanoprost  (XALATAN ) 0.005 % ophthalmic solution Place 1  drop into both eyes at bedtime.     [provider]  Multiple Vitamin (MULTIVITAMIN) capsule Take 1 capsule by mouth daily. Patient takes Centrum Silver A-Z    [provider]  polyethylene glycol (MIRALAX ) 17 g packet Take 17 g by mouth daily. 07/16/23   Bero, Michael M, MD  Polyvinyl Alcohol -Povidone PF (REFRESH) 1.4-0.6 % SOLN See admin instructions.    [provider]  rosuvastatin  (CRESTOR ) 10 MG tablet Take 10 mg by mouth daily.    [provider]                                                                                                                                     Past Surgical History Past Surgical History:  Procedure Laterality Date   APPENDECTOMY     TONSILLECTOMY     Family History Family History  Problem Relation Age of Onset   Hypertension Brother    Heart attack Mother    Stroke Neg Hx     Social History Social History   Tobacco Use   Smoking status: Never   Smokeless tobacco: Never  Vaping Use   Vaping status: Never Used  Substance Use Topics   Alcohol  use: No   Drug use: No   Allergies Atorvastatin, Nsaids, Cephalexin, Codeine, Ibandronate, Lipitor [atorvastatin calcium ], Niaspan [niacin er (antihyperlipidemic)], Nitrofurantoin, Ticlid [ticlopidine hcl], and Zocor [simvastatin - high dose]  Review of Systems Review of Systems  Physical Exam Vital Signs  I have reviewed the triage vital signs BP 113/75 (BP Location: Left Arm)   Pulse 74   Temp 97.7 F (36.5 C)   Resp 16   SpO2 97%   Physical Exam Vitals and nursing note reviewed.  HENT:     Mouth/Throat:     Mouth: Mucous membranes are dry.   Eyes:     Pupils: Pupils are equal, round, and reactive to light.    Cardiovascular:     Rate and Rhythm: Normal rate.  Pulmonary:     Effort: Pulmonary effort is normal.  Abdominal:     General: Abdomen is flat. There is no distension.     Palpations: Abdomen is soft.     Tenderness: There is no abdominal tenderness. There is no guarding.  Genitourinary:    General: Normal vulva.     Vagina: No vaginal discharge.     Comments: I did not appreciate any substantial labial swelling or erythema  Skin:    Comments: Well-healing sacral decubitus ulcer wound   Neurological:     Mental Status: She is alert. Mental status is at baseline.     ED Results and Treatments Labs (all labs ordered are listed, but only abnormal results are displayed) Labs Reviewed  BASIC METABOLIC PANEL WITH GFR - Abnormal; Notable  for the following components:      Result Value   Sodium 133 (*)    Glucose, Bld 134 (*)  Creatinine, Ser 0.43 (*)    All other components within normal limits  CBC WITH DIFFERENTIAL/PLATELET - Abnormal; Notable for the following components:   Hemoglobin 11.3 (*)    HCT 34.1 (*)    RDW 17.4 (*)    All other components within normal limits  URINALYSIS, W/ REFLEX TO CULTURE (INFECTION SUSPECTED)                                                                                                                          Radiology No results found.  Pertinent labs & imaging results that were available during my care of the patient were reviewed by me and considered in my medical decision making (see MDM for details).  Medications Ordered in ED Medications  lactated ringers bolus 1,000 mL (1,000 mLs Intravenous New Bag/Given 11/22/23 1406)                                                                                                                                     Procedures Procedures  (including critical care time)  Medical Decision Making / ED Course   This patient presents to the ED for concern of lethargy, this involves an extensive number of treatment options, and is a complaint that carries with it a high risk of complications and morbidity.  The differential diagnosis includes dehydration, encephalitis, cystitis, sepsis, bowel obstruction, less likely impaction.  MDM: Family's goal is for the patient to be able to return home.  With that in mind, a bit more limited for workup I believe is appropriate.  Patient has normal vital signs, likely does have some degree of metabolic encephalopathy.  Will check blood work on the patient and provide some IV fluids.  Have ordered straight cath.  Will obtain noncontrasted imaging of the patient's abdomen to rule out obstructive etiology given patient's advanced age and unreliable physical exam.  Patient was signed out to Dr. Ismael  pending CT, urinalysis and reassessment.   Additional history obtained: -Additional history obtained from son at bedside -External records from outside source obtained and reviewed including: Chart review including previous notes, labs, imaging, consultation notes   Lab Tests: -I ordered, reviewed, and interpreted labs.   The pertinent results include:   Labs Reviewed  BASIC METABOLIC PANEL WITH GFR - Abnormal; Notable for the following components:      Result Value   Sodium 133 (*)  Glucose, Bld 134 (*)    Creatinine, Ser 0.43 (*)    All other components within normal limits  CBC WITH DIFFERENTIAL/PLATELET - Abnormal; Notable for the following components:   Hemoglobin 11.3 (*)    HCT 34.1 (*)    RDW 17.4 (*)    All other components within normal limits  URINALYSIS, W/ REFLEX TO CULTURE (INFECTION SUSPECTED)     Medicines ordered and prescription drug management: Meds ordered this encounter  Medications   lactated ringers bolus 1,000 mL    -I have reviewed the patients home medicines and have made adjustments as needed   Cardiac Monitoring: The patient was maintained on a cardiac monitor.  I personally viewed and interpreted the cardiac monitored which showed an underlying rhythm of: Normal sinus rhythm  Social Determinants of Health:  Factors impacting patients care include: Advanced age   Reevaluation: After the interventions noted above, I reevaluated the patient and found that they have :improved  Co morbidities that complicate the patient evaluation  Past Medical History:  Diagnosis Date   Diabetes mellitus    High cholesterol    Hypertension    S/P angioplasty with stent    #3 stents placed,    Skin cancer of nose         Final Clinical Impression(s) / ED Diagnoses Final diagnoses:  Transient alteration of awareness     @PCDICTATION @    Mannie Pac T, DO 11/22/23 1445

## 2023-11-22 NOTE — ED Triage Notes (Signed)
 Family states patient has been out of it since yesterday. Recently dx with UTI. Reports very little urinary output and swollen vagina   Also states multiple sores form bed.

## 2023-11-22 NOTE — ED Provider Notes (Signed)
 F/u resultes, getting fluids and CT. Possible d/c depending on results Physical Exam  BP 113/75 (BP Location: Left Arm)   Pulse 74   Temp 97.7 F (36.5 C)   Resp 16   SpO2 97%   Physical Exam  Procedures  Procedures  ED Course / MDM    Medical Decision Making Amount and/or Complexity of Data Reviewed Labs: ordered. Radiology: ordered.  Risk Prescription drug management.   Urinalysis moderate leukocytes 11-20 WBCs.  CT scan interpreted by radiology bladder thickening suggestive of cystitis no other acute findings.  At this time with combination of patient's presenting symptoms, urinalysis positive and CT showing signs of potential cystitis, will opt to treat.  I have reviewed EMR for prior culture results and patient's allergies.  At this time Bactrim  is the most closely fitting antibiotic therapy.  Will initiate Bactrim  outpatient basis.  I have reviewed this with the patient's son and the patient.  Patient's son reports they have caregivers at home and are able to care for the patient in the home setting.       Armenta Canning, MD 11/22/23 [phone]

## 2023-11-23 ENCOUNTER — Encounter (HOSPITAL_BASED_OUTPATIENT_CLINIC_OR_DEPARTMENT_OTHER): Admitting: Internal Medicine

## 2023-11-23 DIAGNOSIS — F0390 Unspecified dementia without behavioral disturbance: Secondary | ICD-10-CM

## 2023-11-23 DIAGNOSIS — L89154 Pressure ulcer of sacral region, stage 4: Secondary | ICD-10-CM

## 2023-11-23 LAB — URINE CULTURE: Culture: <10000 — AB

## 2023-11-26 DIAGNOSIS — I82411 Acute embolism and thrombosis of right femoral vein: Secondary | ICD-10-CM | POA: Diagnosis not present

## 2023-11-26 DIAGNOSIS — H409 Unspecified glaucoma: Secondary | ICD-10-CM | POA: Diagnosis not present

## 2023-11-26 DIAGNOSIS — Z955 Presence of coronary angioplasty implant and graft: Secondary | ICD-10-CM | POA: Diagnosis not present

## 2023-11-26 DIAGNOSIS — Z87440 Personal history of urinary (tract) infections: Secondary | ICD-10-CM | POA: Diagnosis not present

## 2023-11-26 DIAGNOSIS — Z9181 History of falling: Secondary | ICD-10-CM | POA: Diagnosis not present

## 2023-11-26 DIAGNOSIS — G309 Alzheimer's disease, unspecified: Secondary | ICD-10-CM | POA: Diagnosis not present

## 2023-11-26 DIAGNOSIS — E43 Unspecified severe protein-calorie malnutrition: Secondary | ICD-10-CM | POA: Diagnosis not present

## 2023-11-26 DIAGNOSIS — N1832 Chronic kidney disease, stage 3b: Secondary | ICD-10-CM | POA: Diagnosis not present

## 2023-11-26 DIAGNOSIS — K5730 Diverticulosis of large intestine without perforation or abscess without bleeding: Secondary | ICD-10-CM | POA: Diagnosis not present

## 2023-11-26 DIAGNOSIS — I82451 Acute embolism and thrombosis of right peroneal vein: Secondary | ICD-10-CM | POA: Diagnosis not present

## 2023-11-26 DIAGNOSIS — Z7982 Long term (current) use of aspirin: Secondary | ICD-10-CM | POA: Diagnosis not present

## 2023-11-26 DIAGNOSIS — Z85828 Personal history of other malignant neoplasm of skin: Secondary | ICD-10-CM | POA: Diagnosis not present

## 2023-11-26 DIAGNOSIS — L89324 Pressure ulcer of left buttock, stage 4: Secondary | ICD-10-CM | POA: Diagnosis not present

## 2023-11-26 DIAGNOSIS — E785 Hyperlipidemia, unspecified: Secondary | ICD-10-CM | POA: Diagnosis not present

## 2023-11-26 DIAGNOSIS — I129 Hypertensive chronic kidney disease with stage 1 through stage 4 chronic kidney disease, or unspecified chronic kidney disease: Secondary | ICD-10-CM | POA: Diagnosis not present

## 2023-11-26 DIAGNOSIS — M810 Age-related osteoporosis without current pathological fracture: Secondary | ICD-10-CM | POA: Diagnosis not present

## 2023-11-26 DIAGNOSIS — I2510 Atherosclerotic heart disease of native coronary artery without angina pectoris: Secondary | ICD-10-CM | POA: Diagnosis not present

## 2023-11-26 DIAGNOSIS — E1122 Type 2 diabetes mellitus with diabetic chronic kidney disease: Secondary | ICD-10-CM | POA: Diagnosis not present

## 2023-11-26 DIAGNOSIS — I700 Atherosclerosis of aorta: Secondary | ICD-10-CM | POA: Diagnosis not present

## 2023-11-28 DIAGNOSIS — I129 Hypertensive chronic kidney disease with stage 1 through stage 4 chronic kidney disease, or unspecified chronic kidney disease: Secondary | ICD-10-CM | POA: Diagnosis not present

## 2023-11-28 DIAGNOSIS — M810 Age-related osteoporosis without current pathological fracture: Secondary | ICD-10-CM | POA: Diagnosis not present

## 2023-11-28 DIAGNOSIS — G309 Alzheimer's disease, unspecified: Secondary | ICD-10-CM | POA: Diagnosis not present

## 2023-11-28 DIAGNOSIS — E43 Unspecified severe protein-calorie malnutrition: Secondary | ICD-10-CM | POA: Diagnosis not present

## 2023-11-28 DIAGNOSIS — I82411 Acute embolism and thrombosis of right femoral vein: Secondary | ICD-10-CM | POA: Diagnosis not present

## 2023-11-28 DIAGNOSIS — E785 Hyperlipidemia, unspecified: Secondary | ICD-10-CM | POA: Diagnosis not present

## 2023-11-28 DIAGNOSIS — I2510 Atherosclerotic heart disease of native coronary artery without angina pectoris: Secondary | ICD-10-CM | POA: Diagnosis not present

## 2023-11-28 DIAGNOSIS — Z7982 Long term (current) use of aspirin: Secondary | ICD-10-CM | POA: Diagnosis not present

## 2023-11-28 DIAGNOSIS — Z955 Presence of coronary angioplasty implant and graft: Secondary | ICD-10-CM | POA: Diagnosis not present

## 2023-11-28 DIAGNOSIS — Z87440 Personal history of urinary (tract) infections: Secondary | ICD-10-CM | POA: Diagnosis not present

## 2023-11-28 DIAGNOSIS — H409 Unspecified glaucoma: Secondary | ICD-10-CM | POA: Diagnosis not present

## 2023-11-28 DIAGNOSIS — N1832 Chronic kidney disease, stage 3b: Secondary | ICD-10-CM | POA: Diagnosis not present

## 2023-11-28 DIAGNOSIS — I700 Atherosclerosis of aorta: Secondary | ICD-10-CM | POA: Diagnosis not present

## 2023-11-28 DIAGNOSIS — I82451 Acute embolism and thrombosis of right peroneal vein: Secondary | ICD-10-CM | POA: Diagnosis not present

## 2023-11-28 DIAGNOSIS — K5730 Diverticulosis of large intestine without perforation or abscess without bleeding: Secondary | ICD-10-CM | POA: Diagnosis not present

## 2023-11-28 DIAGNOSIS — L89324 Pressure ulcer of left buttock, stage 4: Secondary | ICD-10-CM | POA: Diagnosis not present

## 2023-11-28 DIAGNOSIS — Z85828 Personal history of other malignant neoplasm of skin: Secondary | ICD-10-CM | POA: Diagnosis not present

## 2023-11-28 DIAGNOSIS — Z9181 History of falling: Secondary | ICD-10-CM | POA: Diagnosis not present

## 2023-11-28 DIAGNOSIS — E1122 Type 2 diabetes mellitus with diabetic chronic kidney disease: Secondary | ICD-10-CM | POA: Diagnosis not present

## 2023-11-30 DIAGNOSIS — L89610 Pressure ulcer of right heel, unstageable: Secondary | ICD-10-CM | POA: Diagnosis not present

## 2023-11-30 DIAGNOSIS — L89154 Pressure ulcer of sacral region, stage 4: Secondary | ICD-10-CM | POA: Diagnosis not present

## 2023-12-06 DIAGNOSIS — I82451 Acute embolism and thrombosis of right peroneal vein: Secondary | ICD-10-CM | POA: Diagnosis not present

## 2023-12-06 DIAGNOSIS — G309 Alzheimer's disease, unspecified: Secondary | ICD-10-CM | POA: Diagnosis not present

## 2023-12-06 DIAGNOSIS — Z7982 Long term (current) use of aspirin: Secondary | ICD-10-CM | POA: Diagnosis not present

## 2023-12-06 DIAGNOSIS — Z955 Presence of coronary angioplasty implant and graft: Secondary | ICD-10-CM | POA: Diagnosis not present

## 2023-12-06 DIAGNOSIS — I2510 Atherosclerotic heart disease of native coronary artery without angina pectoris: Secondary | ICD-10-CM | POA: Diagnosis not present

## 2023-12-06 DIAGNOSIS — H409 Unspecified glaucoma: Secondary | ICD-10-CM | POA: Diagnosis not present

## 2023-12-06 DIAGNOSIS — Z87440 Personal history of urinary (tract) infections: Secondary | ICD-10-CM | POA: Diagnosis not present

## 2023-12-06 DIAGNOSIS — K5730 Diverticulosis of large intestine without perforation or abscess without bleeding: Secondary | ICD-10-CM | POA: Diagnosis not present

## 2023-12-06 DIAGNOSIS — E43 Unspecified severe protein-calorie malnutrition: Secondary | ICD-10-CM | POA: Diagnosis not present

## 2023-12-06 DIAGNOSIS — Z9181 History of falling: Secondary | ICD-10-CM | POA: Diagnosis not present

## 2023-12-06 DIAGNOSIS — I700 Atherosclerosis of aorta: Secondary | ICD-10-CM | POA: Diagnosis not present

## 2023-12-06 DIAGNOSIS — M810 Age-related osteoporosis without current pathological fracture: Secondary | ICD-10-CM | POA: Diagnosis not present

## 2023-12-06 DIAGNOSIS — I82411 Acute embolism and thrombosis of right femoral vein: Secondary | ICD-10-CM | POA: Diagnosis not present

## 2023-12-06 DIAGNOSIS — E785 Hyperlipidemia, unspecified: Secondary | ICD-10-CM | POA: Diagnosis not present

## 2023-12-06 DIAGNOSIS — Z85828 Personal history of other malignant neoplasm of skin: Secondary | ICD-10-CM | POA: Diagnosis not present

## 2023-12-06 DIAGNOSIS — I129 Hypertensive chronic kidney disease with stage 1 through stage 4 chronic kidney disease, or unspecified chronic kidney disease: Secondary | ICD-10-CM | POA: Diagnosis not present

## 2023-12-06 DIAGNOSIS — E1122 Type 2 diabetes mellitus with diabetic chronic kidney disease: Secondary | ICD-10-CM | POA: Diagnosis not present

## 2023-12-06 DIAGNOSIS — L89324 Pressure ulcer of left buttock, stage 4: Secondary | ICD-10-CM | POA: Diagnosis not present

## 2023-12-06 DIAGNOSIS — N1832 Chronic kidney disease, stage 3b: Secondary | ICD-10-CM | POA: Diagnosis not present

## 2023-12-07 ENCOUNTER — Encounter (HOSPITAL_BASED_OUTPATIENT_CLINIC_OR_DEPARTMENT_OTHER): Attending: Internal Medicine | Admitting: Internal Medicine

## 2023-12-07 DIAGNOSIS — F0390 Unspecified dementia without behavioral disturbance: Secondary | ICD-10-CM | POA: Insufficient documentation

## 2023-12-07 DIAGNOSIS — L89154 Pressure ulcer of sacral region, stage 4: Secondary | ICD-10-CM | POA: Diagnosis not present

## 2023-12-10 DIAGNOSIS — Z9181 History of falling: Secondary | ICD-10-CM | POA: Diagnosis not present

## 2023-12-10 DIAGNOSIS — M810 Age-related osteoporosis without current pathological fracture: Secondary | ICD-10-CM | POA: Diagnosis not present

## 2023-12-10 DIAGNOSIS — I82451 Acute embolism and thrombosis of right peroneal vein: Secondary | ICD-10-CM | POA: Diagnosis not present

## 2023-12-10 DIAGNOSIS — I82411 Acute embolism and thrombosis of right femoral vein: Secondary | ICD-10-CM | POA: Diagnosis not present

## 2023-12-10 DIAGNOSIS — I129 Hypertensive chronic kidney disease with stage 1 through stage 4 chronic kidney disease, or unspecified chronic kidney disease: Secondary | ICD-10-CM | POA: Diagnosis not present

## 2023-12-10 DIAGNOSIS — Z87440 Personal history of urinary (tract) infections: Secondary | ICD-10-CM | POA: Diagnosis not present

## 2023-12-10 DIAGNOSIS — I2510 Atherosclerotic heart disease of native coronary artery without angina pectoris: Secondary | ICD-10-CM | POA: Diagnosis not present

## 2023-12-10 DIAGNOSIS — E1122 Type 2 diabetes mellitus with diabetic chronic kidney disease: Secondary | ICD-10-CM | POA: Diagnosis not present

## 2023-12-10 DIAGNOSIS — N1832 Chronic kidney disease, stage 3b: Secondary | ICD-10-CM | POA: Diagnosis not present

## 2023-12-10 DIAGNOSIS — G309 Alzheimer's disease, unspecified: Secondary | ICD-10-CM | POA: Diagnosis not present

## 2023-12-10 DIAGNOSIS — K5730 Diverticulosis of large intestine without perforation or abscess without bleeding: Secondary | ICD-10-CM | POA: Diagnosis not present

## 2023-12-10 DIAGNOSIS — E785 Hyperlipidemia, unspecified: Secondary | ICD-10-CM | POA: Diagnosis not present

## 2023-12-10 DIAGNOSIS — Z955 Presence of coronary angioplasty implant and graft: Secondary | ICD-10-CM | POA: Diagnosis not present

## 2023-12-10 DIAGNOSIS — H409 Unspecified glaucoma: Secondary | ICD-10-CM | POA: Diagnosis not present

## 2023-12-10 DIAGNOSIS — I700 Atherosclerosis of aorta: Secondary | ICD-10-CM | POA: Diagnosis not present

## 2023-12-10 DIAGNOSIS — Z7982 Long term (current) use of aspirin: Secondary | ICD-10-CM | POA: Diagnosis not present

## 2023-12-10 DIAGNOSIS — E43 Unspecified severe protein-calorie malnutrition: Secondary | ICD-10-CM | POA: Diagnosis not present

## 2023-12-10 DIAGNOSIS — Z85828 Personal history of other malignant neoplasm of skin: Secondary | ICD-10-CM | POA: Diagnosis not present

## 2023-12-10 DIAGNOSIS — L89324 Pressure ulcer of left buttock, stage 4: Secondary | ICD-10-CM | POA: Diagnosis not present

## 2023-12-14 DIAGNOSIS — E118 Type 2 diabetes mellitus with unspecified complications: Secondary | ICD-10-CM | POA: Diagnosis not present

## 2023-12-14 DIAGNOSIS — Z7982 Long term (current) use of aspirin: Secondary | ICD-10-CM | POA: Diagnosis not present

## 2023-12-14 DIAGNOSIS — N1832 Chronic kidney disease, stage 3b: Secondary | ICD-10-CM | POA: Diagnosis not present

## 2023-12-14 DIAGNOSIS — Z9181 History of falling: Secondary | ICD-10-CM | POA: Diagnosis not present

## 2023-12-14 DIAGNOSIS — Z87440 Personal history of urinary (tract) infections: Secondary | ICD-10-CM | POA: Diagnosis not present

## 2023-12-14 DIAGNOSIS — Z9189 Other specified personal risk factors, not elsewhere classified: Secondary | ICD-10-CM | POA: Diagnosis not present

## 2023-12-14 DIAGNOSIS — I82401 Acute embolism and thrombosis of unspecified deep veins of right lower extremity: Secondary | ICD-10-CM | POA: Diagnosis not present

## 2023-12-14 DIAGNOSIS — Z993 Dependence on wheelchair: Secondary | ICD-10-CM | POA: Diagnosis not present

## 2023-12-14 DIAGNOSIS — R809 Proteinuria, unspecified: Secondary | ICD-10-CM | POA: Diagnosis not present

## 2023-12-14 DIAGNOSIS — K5730 Diverticulosis of large intestine without perforation or abscess without bleeding: Secondary | ICD-10-CM | POA: Diagnosis not present

## 2023-12-14 DIAGNOSIS — I129 Hypertensive chronic kidney disease with stage 1 through stage 4 chronic kidney disease, or unspecified chronic kidney disease: Secondary | ICD-10-CM | POA: Diagnosis not present

## 2023-12-14 DIAGNOSIS — E43 Unspecified severe protein-calorie malnutrition: Secondary | ICD-10-CM | POA: Diagnosis not present

## 2023-12-14 DIAGNOSIS — E1122 Type 2 diabetes mellitus with diabetic chronic kidney disease: Secondary | ICD-10-CM | POA: Diagnosis not present

## 2023-12-14 DIAGNOSIS — H6122 Impacted cerumen, left ear: Secondary | ICD-10-CM | POA: Diagnosis not present

## 2023-12-14 DIAGNOSIS — I700 Atherosclerosis of aorta: Secondary | ICD-10-CM | POA: Diagnosis not present

## 2023-12-14 DIAGNOSIS — H409 Unspecified glaucoma: Secondary | ICD-10-CM | POA: Diagnosis not present

## 2023-12-14 DIAGNOSIS — G309 Alzheimer's disease, unspecified: Secondary | ICD-10-CM | POA: Diagnosis not present

## 2023-12-14 DIAGNOSIS — I10 Essential (primary) hypertension: Secondary | ICD-10-CM | POA: Diagnosis not present

## 2023-12-14 DIAGNOSIS — M810 Age-related osteoporosis without current pathological fracture: Secondary | ICD-10-CM | POA: Diagnosis not present

## 2023-12-14 DIAGNOSIS — E782 Mixed hyperlipidemia: Secondary | ICD-10-CM | POA: Diagnosis not present

## 2023-12-14 DIAGNOSIS — I2510 Atherosclerotic heart disease of native coronary artery without angina pectoris: Secondary | ICD-10-CM | POA: Diagnosis not present

## 2023-12-14 DIAGNOSIS — Z85828 Personal history of other malignant neoplasm of skin: Secondary | ICD-10-CM | POA: Diagnosis not present

## 2023-12-14 DIAGNOSIS — E785 Hyperlipidemia, unspecified: Secondary | ICD-10-CM | POA: Diagnosis not present

## 2023-12-14 DIAGNOSIS — Z955 Presence of coronary angioplasty implant and graft: Secondary | ICD-10-CM | POA: Diagnosis not present

## 2023-12-14 DIAGNOSIS — I82411 Acute embolism and thrombosis of right femoral vein: Secondary | ICD-10-CM | POA: Diagnosis not present

## 2023-12-14 DIAGNOSIS — E11621 Type 2 diabetes mellitus with foot ulcer: Secondary | ICD-10-CM | POA: Diagnosis not present

## 2023-12-14 DIAGNOSIS — I82451 Acute embolism and thrombosis of right peroneal vein: Secondary | ICD-10-CM | POA: Diagnosis not present

## 2023-12-14 DIAGNOSIS — L89324 Pressure ulcer of left buttock, stage 4: Secondary | ICD-10-CM | POA: Diagnosis not present

## 2023-12-21 ENCOUNTER — Ambulatory Visit (HOSPITAL_BASED_OUTPATIENT_CLINIC_OR_DEPARTMENT_OTHER): Admitting: Internal Medicine

## 2023-12-27 DIAGNOSIS — Z955 Presence of coronary angioplasty implant and graft: Secondary | ICD-10-CM | POA: Diagnosis not present

## 2023-12-27 DIAGNOSIS — Z9181 History of falling: Secondary | ICD-10-CM | POA: Diagnosis not present

## 2023-12-27 DIAGNOSIS — E119 Type 2 diabetes mellitus without complications: Secondary | ICD-10-CM | POA: Diagnosis not present

## 2023-12-27 DIAGNOSIS — Z7984 Long term (current) use of oral hypoglycemic drugs: Secondary | ICD-10-CM | POA: Diagnosis not present

## 2023-12-27 DIAGNOSIS — Z7982 Long term (current) use of aspirin: Secondary | ICD-10-CM | POA: Diagnosis not present

## 2023-12-27 DIAGNOSIS — E782 Mixed hyperlipidemia: Secondary | ICD-10-CM | POA: Diagnosis not present

## 2023-12-27 DIAGNOSIS — H409 Unspecified glaucoma: Secondary | ICD-10-CM | POA: Diagnosis not present

## 2023-12-27 DIAGNOSIS — Z85828 Personal history of other malignant neoplasm of skin: Secondary | ICD-10-CM | POA: Diagnosis not present

## 2023-12-27 DIAGNOSIS — M810 Age-related osteoporosis without current pathological fracture: Secondary | ICD-10-CM | POA: Diagnosis not present

## 2023-12-27 DIAGNOSIS — Z87440 Personal history of urinary (tract) infections: Secondary | ICD-10-CM | POA: Diagnosis not present

## 2023-12-27 DIAGNOSIS — I6521 Occlusion and stenosis of right carotid artery: Secondary | ICD-10-CM | POA: Diagnosis not present

## 2023-12-27 DIAGNOSIS — I2510 Atherosclerotic heart disease of native coronary artery without angina pectoris: Secondary | ICD-10-CM | POA: Diagnosis not present

## 2023-12-27 DIAGNOSIS — I10 Essential (primary) hypertension: Secondary | ICD-10-CM | POA: Diagnosis not present

## 2023-12-27 DIAGNOSIS — Z86718 Personal history of other venous thrombosis and embolism: Secondary | ICD-10-CM | POA: Diagnosis not present

## 2023-12-31 DIAGNOSIS — H409 Unspecified glaucoma: Secondary | ICD-10-CM | POA: Diagnosis not present

## 2023-12-31 DIAGNOSIS — I6521 Occlusion and stenosis of right carotid artery: Secondary | ICD-10-CM | POA: Diagnosis not present

## 2023-12-31 DIAGNOSIS — Z85828 Personal history of other malignant neoplasm of skin: Secondary | ICD-10-CM | POA: Diagnosis not present

## 2023-12-31 DIAGNOSIS — Z87440 Personal history of urinary (tract) infections: Secondary | ICD-10-CM | POA: Diagnosis not present

## 2023-12-31 DIAGNOSIS — Z7984 Long term (current) use of oral hypoglycemic drugs: Secondary | ICD-10-CM | POA: Diagnosis not present

## 2023-12-31 DIAGNOSIS — Z955 Presence of coronary angioplasty implant and graft: Secondary | ICD-10-CM | POA: Diagnosis not present

## 2023-12-31 DIAGNOSIS — Z86718 Personal history of other venous thrombosis and embolism: Secondary | ICD-10-CM | POA: Diagnosis not present

## 2023-12-31 DIAGNOSIS — E782 Mixed hyperlipidemia: Secondary | ICD-10-CM | POA: Diagnosis not present

## 2023-12-31 DIAGNOSIS — E119 Type 2 diabetes mellitus without complications: Secondary | ICD-10-CM | POA: Diagnosis not present

## 2023-12-31 DIAGNOSIS — I10 Essential (primary) hypertension: Secondary | ICD-10-CM | POA: Diagnosis not present

## 2023-12-31 DIAGNOSIS — I2510 Atherosclerotic heart disease of native coronary artery without angina pectoris: Secondary | ICD-10-CM | POA: Diagnosis not present

## 2023-12-31 DIAGNOSIS — Z9181 History of falling: Secondary | ICD-10-CM | POA: Diagnosis not present

## 2023-12-31 DIAGNOSIS — Z7982 Long term (current) use of aspirin: Secondary | ICD-10-CM | POA: Diagnosis not present

## 2023-12-31 DIAGNOSIS — M810 Age-related osteoporosis without current pathological fracture: Secondary | ICD-10-CM | POA: Diagnosis not present

## 2024-01-02 DIAGNOSIS — M810 Age-related osteoporosis without current pathological fracture: Secondary | ICD-10-CM | POA: Diagnosis not present

## 2024-01-02 DIAGNOSIS — I10 Essential (primary) hypertension: Secondary | ICD-10-CM | POA: Diagnosis not present

## 2024-01-02 DIAGNOSIS — Z85828 Personal history of other malignant neoplasm of skin: Secondary | ICD-10-CM | POA: Diagnosis not present

## 2024-01-02 DIAGNOSIS — E782 Mixed hyperlipidemia: Secondary | ICD-10-CM | POA: Diagnosis not present

## 2024-01-02 DIAGNOSIS — E119 Type 2 diabetes mellitus without complications: Secondary | ICD-10-CM | POA: Diagnosis not present

## 2024-01-02 DIAGNOSIS — Z7984 Long term (current) use of oral hypoglycemic drugs: Secondary | ICD-10-CM | POA: Diagnosis not present

## 2024-01-02 DIAGNOSIS — Z7982 Long term (current) use of aspirin: Secondary | ICD-10-CM | POA: Diagnosis not present

## 2024-01-02 DIAGNOSIS — Z86718 Personal history of other venous thrombosis and embolism: Secondary | ICD-10-CM | POA: Diagnosis not present

## 2024-01-02 DIAGNOSIS — Z955 Presence of coronary angioplasty implant and graft: Secondary | ICD-10-CM | POA: Diagnosis not present

## 2024-01-02 DIAGNOSIS — Z9181 History of falling: Secondary | ICD-10-CM | POA: Diagnosis not present

## 2024-01-02 DIAGNOSIS — Z87440 Personal history of urinary (tract) infections: Secondary | ICD-10-CM | POA: Diagnosis not present

## 2024-01-02 DIAGNOSIS — H409 Unspecified glaucoma: Secondary | ICD-10-CM | POA: Diagnosis not present

## 2024-01-02 DIAGNOSIS — I2510 Atherosclerotic heart disease of native coronary artery without angina pectoris: Secondary | ICD-10-CM | POA: Diagnosis not present

## 2024-01-02 DIAGNOSIS — I6521 Occlusion and stenosis of right carotid artery: Secondary | ICD-10-CM | POA: Diagnosis not present

## 2024-01-11 DIAGNOSIS — N390 Urinary tract infection, site not specified: Secondary | ICD-10-CM | POA: Diagnosis not present

## 2024-01-11 DIAGNOSIS — I10 Essential (primary) hypertension: Secondary | ICD-10-CM | POA: Diagnosis not present

## 2024-01-18 DIAGNOSIS — I2510 Atherosclerotic heart disease of native coronary artery without angina pectoris: Secondary | ICD-10-CM | POA: Diagnosis not present

## 2024-01-18 DIAGNOSIS — Z7982 Long term (current) use of aspirin: Secondary | ICD-10-CM | POA: Diagnosis not present

## 2024-01-18 DIAGNOSIS — Z86718 Personal history of other venous thrombosis and embolism: Secondary | ICD-10-CM | POA: Diagnosis not present

## 2024-01-18 DIAGNOSIS — Z85828 Personal history of other malignant neoplasm of skin: Secondary | ICD-10-CM | POA: Diagnosis not present

## 2024-01-18 DIAGNOSIS — H409 Unspecified glaucoma: Secondary | ICD-10-CM | POA: Diagnosis not present

## 2024-01-18 DIAGNOSIS — E782 Mixed hyperlipidemia: Secondary | ICD-10-CM | POA: Diagnosis not present

## 2024-01-18 DIAGNOSIS — Z87440 Personal history of urinary (tract) infections: Secondary | ICD-10-CM | POA: Diagnosis not present

## 2024-01-18 DIAGNOSIS — I6521 Occlusion and stenosis of right carotid artery: Secondary | ICD-10-CM | POA: Diagnosis not present

## 2024-01-18 DIAGNOSIS — E119 Type 2 diabetes mellitus without complications: Secondary | ICD-10-CM | POA: Diagnosis not present

## 2024-01-18 DIAGNOSIS — Z955 Presence of coronary angioplasty implant and graft: Secondary | ICD-10-CM | POA: Diagnosis not present

## 2024-01-18 DIAGNOSIS — Z9181 History of falling: Secondary | ICD-10-CM | POA: Diagnosis not present

## 2024-01-18 DIAGNOSIS — I10 Essential (primary) hypertension: Secondary | ICD-10-CM | POA: Diagnosis not present

## 2024-01-18 DIAGNOSIS — M810 Age-related osteoporosis without current pathological fracture: Secondary | ICD-10-CM | POA: Diagnosis not present

## 2024-01-18 DIAGNOSIS — Z7984 Long term (current) use of oral hypoglycemic drugs: Secondary | ICD-10-CM | POA: Diagnosis not present

## 2024-01-21 DIAGNOSIS — I10 Essential (primary) hypertension: Secondary | ICD-10-CM | POA: Diagnosis not present

## 2024-01-21 DIAGNOSIS — E119 Type 2 diabetes mellitus without complications: Secondary | ICD-10-CM | POA: Diagnosis not present

## 2024-01-21 DIAGNOSIS — Z7982 Long term (current) use of aspirin: Secondary | ICD-10-CM | POA: Diagnosis not present

## 2024-01-21 DIAGNOSIS — Z9181 History of falling: Secondary | ICD-10-CM | POA: Diagnosis not present

## 2024-01-21 DIAGNOSIS — Z955 Presence of coronary angioplasty implant and graft: Secondary | ICD-10-CM | POA: Diagnosis not present

## 2024-01-21 DIAGNOSIS — E782 Mixed hyperlipidemia: Secondary | ICD-10-CM | POA: Diagnosis not present

## 2024-01-21 DIAGNOSIS — Z7984 Long term (current) use of oral hypoglycemic drugs: Secondary | ICD-10-CM | POA: Diagnosis not present

## 2024-01-21 DIAGNOSIS — Z86718 Personal history of other venous thrombosis and embolism: Secondary | ICD-10-CM | POA: Diagnosis not present

## 2024-01-21 DIAGNOSIS — M810 Age-related osteoporosis without current pathological fracture: Secondary | ICD-10-CM | POA: Diagnosis not present

## 2024-01-21 DIAGNOSIS — I2510 Atherosclerotic heart disease of native coronary artery without angina pectoris: Secondary | ICD-10-CM | POA: Diagnosis not present

## 2024-01-21 DIAGNOSIS — H409 Unspecified glaucoma: Secondary | ICD-10-CM | POA: Diagnosis not present

## 2024-01-21 DIAGNOSIS — Z85828 Personal history of other malignant neoplasm of skin: Secondary | ICD-10-CM | POA: Diagnosis not present

## 2024-01-21 DIAGNOSIS — I6521 Occlusion and stenosis of right carotid artery: Secondary | ICD-10-CM | POA: Diagnosis not present

## 2024-01-25 DIAGNOSIS — Z7982 Long term (current) use of aspirin: Secondary | ICD-10-CM | POA: Diagnosis not present

## 2024-01-25 DIAGNOSIS — Z85828 Personal history of other malignant neoplasm of skin: Secondary | ICD-10-CM | POA: Diagnosis not present

## 2024-01-25 DIAGNOSIS — Z7984 Long term (current) use of oral hypoglycemic drugs: Secondary | ICD-10-CM | POA: Diagnosis not present

## 2024-01-25 DIAGNOSIS — I10 Essential (primary) hypertension: Secondary | ICD-10-CM | POA: Diagnosis not present

## 2024-01-25 DIAGNOSIS — I6521 Occlusion and stenosis of right carotid artery: Secondary | ICD-10-CM | POA: Diagnosis not present

## 2024-01-25 DIAGNOSIS — M810 Age-related osteoporosis without current pathological fracture: Secondary | ICD-10-CM | POA: Diagnosis not present

## 2024-01-25 DIAGNOSIS — Z955 Presence of coronary angioplasty implant and graft: Secondary | ICD-10-CM | POA: Diagnosis not present

## 2024-01-25 DIAGNOSIS — Z86718 Personal history of other venous thrombosis and embolism: Secondary | ICD-10-CM | POA: Diagnosis not present

## 2024-01-25 DIAGNOSIS — E782 Mixed hyperlipidemia: Secondary | ICD-10-CM | POA: Diagnosis not present

## 2024-01-25 DIAGNOSIS — I2510 Atherosclerotic heart disease of native coronary artery without angina pectoris: Secondary | ICD-10-CM | POA: Diagnosis not present

## 2024-01-25 DIAGNOSIS — Z9181 History of falling: Secondary | ICD-10-CM | POA: Diagnosis not present

## 2024-01-25 DIAGNOSIS — H409 Unspecified glaucoma: Secondary | ICD-10-CM | POA: Diagnosis not present

## 2024-01-25 DIAGNOSIS — Z87440 Personal history of urinary (tract) infections: Secondary | ICD-10-CM | POA: Diagnosis not present

## 2024-01-25 DIAGNOSIS — E119 Type 2 diabetes mellitus without complications: Secondary | ICD-10-CM | POA: Diagnosis not present

## 2024-02-06 ENCOUNTER — Emergency Department (HOSPITAL_BASED_OUTPATIENT_CLINIC_OR_DEPARTMENT_OTHER)

## 2024-02-06 ENCOUNTER — Inpatient Hospital Stay (HOSPITAL_BASED_OUTPATIENT_CLINIC_OR_DEPARTMENT_OTHER)
Admission: EM | Admit: 2024-02-06 | Discharge: 2024-02-10 | DRG: 871 | Disposition: A | Discharge status: Home / Self Care | Attending: Internal Medicine | Admitting: Internal Medicine

## 2024-02-06 ENCOUNTER — Other Ambulatory Visit: Payer: Self-pay

## 2024-02-06 DIAGNOSIS — R6521 Severe sepsis with septic shock: Secondary | ICD-10-CM | POA: Diagnosis present

## 2024-02-06 DIAGNOSIS — E871 Hypo-osmolality and hyponatremia: Secondary | ICD-10-CM | POA: Diagnosis present

## 2024-02-06 DIAGNOSIS — J984 Other disorders of lung: Secondary | ICD-10-CM | POA: Diagnosis not present

## 2024-02-06 DIAGNOSIS — E7800 Pure hypercholesterolemia, unspecified: Secondary | ICD-10-CM | POA: Diagnosis not present

## 2024-02-06 DIAGNOSIS — I2510 Atherosclerotic heart disease of native coronary artery without angina pectoris: Secondary | ICD-10-CM | POA: Diagnosis not present

## 2024-02-06 DIAGNOSIS — Z79899 Other long term (current) drug therapy: Secondary | ICD-10-CM

## 2024-02-06 DIAGNOSIS — E872 Acidosis, unspecified: Secondary | ICD-10-CM | POA: Diagnosis present

## 2024-02-06 DIAGNOSIS — Z888 Allergy status to other drugs, medicaments and biological substances status: Secondary | ICD-10-CM

## 2024-02-06 DIAGNOSIS — Z7901 Long term (current) use of anticoagulants: Secondary | ICD-10-CM | POA: Diagnosis not present

## 2024-02-06 DIAGNOSIS — E1165 Type 2 diabetes mellitus with hyperglycemia: Secondary | ICD-10-CM | POA: Diagnosis present

## 2024-02-06 DIAGNOSIS — A419 Sepsis, unspecified organism: Secondary | ICD-10-CM | POA: Diagnosis not present

## 2024-02-06 DIAGNOSIS — Z886 Allergy status to analgesic agent status: Secondary | ICD-10-CM

## 2024-02-06 DIAGNOSIS — Z85828 Personal history of other malignant neoplasm of skin: Secondary | ICD-10-CM | POA: Diagnosis not present

## 2024-02-06 DIAGNOSIS — D649 Anemia, unspecified: Secondary | ICD-10-CM

## 2024-02-06 DIAGNOSIS — Z885 Allergy status to narcotic agent status: Secondary | ICD-10-CM

## 2024-02-06 DIAGNOSIS — Z7401 Bed confinement status: Secondary | ICD-10-CM

## 2024-02-06 DIAGNOSIS — N136 Pyonephrosis: Secondary | ICD-10-CM | POA: Diagnosis not present

## 2024-02-06 DIAGNOSIS — E878 Other disorders of electrolyte and fluid balance, not elsewhere classified: Secondary | ICD-10-CM | POA: Diagnosis not present

## 2024-02-06 DIAGNOSIS — N390 Urinary tract infection, site not specified: Secondary | ICD-10-CM

## 2024-02-06 DIAGNOSIS — Z7982 Long term (current) use of aspirin: Secondary | ICD-10-CM

## 2024-02-06 DIAGNOSIS — E11649 Type 2 diabetes mellitus with hypoglycemia without coma: Secondary | ICD-10-CM | POA: Diagnosis not present

## 2024-02-06 DIAGNOSIS — R000 Tachycardia, unspecified: Secondary | ICD-10-CM | POA: Diagnosis not present

## 2024-02-06 DIAGNOSIS — Z66 Do not resuscitate: Secondary | ICD-10-CM | POA: Diagnosis present

## 2024-02-06 DIAGNOSIS — Z8249 Family history of ischemic heart disease and other diseases of the circulatory system: Secondary | ICD-10-CM

## 2024-02-06 DIAGNOSIS — Z1152 Encounter for screening for COVID-19: Secondary | ICD-10-CM

## 2024-02-06 DIAGNOSIS — N3000 Acute cystitis without hematuria: Secondary | ICD-10-CM

## 2024-02-06 DIAGNOSIS — I10 Essential (primary) hypertension: Secondary | ICD-10-CM | POA: Diagnosis not present

## 2024-02-06 DIAGNOSIS — E118 Type 2 diabetes mellitus with unspecified complications: Secondary | ICD-10-CM | POA: Diagnosis present

## 2024-02-06 DIAGNOSIS — J189 Pneumonia, unspecified organism: Secondary | ICD-10-CM | POA: Diagnosis present

## 2024-02-06 DIAGNOSIS — R509 Fever, unspecified: Secondary | ICD-10-CM | POA: Diagnosis not present

## 2024-02-06 DIAGNOSIS — F0390 Unspecified dementia without behavioral disturbance: Secondary | ICD-10-CM | POA: Diagnosis present

## 2024-02-06 DIAGNOSIS — R918 Other nonspecific abnormal finding of lung field: Secondary | ICD-10-CM | POA: Diagnosis not present

## 2024-02-06 DIAGNOSIS — R579 Shock, unspecified: Secondary | ICD-10-CM | POA: Diagnosis present

## 2024-02-06 DIAGNOSIS — Z881 Allergy status to other antibiotic agents status: Secondary | ICD-10-CM | POA: Diagnosis not present

## 2024-02-06 DIAGNOSIS — I700 Atherosclerosis of aorta: Secondary | ICD-10-CM | POA: Diagnosis not present

## 2024-02-06 LAB — COMPREHENSIVE METABOLIC PANEL WITH GFR
ALT: 10 U/L (ref 0–44)
AST: 23 U/L (ref 15–41)
Albumin: 3.2 g/dL — ABNORMAL LOW (ref 3.5–5.0)
Alkaline Phosphatase: 97 U/L (ref 38–126)
Anion gap: 14 (ref 5–15)
BUN: 20 mg/dL (ref 8–23)
CO2: 21 mmol/L — ABNORMAL LOW (ref 22–32)
Calcium: 9.9 mg/dL (ref 8.9–10.3)
Chloride: 93 mmol/L — ABNORMAL LOW (ref 98–111)
Creatinine, Ser: 0.47 mg/dL (ref 0.44–1.00)
GFR, Estimated: >60 mL/min (ref >60)
Glucose, Bld: 214 mg/dL — ABNORMAL HIGH (ref 70–99)
Potassium: 4.7 mmol/L (ref 3.5–5.1)
Sodium: 128 mmol/L — ABNORMAL LOW (ref 135–145)
Total Bilirubin: 0.5 mg/dL (ref 0.0–1.2)
Total Protein: 7.5 g/dL (ref 6.5–8.1)

## 2024-02-06 LAB — RESP PANEL BY RT-PCR (RSV, FLU A&B, COVID)  RVPGX2
Influenza A by PCR: NEGATIVE
Influenza B by PCR: NEGATIVE
Resp Syncytial Virus by PCR: NEGATIVE
SARS Coronavirus 2 by RT PCR: NEGATIVE

## 2024-02-06 LAB — CBC
HCT: 40.1 % (ref 36.0–46.0)
Hemoglobin: 13.2 g/dL (ref 12.0–15.0)
MCH: 27.3 pg (ref 26.0–34.0)
MCHC: 32.9 g/dL (ref 30.0–36.0)
MCV: 82.9 fL (ref 80.0–100.0)
Platelets: 395 K/uL (ref 150–400)
RBC: 4.84 MIL/uL (ref 3.87–5.11)
RDW: 16.5 % — ABNORMAL HIGH (ref 11.5–15.5)
WBC: 8.6 K/uL (ref 4.0–10.5)
nRBC: 0 % (ref 0.0–0.2)

## 2024-02-06 LAB — CBG MONITORING, ED: Glucose-Capillary: 194 mg/dL — ABNORMAL HIGH (ref 70–99)

## 2024-02-06 LAB — PROTIME-INR
INR: 1.3 — ABNORMAL HIGH (ref 0.8–1.2)
Prothrombin Time: 16.5 s — ABNORMAL HIGH (ref 11.4–15.2)

## 2024-02-06 LAB — LIPASE, BLOOD: Lipase: 33 U/L (ref 11–51)

## 2024-02-06 LAB — LACTIC ACID, PLASMA: Lactic Acid, Venous: 2.6 mmol/L (ref 0.5–1.9)

## 2024-02-06 LAB — TROPONIN T, HIGH SENSITIVITY: Troponin T High Sensitivity: 43 ng/L — ABNORMAL HIGH (ref 0–19)

## 2024-02-06 MED ORDER — METRONIDAZOLE 500 MG/100ML IV SOLN
500.0000 mg | Freq: Once | INTRAVENOUS | Status: AC
  Administered 2024-02-06: 500 mg via INTRAVENOUS
  Filled 2024-02-06: qty 100

## 2024-02-06 MED ORDER — LACTATED RINGERS IV SOLN
INTRAVENOUS | Status: AC
Start: 2024-02-06 — End: 2024-02-07

## 2024-02-06 MED ORDER — SODIUM CHLORIDE 0.9 % IV SOLN
2.0000 g | Freq: Once | INTRAVENOUS | Status: AC
  Administered 2024-02-06: 2 g via INTRAVENOUS
  Filled 2024-02-06: qty 12.5

## 2024-02-06 MED ORDER — VANCOMYCIN HCL IN DEXTROSE 1-5 GM/200ML-% IV SOLN
1000.0000 mg | Freq: Once | INTRAVENOUS | Status: AC
  Administered 2024-02-06: 1000 mg via INTRAVENOUS
  Filled 2024-02-06: qty 200

## 2024-02-06 MED ORDER — IOHEXOL 300 MG/ML  SOLN
75.0000 mL | Freq: Once | INTRAMUSCULAR | Status: AC | PRN
  Administered 2024-02-06: 75 mL via INTRAVENOUS

## 2024-02-06 MED ORDER — LACTATED RINGERS IV BOLUS (SEPSIS)
500.0000 mL | Freq: Once | INTRAVENOUS | Status: AC
  Administered 2024-02-06: 500 mL via INTRAVENOUS

## 2024-02-06 MED ORDER — LACTATED RINGERS IV BOLUS (SEPSIS)
1000.0000 mL | Freq: Once | INTRAVENOUS | Status: AC
  Administered 2024-02-06: 1000 mL via INTRAVENOUS

## 2024-02-06 MED ORDER — LACTATED RINGERS IV BOLUS (SEPSIS)
250.0000 mL | Freq: Once | INTRAVENOUS | Status: AC
  Administered 2024-02-07: 250 mL via INTRAVENOUS

## 2024-02-06 MED ORDER — ONDANSETRON HCL 4 MG/2ML IJ SOLN
4.0000 mg | Freq: Once | INTRAMUSCULAR | Status: AC | PRN
  Administered 2024-02-06: 4 mg via INTRAVENOUS
  Filled 2024-02-06: qty 2

## 2024-02-06 NOTE — ED Triage Notes (Signed)
 Pt POV with family reporting vomiting and low BP at home. Manual BP 118/72 in triage. Hx, dementia, nonverbal at baseline.

## 2024-02-06 NOTE — Progress Notes (Signed)
 Elink monitoring for the code sepsis protocol.

## 2024-02-06 NOTE — ED Provider Notes (Signed)
 [place] EMERGENCY DEPARTMENT AT [facility] Provider Note   CSN: [id number] Arrival date & time: 02/06/24  2053     Patient presents with: Emesis   Jennifer Gordon is a 89 y.o. female.  {Add pertinent medical, surgical, social history, OB history to HPI:32947}  Emesis    Patient has a history of diabetes hypertension hypercholesterolemia memory employments, encephalopathy, UTI.  Patient is cared for at home by family.  Patient is nonambulatory.  Family states she rarely speaks.  Patient was noted to have an episode where she was shaking and appeared if she might have been chilled.  However she was covered with a blanket at the time.  Patient did have 1 episode of vomiting.  They tried to check her blood pressure and pulse at home and were having difficulty getting a good reading.  They brought her into the ED for evaluation  Prior to Admission medications   Medication Sig Start Date End Date Taking? Authorizing Provider  acarbose  (PRECOSE ) 25 MG tablet Take 25 mg by mouth every evening.     [provider]  acetaminophen  (TYLENOL ) 500 MG tablet Take 1 tablet (500 mg total) by mouth every 4 (four) hours as needed for moderate pain. 04/22/17   Mesner, Selinda, MD  apixaban  (ELIQUIS ) 5 MG TABS tablet Take 2 tablets (10 mg total) by mouth 2 (two) times daily for 7 days, THEN 1 tablet (5 mg total) 2 (two) times daily for 23 days. 05/14/23 06/13/23  Bryn Bernardino NOVAK, MD  ASPIRIN  LOW DOSE 81 MG EC tablet Take 81 mg by mouth daily. 08/30/21   [provider]  cromolyn  (OPTICROM ) 4 % ophthalmic solution 1 drop 4 (four) times daily. 05/26/21   [provider]  cycloSPORINE  (RESTASIS ) 0.05 % ophthalmic emulsion Place 1 drop into both eyes 2 (two) times daily. Use for dry eyes.    [provider]  denosumab  (PROLIA ) 60 MG/ML SOSY injection Inject 60 mg into the skin every 6 (six) months. September/October was last injection, Caregiver wasn't sure which month.     [provider]  dorzolamide -timolol  (COSOPT ) 22.3-6.8 MG/ML ophthalmic solution Place 1 drop into both eyes 2 (two) times daily. Use for glaucoma    [provider]  Eyelid Cleansers (OCUSOFT LID SCRUB EX) Apply topically 2 (two) times daily. As directed    [provider]  latanoprost  (XALATAN ) 0.005 % ophthalmic solution Place 1 drop into both eyes at bedtime.     [provider]  Multiple Vitamin (MULTIVITAMIN) capsule Take 1 capsule by mouth daily. Patient takes Centrum Silver A-Z    [provider]  polyethylene glycol (MIRALAX ) 17 g packet Take 17 g by mouth daily. 07/16/23   Bero, Michael M, MD  Polyvinyl Alcohol -Povidone PF (REFRESH) 1.4-0.6 % SOLN See admin instructions.    [provider]  rosuvastatin  (CRESTOR ) 10 MG tablet Take 10 mg by mouth daily.    [provider]    Allergies: Atorvastatin, Nsaids, Cephalexin, Codeine, Ibandronate, Lipitor [atorvastatin calcium ], Niaspan [niacin er (antihyperlipidemic)], Nitrofurantoin, Ticlid [ticlopidine hcl], and Zocor [simvastatin - high dose]    Review of Systems  Gastrointestinal:  Positive for vomiting.    Updated Vital Signs BP 118/72   Pulse (!) 101   Temp (!) 100.5 F (38.1 C) (Oral)   Resp (!) 27   SpO2 94%   Physical Exam Vitals and nursing note reviewed.  Constitutional:      Appearance: She is well-developed. She is ill-appearing.  Comments: Elderly frail  HENT:     Head: Normocephalic and atraumatic.     Right Ear: External ear normal.     Left Ear: External ear normal.  Eyes:     General: No scleral icterus.       Right eye: No discharge.        Left eye: No discharge.     Conjunctiva/sclera: Conjunctivae normal.  Neck:     Trachea: No tracheal deviation.  Cardiovascular:     Rate and Rhythm: Normal rate and regular rhythm.  Pulmonary:     Effort: Pulmonary effort is normal. No respiratory distress.     Breath sounds: Normal breath sounds.  No stridor. No wheezing or rales.  Abdominal:     General: Bowel sounds are normal. There is no distension.     Palpations: Abdomen is soft.     Tenderness: There is no abdominal tenderness. There is no guarding or rebound.  Musculoskeletal:        General: No tenderness or deformity.     Cervical back: Neck supple.     Comments: Very small approximately 1 cm decubitus ulcer that is stage II, no signs of surrounding erythema  Skin:    General: Skin is warm and dry.     Coloration: Skin is not jaundiced.     Findings: No rash.  Neurological:     Cranial Nerves: No cranial nerve deficit, dysarthria or facial asymmetry.     Sensory: No sensory deficit.     Motor: No abnormal muscle tone or seizure activity.     Coordination: Coordination normal.     Comments: Patient does look at me when I speak to her, does not answer my questions, does not follow commands showed, per family this is baseline  Psychiatric:        Mood and Affect: Mood normal.     (all labs ordered are listed, but only abnormal results are displayed) Labs Reviewed  COMPREHENSIVE METABOLIC PANEL WITH GFR - Abnormal; Notable for the following components:      Result Value   Sodium 128 (*)    Chloride 93 (*)    CO2 21 (*)    Glucose, Bld 214 (*)    Albumin 3.2 (*)    All other components within normal limits  CBC - Abnormal; Notable for the following components:   RDW 16.5 (*)    All other components within normal limits  LACTIC ACID, PLASMA - Abnormal; Notable for the following components:   Lactic Acid, Venous 2.6 (*)    All other components within normal limits  CBG MONITORING, ED - Abnormal; Notable for the following components:   Glucose-Capillary 194 (*)    All other components within normal limits  TROPONIN T, HIGH SENSITIVITY - Abnormal; Notable for the following components:   Troponin T High Sensitivity 43 (*)    All other components within normal limits  RESP PANEL BY RT-PCR (RSV, FLU A&B, COVID)   RVPGX2  CULTURE, BLOOD (ROUTINE X 2)  CULTURE, BLOOD (ROUTINE X 2)  LIPASE, BLOOD  LACTIC ACID, PLASMA  PROTIME-INR  URINALYSIS, W/ REFLEX TO CULTURE (INFECTION SUSPECTED)    EKG: None  Radiology: No results found.  {Document cardiac monitor, telemetry assessment procedure when appropriate:32947} Procedures   Medications Ordered in the ED  ondansetron  (ZOFRAN ) injection 4 mg (has no administration in time range)  lactated ringers  infusion (has no administration in time range)  ceFEPIme  (MAXIPIME ) 2 g in sodium chloride  0.9 % 100  mL IVPB (has no administration in time range)  metroNIDAZOLE  (FLAGYL ) IVPB 500 mg (has no administration in time range)  vancomycin  (VANCOCIN ) IVPB 1000 mg/200 mL premix (has no administration in time range)  lactated ringers  bolus 1,000 mL (has no administration in time range)    And  lactated ringers  bolus 500 mL (has no administration in time range)    And  lactated ringers  bolus 250 mL (has no administration in time range)    Clinical Course as of 02/06/24 2209  Wed Feb 06, 2024  2207 Patient's blood pressure are noted to be decreased at the bedside.  She does have tachycardia fever.  Presentation concerning for the possibility of evolving sepsis. [JK]  2207 Did confirm CODE STATUS with family she would not want CPR intubation [JK]    Clinical Course User Index [JK] Randol Simmonds, MD   {Click here for ABCD2, HEART and other calculators REFRESH Note before signing:1}                              Medical Decision Making Amount and/or Complexity of Data Reviewed Labs: ordered. Radiology: ordered.  Risk Prescription drug management.   ***  {Document critical care time when appropriate  Document review of labs and clinical decision tools ie CHADS2VASC2, etc  Document your independent review of radiology images and any outside records  Document your discussion with family members, caretakers and with consultants  Document social determinants  of health affecting pt's care  Document your decision making why or why not admission, treatments were needed:32947:::1}   Final diagnoses:  None    ED Discharge Orders     None

## 2024-02-07 ENCOUNTER — Encounter (HOSPITAL_COMMUNITY): Payer: Self-pay

## 2024-02-07 DIAGNOSIS — N390 Urinary tract infection, site not specified: Secondary | ICD-10-CM | POA: Diagnosis present

## 2024-02-07 DIAGNOSIS — E8729 Other acidosis: Secondary | ICD-10-CM

## 2024-02-07 DIAGNOSIS — Z886 Allergy status to analgesic agent status: Secondary | ICD-10-CM | POA: Diagnosis not present

## 2024-02-07 DIAGNOSIS — I2510 Atherosclerotic heart disease of native coronary artery without angina pectoris: Secondary | ICD-10-CM | POA: Diagnosis present

## 2024-02-07 DIAGNOSIS — E878 Other disorders of electrolyte and fluid balance, not elsewhere classified: Secondary | ICD-10-CM

## 2024-02-07 DIAGNOSIS — E11649 Type 2 diabetes mellitus with hypoglycemia without coma: Secondary | ICD-10-CM | POA: Diagnosis not present

## 2024-02-07 DIAGNOSIS — K920 Hematemesis: Secondary | ICD-10-CM | POA: Diagnosis not present

## 2024-02-07 DIAGNOSIS — A419 Sepsis, unspecified organism: Secondary | ICD-10-CM | POA: Diagnosis not present

## 2024-02-07 DIAGNOSIS — R579 Shock, unspecified: Secondary | ICD-10-CM | POA: Diagnosis not present

## 2024-02-07 DIAGNOSIS — I10 Essential (primary) hypertension: Secondary | ICD-10-CM | POA: Diagnosis present

## 2024-02-07 DIAGNOSIS — Z881 Allergy status to other antibiotic agents status: Secondary | ICD-10-CM | POA: Diagnosis not present

## 2024-02-07 DIAGNOSIS — Z743 Need for continuous supervision: Secondary | ICD-10-CM | POA: Diagnosis not present

## 2024-02-07 DIAGNOSIS — Z1152 Encounter for screening for COVID-19: Secondary | ICD-10-CM | POA: Diagnosis not present

## 2024-02-07 DIAGNOSIS — Z7982 Long term (current) use of aspirin: Secondary | ICD-10-CM | POA: Diagnosis not present

## 2024-02-07 DIAGNOSIS — K5730 Diverticulosis of large intestine without perforation or abscess without bleeding: Secondary | ICD-10-CM | POA: Diagnosis not present

## 2024-02-07 DIAGNOSIS — R6521 Severe sepsis with septic shock: Secondary | ICD-10-CM | POA: Diagnosis not present

## 2024-02-07 DIAGNOSIS — E1165 Type 2 diabetes mellitus with hyperglycemia: Secondary | ICD-10-CM

## 2024-02-07 DIAGNOSIS — Z7401 Bed confinement status: Secondary | ICD-10-CM | POA: Diagnosis not present

## 2024-02-07 DIAGNOSIS — I959 Hypotension, unspecified: Secondary | ICD-10-CM | POA: Diagnosis not present

## 2024-02-07 DIAGNOSIS — F0390 Unspecified dementia without behavioral disturbance: Secondary | ICD-10-CM | POA: Diagnosis present

## 2024-02-07 DIAGNOSIS — D649 Anemia, unspecified: Secondary | ICD-10-CM | POA: Diagnosis present

## 2024-02-07 DIAGNOSIS — Z79899 Other long term (current) drug therapy: Secondary | ICD-10-CM | POA: Diagnosis not present

## 2024-02-07 DIAGNOSIS — N281 Cyst of kidney, acquired: Secondary | ICD-10-CM | POA: Diagnosis not present

## 2024-02-07 DIAGNOSIS — Z8249 Family history of ischemic heart disease and other diseases of the circulatory system: Secondary | ICD-10-CM | POA: Diagnosis not present

## 2024-02-07 DIAGNOSIS — J189 Pneumonia, unspecified organism: Secondary | ICD-10-CM | POA: Diagnosis present

## 2024-02-07 DIAGNOSIS — Z66 Do not resuscitate: Secondary | ICD-10-CM | POA: Diagnosis not present

## 2024-02-07 DIAGNOSIS — E871 Hypo-osmolality and hyponatremia: Secondary | ICD-10-CM | POA: Diagnosis not present

## 2024-02-07 DIAGNOSIS — Z885 Allergy status to narcotic agent status: Secondary | ICD-10-CM | POA: Diagnosis not present

## 2024-02-07 DIAGNOSIS — N3000 Acute cystitis without hematuria: Secondary | ICD-10-CM | POA: Diagnosis not present

## 2024-02-07 DIAGNOSIS — N136 Pyonephrosis: Secondary | ICD-10-CM | POA: Diagnosis present

## 2024-02-07 DIAGNOSIS — N1330 Unspecified hydronephrosis: Secondary | ICD-10-CM | POA: Diagnosis not present

## 2024-02-07 DIAGNOSIS — E872 Acidosis, unspecified: Secondary | ICD-10-CM | POA: Diagnosis present

## 2024-02-07 DIAGNOSIS — E7800 Pure hypercholesterolemia, unspecified: Secondary | ICD-10-CM | POA: Diagnosis present

## 2024-02-07 DIAGNOSIS — Z85828 Personal history of other malignant neoplasm of skin: Secondary | ICD-10-CM | POA: Diagnosis not present

## 2024-02-07 DIAGNOSIS — Z7901 Long term (current) use of anticoagulants: Secondary | ICD-10-CM | POA: Diagnosis not present

## 2024-02-07 LAB — URINALYSIS, W/ REFLEX TO CULTURE (INFECTION SUSPECTED)
Bacteria, UA: NONE SEEN
Bilirubin Urine: NEGATIVE
Glucose, UA: NEGATIVE mg/dL
Ketones, ur: NEGATIVE mg/dL
Leukocytes,Ua: 500 — AB
Nitrite: NEGATIVE
Specific Gravity, Urine: 1.026 (ref 1.005–1.030)
WBC, UA: >50 WBC/hpf (ref 0–5)
pH: 5.5 (ref 5.0–8.0)

## 2024-02-07 LAB — CBC
HCT: 25.1 % — ABNORMAL LOW (ref 36.0–46.0)
Hemoglobin: 8 g/dL — ABNORMAL LOW (ref 12.0–15.0)
MCH: 27.7 pg (ref 26.0–34.0)
MCHC: 31.9 g/dL (ref 30.0–36.0)
MCV: 86.9 fL (ref 80.0–100.0)
Platelets: 269 K/uL (ref 150–400)
RBC: 2.89 MIL/uL — ABNORMAL LOW (ref 3.87–5.11)
RDW: 16.4 % — ABNORMAL HIGH (ref 11.5–15.5)
WBC: 16.4 K/uL — ABNORMAL HIGH (ref 4.0–10.5)
nRBC: 0 % (ref 0.0–0.2)

## 2024-02-07 LAB — URINALYSIS, ROUTINE W REFLEX MICROSCOPIC
Bacteria, UA: NONE SEEN
Bilirubin Urine: NEGATIVE
Glucose, UA: 50 mg/dL — AB
Hgb urine dipstick: NEGATIVE
Ketones, ur: 5 mg/dL — AB
Nitrite: NEGATIVE
Protein, ur: 100 mg/dL — AB
Specific Gravity, Urine: 1.028 (ref 1.005–1.030)
pH: 6 (ref 5.0–8.0)

## 2024-02-07 LAB — LACTIC ACID, PLASMA: Lactic Acid, Venous: 2.5 mmol/L (ref 0.5–1.9)

## 2024-02-07 LAB — GLUCOSE, CAPILLARY
Glucose-Capillary: 215 mg/dL — ABNORMAL HIGH (ref 70–99)
Glucose-Capillary: 217 mg/dL — ABNORMAL HIGH (ref 70–99)
Glucose-Capillary: 238 mg/dL — ABNORMAL HIGH (ref 70–99)
Glucose-Capillary: 259 mg/dL — ABNORMAL HIGH (ref 70–99)
Glucose-Capillary: 285 mg/dL — ABNORMAL HIGH (ref 70–99)

## 2024-02-07 LAB — MAGNESIUM: Magnesium: 0.8 mg/dL — CL (ref 1.7–2.4)

## 2024-02-07 LAB — CREATININE, SERUM
Creatinine, Ser: 0.31 mg/dL — ABNORMAL LOW (ref 0.44–1.00)
GFR, Estimated: >60 mL/min (ref >60)

## 2024-02-07 LAB — HEMOGLOBIN A1C
Hgb A1c MFr Bld: 7.4 % — ABNORMAL HIGH (ref 4.8–5.6)
Mean Plasma Glucose: 165.68 mg/dL

## 2024-02-07 LAB — MRSA NEXT GEN BY PCR, NASAL: MRSA by PCR Next Gen: NOT DETECTED

## 2024-02-07 LAB — CORTISOL: Cortisol, Plasma: 16.1 ug/dL

## 2024-02-07 LAB — CBG MONITORING, ED: Glucose-Capillary: 220 mg/dL — ABNORMAL HIGH (ref 70–99)

## 2024-02-07 MED ORDER — SODIUM CHLORIDE 0.9 % IV SOLN
500.0000 mg | INTRAVENOUS | Status: AC
  Administered 2024-02-07 – 2024-02-09 (×3): 500 mg via INTRAVENOUS
  Filled 2024-02-07 (×3): qty 5

## 2024-02-07 MED ORDER — MIDODRINE HCL 5 MG PO TABS
10.0000 mg | ORAL_TABLET | Freq: Two times a day (BID) | ORAL | Status: DC
  Administered 2024-02-08 – 2024-02-10 (×5): 10 mg via ORAL
  Filled 2024-02-07 (×5): qty 2

## 2024-02-07 MED ORDER — DOCUSATE SODIUM 100 MG PO CAPS: 100.0000 mg | ORAL_CAPSULE | Freq: Two times a day (BID) | ORAL | Status: DC | PRN

## 2024-02-07 MED ORDER — MAGNESIUM SULFATE 4 GM/100ML IV SOLN
4.0000 g | Freq: Once | INTRAVENOUS | Status: AC
  Administered 2024-02-07: 4 g via INTRAVENOUS
  Filled 2024-02-07: qty 100

## 2024-02-07 MED ORDER — LACTATED RINGERS IV BOLUS
500.0000 mL | Freq: Once | INTRAVENOUS | Status: AC
  Administered 2024-02-07: 500 mL via INTRAVENOUS

## 2024-02-07 MED ORDER — INSULIN ASPART 100 UNIT/ML IJ SOLN
0.0000 [IU] | INTRAMUSCULAR | Status: DC
  Administered 2024-02-07 (×2): 5 [IU] via SUBCUTANEOUS
  Administered 2024-02-07: 8 [IU] via SUBCUTANEOUS
  Administered 2024-02-07 (×2): 5 [IU] via SUBCUTANEOUS
  Administered 2024-02-08: 15 [IU] via SUBCUTANEOUS
  Administered 2024-02-08: 5 [IU] via SUBCUTANEOUS
  Administered 2024-02-08: 3 [IU] via SUBCUTANEOUS
  Administered 2024-02-08: 5 [IU] via SUBCUTANEOUS
  Administered 2024-02-09: 8 [IU] via SUBCUTANEOUS
  Administered 2024-02-09 (×2): 3 [IU] via SUBCUTANEOUS

## 2024-02-07 MED ORDER — POLYETHYLENE GLYCOL 3350 17 G PO PACK: 17.0000 g | PACK | Freq: Every day | ORAL | Status: DC | PRN

## 2024-02-07 MED ORDER — LACTATED RINGERS IV BOLUS
1000.0000 mL | Freq: Once | INTRAVENOUS | Status: AC
  Administered 2024-02-07: 1000 mL via INTRAVENOUS

## 2024-02-07 MED ORDER — SODIUM CHLORIDE 0.9 % IV SOLN
1.0000 g | INTRAVENOUS | Status: DC
  Administered 2024-02-07 – 2024-02-10 (×4): 1 g via INTRAVENOUS
  Filled 2024-02-07 (×4): qty 10

## 2024-02-07 MED ORDER — CHLORHEXIDINE GLUCONATE CLOTH 2 % EX PADS
6.0000 | MEDICATED_PAD | Freq: Every day | CUTANEOUS | Status: DC
  Administered 2024-02-07 – 2024-02-08 (×2): 6 via TOPICAL

## 2024-02-07 MED ORDER — SODIUM CHLORIDE 0.9 % IV SOLN: 250.0000 mL | INTRAVENOUS | Status: AC

## 2024-02-07 MED ORDER — HYDROCORTISONE SOD SUC (PF) 100 MG IJ SOLR
100.0000 mg | Freq: Two times a day (BID) | INTRAMUSCULAR | Status: DC
  Administered 2024-02-07 – 2024-02-08 (×2): 100 mg via INTRAVENOUS
  Filled 2024-02-07 (×2): qty 2

## 2024-02-07 MED ORDER — ACETAMINOPHEN 650 MG RE SUPP
650.0000 mg | Freq: Once | RECTAL | Status: AC
  Administered 2024-02-07: 650 mg via RECTAL
  Filled 2024-02-07: qty 1

## 2024-02-07 MED ORDER — DIPHENHYDRAMINE HCL 50 MG/ML IJ SOLN: 12.5000 mg | Freq: Three times a day (TID) | INTRAMUSCULAR | Status: DC | PRN

## 2024-02-07 MED ORDER — HEPARIN SODIUM (PORCINE) 5000 UNIT/ML IJ SOLN
5000.0000 [IU] | Freq: Three times a day (TID) | INTRAMUSCULAR | Status: DC
  Administered 2024-02-07 – 2024-02-10 (×9): 5000 [IU] via SUBCUTANEOUS
  Filled 2024-02-07 (×10): qty 1

## 2024-02-07 MED ORDER — NOREPINEPHRINE 4 MG/250ML-% IV SOLN
0.0000 ug/min | INTRAVENOUS | Status: DC
  Administered 2024-02-07: 12 ug/min via INTRAVENOUS
  Administered 2024-02-07: 9 ug/min via INTRAVENOUS
  Filled 2024-02-07 (×2): qty 250

## 2024-02-07 MED ORDER — HYDROCORTISONE SOD SUC (PF) 100 MG IJ SOLR
100.0000 mg | Freq: Three times a day (TID) | INTRAMUSCULAR | Status: DC
  Administered 2024-02-07: 100 mg via INTRAVENOUS
  Filled 2024-02-07: qty 2

## 2024-02-07 MED ORDER — INSULIN ASPART 100 UNIT/ML IJ SOLN
0.0000 [IU] | INTRAMUSCULAR | Status: DC
  Administered 2024-02-07: 3 [IU] via SUBCUTANEOUS

## 2024-02-07 NOTE — H&P (Signed)
 NAME:  Jennifer Gordon, MRN:  [id number], DOB:  13-Aug-1921, LOS: 0 ADMISSION DATE:  02/06/2024, CONSULTATION DATE:  02/07/24 REFERRING MD:  EDP, CHIEF COMPLAINT:  not feeling well   History of Present Illness:  89 yo female presented with her son after ~2-3 days of not feeling well. Pt was reportedly not consistently eating and drinking and son was concerned she was getting ill. No fever documented at home but reportedly had the chills and rigors. He states that she was otherwise at her baseline, no change in bowel or urinary habits. No n/v/d. She appeared to be improving on Tuesday and Wed in the afternoon she began having worsening chills and he brought pt in.   On arrival she was noted to be hypotensive, without fever, no elevated leukocytes, cx pending, small infiltrate on R lung and other imaging relatively benign for source of infection. Started on abx for community acquired pathogens considering her living at home with her son. She was transferred to ED for evaluation on 16 norepi to maintain BP via peripheral iv.   All history is obtained from son and chart review as pt is minimally verbal and baseline dementia. Just continues to moan that it hurts and please help me as her piv is manipulated for blood and medication infusion.   Pertinent  Medical History  Reported dementia T2dm with hyperglycemia  Significant Hospital Events: Including procedures, antibiotic start and stop dates in addition to other pertinent events   Admitted to ICU 9/11  Interim History / Subjective:    Objective    Blood pressure 110/78, pulse 95, temperature 98.4 F (36.9 C), temperature source Axillary, resp. rate (!) 23, weight 65.8 kg, SpO2 100%.       No intake or output data in the 24 hours ending 02/07/24 0505 Filed Weights   02/07/24 0121  Weight: 65.8 kg    Examination: General: frail elderly woman laying in bed HENT: ncat, poor dentition, mmmp, perrla Lungs: ctab Cardiovascular: irreg  irreg Abdomen: nt/nd bs + Extremities: no c/c/e Neuro: not following commands, calling out it hurts please help me. No focal deficits but certainly slowed movements when she is pulling at lines and gown GU: deferred  Resolved problem list   Assessment and Plan  Shock,  undetermined etiology ? sepsis -no elevated wbc, afebrile, no clear source, urine is + leuks but no nitrites, cxr > small area of infiltrate -titrate vasopressor, goal map >65 -add steroids -defer cvc and arterial line at this time, cont discussion with son regarding goc.  -empiric abx Lactic acidosis -cont volume - Hyponatremia/hypochloremia -ivf ongoing 2/2 n/v and poor po intake N/v -supportive care with anti emetic agent -ivf Dementia -baseline T2dm with hyperglycemia:  -ssi ongoing    Labs   CBC: Recent Labs  Lab 02/06/24 2101  WBC 8.6  HGB 13.2  HCT 40.1  MCV 82.9  PLT 395    Basic Metabolic Panel: Recent Labs  Lab 02/06/24 2101  NA 128*  K 4.7  CL 93*  CO2 21*  GLUCOSE 214*  BUN 20  CREATININE 0.47  CALCIUM  9.9   GFR: CrCl cannot be calculated (Unknown ideal weight.). Recent Labs  Lab 02/06/24 2101 02/06/24 2109 02/07/24 0021  WBC 8.6  --   --   LATICACIDVEN  --  2.6* 2.5*    Liver Function Tests: Recent Labs  Lab 02/06/24 2101  AST 23  ALT 10  ALKPHOS 97  BILITOT 0.5  PROT 7.5  ALBUMIN 3.2*  Recent Labs  Lab 02/06/24 2101  LIPASE 33   No results for input(s): AMMONIA in the last 168 hours.  ABG No results found for: PHART, PCO2ART, PO2ART, HCO3, TCO2, ACIDBASEDEF, O2SAT   Coagulation Profile: Recent Labs  Lab 02/06/24 2106  INR 1.3*    Cardiac Enzymes: No results for input(s): CKTOTAL, CKMB, CKMBINDEX, TROPONINI in the last 168 hours.  HbA1C: Hgb A1c MFr Bld  Date/Time Value Ref Range Status  05/12/2023 07:23 AM 7.1 (H) 4.8 - 5.6 % Final    Comment:    (NOTE)         Prediabetes: 5.7 - 6.4         Diabetes:  >6.4         Glycemic control for adults with diabetes: <7.0     CBG: Recent Labs  Lab 02/06/24 2058  GLUCAP 194*    Review of Systems:   As per  HPI  Past Medical History:  She,  has a past medical history of Diabetes mellitus, High cholesterol, Hypertension, S/P angioplasty with stent, and Skin cancer of nose.   Surgical History:   Past Surgical History:  Procedure Laterality Date   APPENDECTOMY     TONSILLECTOMY       Social History:   reports that she has never smoked. She has never used smokeless tobacco. She reports that she does not drink alcohol  and does not use drugs.   Family History:  Her family history includes Heart attack in her mother; Hypertension in her brother. There is no history of Stroke.   Allergies Allergies  Allergen Reactions   Atorvastatin Other (See Comments)   Nsaids Other (See Comments)   Cephalexin Rash   Codeine Rash    Angioedema (ALLERGY/intolerance) Patient stated,  I don't remember what happens. I think its a body rash.   Ibandronate Rash   Lipitor [Atorvastatin Calcium ] Rash    Angioedema (ALLERGY/intolerance) Patient stated,  I don't remember what happens. I think its a body rash.   Niaspan [Niacin Er (Antihyperlipidemic)] Rash    Angioedema (ALLERGY/intolerance) Patient stated,  I don't remember what happens. I think its a body rash.   Nitrofurantoin Rash   Ticlid [Ticlopidine Hcl] Rash    Angioedema (ALLERGY/intolerance) Pt. Stated,  I can't remember what happens. I think its a body rash.   Zocor [Simvastatin - High Dose] Rash    Angioedema (ALLERGY/intolerance) Patient stated,  I don't remember what happens. I think its a body rash.     Home Medications  Prior to Admission medications   Medication Sig Start Date End Date Taking? Authorizing Provider  acarbose  (PRECOSE ) 25 MG tablet Take 25 mg by mouth every evening.     [provider]  acetaminophen  (TYLENOL ) 500 MG tablet Take 1 tablet (500 mg  total) by mouth every 4 (four) hours as needed for moderate pain. 04/22/17   Mesner, Selinda, MD  apixaban  (ELIQUIS ) 5 MG TABS tablet Take 2 tablets (10 mg total) by mouth 2 (two) times daily for 7 days, THEN 1 tablet (5 mg total) 2 (two) times daily for 23 days. 05/14/23 06/13/23  Bryn Bernardino NOVAK, MD  ASPIRIN  LOW DOSE 81 MG EC tablet Take 81 mg by mouth daily. 08/30/21   [provider]  cromolyn  (OPTICROM ) 4 % ophthalmic solution 1 drop 4 (four) times daily. 05/26/21   [provider]  cycloSPORINE  (RESTASIS ) 0.05 % ophthalmic emulsion Place 1 drop into both eyes 2 (two) times daily. Use for dry eyes.  [provider]  denosumab  (PROLIA ) 60 MG/ML SOSY injection Inject 60 mg into the skin every 6 (six) months. September/October was last injection, Caregiver wasn't sure which month.    [provider]  dorzolamide -timolol  (COSOPT ) 22.3-6.8 MG/ML ophthalmic solution Place 1 drop into both eyes 2 (two) times daily. Use for glaucoma    [provider]  Eyelid Cleansers (OCUSOFT LID SCRUB EX) Apply topically 2 (two) times daily. As directed    [provider]  latanoprost  (XALATAN ) 0.005 % ophthalmic solution Place 1 drop into both eyes at bedtime.     [provider]  Multiple Vitamin (MULTIVITAMIN) capsule Take 1 capsule by mouth daily. Patient takes Centrum Silver A-Z    [provider]  polyethylene glycol (MIRALAX ) 17 g packet Take 17 g by mouth daily. 07/16/23   Bero, Michael M, MD  Polyvinyl Alcohol -Povidone PF (REFRESH) 1.4-0.6 % SOLN See admin instructions.    [provider]  rosuvastatin  (CRESTOR ) 10 MG tablet Take 10 mg by mouth daily.    [provider]     Critical care time: 

## 2024-02-07 NOTE — ED Triage Notes (Signed)
 Patient arrives via [facility].

## 2024-02-07 NOTE — ED Notes (Signed)
 Son is at bedside. Son reports that she is had a good day prior to coming to the ER. States after dinner he checked her and he couldn't get a good pulse oximetry or blood pressure on the patient at home. Patient lives with son.

## 2024-02-07 NOTE — TOC CM/SW Note (Signed)
 Transition of Care [facility]) - Inpatient Brief Assessment   Patient Details  Name: Jennifer Gordon MRN: [id number] Date of Birth: 1921/11/03  [facility] CM/SW Contact:    Lauraine FORBES Saa, LCSWA Phone Number: 02/07/2024, 9:40 AM   Clinical Narrative:  9:40 AM Per chart review, patient resides at home and has a HH aide. Patient has a PCP and insurance. Patient does not have SNF history. Patient has HH history with [facility]. Patient has DME (hospital bed) history with [facility]. Patient's preferred pharmacy is [facility] [address]. No TOC needs identified at this time. TOC will continue to follow and be available to assist.  Transition of Care Asessment: Insurance and Status: Insurance coverage has been reviewed Patient has primary care physician: Yes Home environment has been reviewed: Private Residence Prior level of function:: N/A Prior/Current Home Services: Current home services (Has HH Aide) Social Drivers of Health Review: SDOH reviewed no interventions necessary Readmission risk has been reviewed: Yes (Currently Yellow 17%) Transition of care needs: no transition of care needs at this time

## 2024-02-07 NOTE — Plan of Care (Signed)
  Problem: Clinical Measurements: Goal: Ability to maintain clinical measurements within normal limits will improve Outcome: Progressing Goal: Will remain free from infection Outcome: Progressing Goal: Cardiovascular complication will be avoided Outcome: Progressing   Problem: Nutrition: Goal: Adequate nutrition will be maintained Outcome: Progressing   Problem: Coping: Goal: Level of anxiety will decrease Outcome: Progressing   

## 2024-02-07 NOTE — Progress Notes (Deleted)
 NAME:  Jennifer Gordon, MRN:  [id number], DOB:  13-Aug-1921, LOS: 0 ADMISSION DATE:  02/06/2024, CONSULTATION DATE:  02/07/24 REFERRING MD:  EDP, CHIEF COMPLAINT:  not feeling well   History of Present Illness:  89 yo female presented with her son after ~2-3 days of not feeling well. Pt was reportedly not consistently eating and drinking and son was concerned she was getting ill. No fever documented at home but reportedly had the chills and rigors. He states that she was otherwise at her baseline, no change in bowel or urinary habits. No n/v/d. She appeared to be improving on Tuesday and Wed in the afternoon she began having worsening chills and he brought pt in.   On arrival she was noted to be hypotensive, without fever, no elevated leukocytes, cx pending, small infiltrate on R lung and other imaging relatively benign for source of infection. Started on abx for community acquired pathogens considering her living at home with her son. She was transferred to ED for evaluation on 16 norepi to maintain BP via peripheral iv.   All history is obtained from son and chart review as pt is minimally verbal and baseline dementia. Just continues to moan that it hurts and please help me as her piv is manipulated for blood and medication infusion.   Pertinent  Medical History  Reported dementia T2dm with hyperglycemia  Significant Hospital Events: Including procedures, antibiotic start and stop dates in addition to other pertinent events   Admitted to ICU 9/11  Interim History / Subjective:    Objective    Blood pressure 110/78, pulse 95, temperature 98.4 F (36.9 C), temperature source Axillary, resp. rate (!) 23, weight 65.8 kg, SpO2 100%.       No intake or output data in the 24 hours ending 02/07/24 0505 Filed Weights   02/07/24 0121  Weight: 65.8 kg    Examination: General: frail elderly woman laying in bed HENT: ncat, poor dentition, mmmp, perrla Lungs: ctab Cardiovascular: irreg  irreg Abdomen: nt/nd bs + Extremities: no c/c/e Neuro: not following commands, calling out it hurts please help me. No focal deficits but certainly slowed movements when she is pulling at lines and gown GU: deferred  Resolved problem list   Assessment and Plan  Shock,  undetermined etiology ? sepsis -no elevated wbc, afebrile, no clear source, urine is + leuks but no nitrites, cxr > small area of infiltrate -titrate vasopressor, goal map >65 -add steroids -defer cvc and arterial line at this time, cont discussion with son regarding goc.  -empiric abx Lactic acidosis -cont volume - Hyponatremia/hypochloremia -ivf ongoing 2/2 n/v and poor po intake N/v -supportive care with anti emetic agent -ivf Dementia -baseline T2dm with hyperglycemia:  -ssi ongoing    Labs   CBC: Recent Labs  Lab 02/06/24 2101  WBC 8.6  HGB 13.2  HCT 40.1  MCV 82.9  PLT 395    Basic Metabolic Panel: Recent Labs  Lab 02/06/24 2101  NA 128*  K 4.7  CL 93*  CO2 21*  GLUCOSE 214*  BUN 20  CREATININE 0.47  CALCIUM  9.9   GFR: CrCl cannot be calculated (Unknown ideal weight.). Recent Labs  Lab 02/06/24 2101 02/06/24 2109 02/07/24 0021  WBC 8.6  --   --   LATICACIDVEN  --  2.6* 2.5*    Liver Function Tests: Recent Labs  Lab 02/06/24 2101  AST 23  ALT 10  ALKPHOS 97  BILITOT 0.5  PROT 7.5  ALBUMIN 3.2*  Recent Labs  Lab 02/06/24 2101  LIPASE 33   No results for input(s): AMMONIA in the last 168 hours.  ABG No results found for: PHART, PCO2ART, PO2ART, HCO3, TCO2, ACIDBASEDEF, O2SAT   Coagulation Profile: Recent Labs  Lab 02/06/24 2106  INR 1.3*    Cardiac Enzymes: No results for input(s): CKTOTAL, CKMB, CKMBINDEX, TROPONINI in the last 168 hours.  HbA1C: Hgb A1c MFr Bld  Date/Time Value Ref Range Status  05/12/2023 07:23 AM 7.1 (H) 4.8 - 5.6 % Final    Comment:    (NOTE)         Prediabetes: 5.7 - 6.4         Diabetes:  >6.4         Glycemic control for adults with diabetes: <7.0     CBG: Recent Labs  Lab 02/06/24 2058  GLUCAP 194*    Review of Systems:   As per  HPI  Past Medical History:  She,  has a past medical history of Diabetes mellitus, High cholesterol, Hypertension, S/P angioplasty with stent, and Skin cancer of nose.   Surgical History:   Past Surgical History:  Procedure Laterality Date   APPENDECTOMY     TONSILLECTOMY       Social History:   reports that she has never smoked. She has never used smokeless tobacco. She reports that she does not drink alcohol  and does not use drugs.   Family History:  Her family history includes Heart attack in her mother; Hypertension in her brother. There is no history of Stroke.   Allergies Allergies  Allergen Reactions   Atorvastatin Other (See Comments)   Nsaids Other (See Comments)   Cephalexin Rash   Codeine Rash    Angioedema (ALLERGY/intolerance) Patient stated,  I don't remember what happens. I think its a body rash.   Ibandronate Rash   Lipitor [Atorvastatin Calcium ] Rash    Angioedema (ALLERGY/intolerance) Patient stated,  I don't remember what happens. I think its a body rash.   Niaspan [Niacin Er (Antihyperlipidemic)] Rash    Angioedema (ALLERGY/intolerance) Patient stated,  I don't remember what happens. I think its a body rash.   Nitrofurantoin Rash   Ticlid [Ticlopidine Hcl] Rash    Angioedema (ALLERGY/intolerance) Pt. Stated,  I can't remember what happens. I think its a body rash.   Zocor [Simvastatin - High Dose] Rash    Angioedema (ALLERGY/intolerance) Patient stated,  I don't remember what happens. I think its a body rash.     Home Medications  Prior to Admission medications   Medication Sig Start Date End Date Taking? Authorizing Provider  acarbose  (PRECOSE ) 25 MG tablet Take 25 mg by mouth every evening.     [provider]  acetaminophen  (TYLENOL ) 500 MG tablet Take 1 tablet (500 mg  total) by mouth every 4 (four) hours as needed for moderate pain. 04/22/17   Mesner, Selinda, MD  apixaban  (ELIQUIS ) 5 MG TABS tablet Take 2 tablets (10 mg total) by mouth 2 (two) times daily for 7 days, THEN 1 tablet (5 mg total) 2 (two) times daily for 23 days. 05/14/23 06/13/23  Bryn Bernardino NOVAK, MD  ASPIRIN  LOW DOSE 81 MG EC tablet Take 81 mg by mouth daily. 08/30/21   [provider]  cromolyn  (OPTICROM ) 4 % ophthalmic solution 1 drop 4 (four) times daily. 05/26/21   [provider]  cycloSPORINE  (RESTASIS ) 0.05 % ophthalmic emulsion Place 1 drop into both eyes 2 (two) times daily. Use for dry eyes.  [provider]  denosumab  (PROLIA ) 60 MG/ML SOSY injection Inject 60 mg into the skin every 6 (six) months. September/October was last injection, Caregiver wasn't sure which month.    [provider]  dorzolamide -timolol  (COSOPT ) 22.3-6.8 MG/ML ophthalmic solution Place 1 drop into both eyes 2 (two) times daily. Use for glaucoma    [provider]  Eyelid Cleansers (OCUSOFT LID SCRUB EX) Apply topically 2 (two) times daily. As directed    [provider]  latanoprost  (XALATAN ) 0.005 % ophthalmic solution Place 1 drop into both eyes at bedtime.     [provider]  Multiple Vitamin (MULTIVITAMIN) capsule Take 1 capsule by mouth daily. Patient takes Centrum Silver A-Z    [provider]  polyethylene glycol (MIRALAX ) 17 g packet Take 17 g by mouth daily. 07/16/23   Bero, Michael M, MD  Polyvinyl Alcohol -Povidone PF (REFRESH) 1.4-0.6 % SOLN See admin instructions.    [provider]  rosuvastatin  (CRESTOR ) 10 MG tablet Take 10 mg by mouth daily.    [provider]     Critical care time: 

## 2024-02-07 NOTE — ED Notes (Signed)
 Patients heart rate continues to decrease to less than 30 BPM. Dr Raford was made aware.

## 2024-02-07 NOTE — ED Provider Notes (Signed)
 Patient arrived from [facility], she is in septic shock with blood pressure being maintained on norepinephrine  infusion but blood pressure is barely adequate.  She is resting comfortably.  Son is at bedside and I have explained that this is a life-threatening condition.  I am consulting the intensivist.  Decision will need to be made whether to admit to ICU or to a stepdown unit.   Raford Lenis, MD 02/07/24 [phone]

## 2024-02-08 ENCOUNTER — Encounter (HOSPITAL_COMMUNITY): Payer: Self-pay | Admitting: Critical Care Medicine

## 2024-02-08 DIAGNOSIS — E871 Hypo-osmolality and hyponatremia: Secondary | ICD-10-CM

## 2024-02-08 DIAGNOSIS — A419 Sepsis, unspecified organism: Secondary | ICD-10-CM

## 2024-02-08 DIAGNOSIS — N3000 Acute cystitis without hematuria: Secondary | ICD-10-CM | POA: Diagnosis not present

## 2024-02-08 DIAGNOSIS — Z7401 Bed confinement status: Secondary | ICD-10-CM

## 2024-02-08 DIAGNOSIS — Z66 Do not resuscitate: Secondary | ICD-10-CM

## 2024-02-08 DIAGNOSIS — J189 Pneumonia, unspecified organism: Secondary | ICD-10-CM

## 2024-02-08 DIAGNOSIS — R6521 Severe sepsis with septic shock: Secondary | ICD-10-CM

## 2024-02-08 LAB — BASIC METABOLIC PANEL WITH GFR
Anion gap: 9 (ref 5–15)
BUN: 10 mg/dL (ref 8–23)
CO2: 19 mmol/L — ABNORMAL LOW (ref 22–32)
Calcium: 9 mg/dL (ref 8.9–10.3)
Chloride: 102 mmol/L (ref 98–111)
Creatinine, Ser: 0.46 mg/dL (ref 0.44–1.00)
GFR, Estimated: >60 mL/min (ref >60)
Glucose, Bld: 116 mg/dL — ABNORMAL HIGH (ref 70–99)
Potassium: 4.7 mmol/L (ref 3.5–5.1)
Sodium: 130 mmol/L — ABNORMAL LOW (ref 135–145)

## 2024-02-08 LAB — NA AND K (SODIUM & POTASSIUM), RAND UR
Potassium Urine: 14 mmol/L
Sodium, Ur: <30 mmol/L

## 2024-02-08 LAB — OSMOLALITY, URINE: Osmolality, Ur: 171 mosm/kg — ABNORMAL LOW (ref 300–900)

## 2024-02-08 LAB — CBC
HCT: 32.3 % — ABNORMAL LOW (ref 36.0–46.0)
Hemoglobin: 10.3 g/dL — ABNORMAL LOW (ref 12.0–15.0)
MCH: 27 pg (ref 26.0–34.0)
MCHC: 31.9 g/dL (ref 30.0–36.0)
MCV: 84.8 fL (ref 80.0–100.0)
Platelets: 240 K/uL (ref 150–400)
RBC: 3.81 MIL/uL — ABNORMAL LOW (ref 3.87–5.11)
RDW: 16.6 % — ABNORMAL HIGH (ref 11.5–15.5)
WBC: 9.9 K/uL (ref 4.0–10.5)
nRBC: 0 % (ref 0.0–0.2)

## 2024-02-08 LAB — GLUCOSE, CAPILLARY
Glucose-Capillary: 155 mg/dL — ABNORMAL HIGH (ref 70–99)
Glucose-Capillary: 106 mg/dL — ABNORMAL HIGH (ref 70–99)
Glucose-Capillary: 231 mg/dL — ABNORMAL HIGH (ref 70–99)
Glucose-Capillary: 397 mg/dL — ABNORMAL HIGH (ref 70–99)
Glucose-Capillary: 215 mg/dL — ABNORMAL HIGH (ref 70–99)

## 2024-02-08 LAB — URINE CULTURE: Culture: NO GROWTH

## 2024-02-08 LAB — MAGNESIUM: Magnesium: 2 mg/dL (ref 1.7–2.4)

## 2024-02-08 LAB — TSH: TSH: 2.32 u[IU]/mL (ref 0.350–4.500)

## 2024-02-08 LAB — OSMOLALITY: Osmolality: 296 mosm/kg — ABNORMAL HIGH (ref 275–295)

## 2024-02-08 LAB — STREP PNEUMONIAE URINARY ANTIGEN: Strep Pneumo Urinary Antigen: NEGATIVE

## 2024-02-08 MED ORDER — SODIUM CHLORIDE 0.9 % IV SOLN: INTRAVENOUS | Status: DC

## 2024-02-08 MED ORDER — LATANOPROST 0.005 % OP SOLN
1.0000 [drp] | Freq: Every day | OPHTHALMIC | Status: DC
  Administered 2024-02-08 – 2024-02-09 (×2): 1 [drp] via OPHTHALMIC
  Filled 2024-02-08: qty 2.5

## 2024-02-08 MED ORDER — DORZOLAMIDE HCL-TIMOLOL MAL 2-0.5 % OP SOLN
1.0000 [drp] | Freq: Two times a day (BID) | OPHTHALMIC | Status: DC
  Administered 2024-02-08 – 2024-02-10 (×5): 1 [drp] via OPHTHALMIC
  Filled 2024-02-08: qty 10

## 2024-02-08 MED ORDER — CROMOLYN SODIUM 4 % OP SOLN
1.0000 [drp] | Freq: Four times a day (QID) | OPHTHALMIC | Status: DC
  Administered 2024-02-08 – 2024-02-10 (×6): 1 [drp] via OPHTHALMIC
  Filled 2024-02-08: qty 10

## 2024-02-08 MED ORDER — CYCLOSPORINE 0.05 % OP EMUL
1.0000 [drp] | Freq: Two times a day (BID) | OPHTHALMIC | Status: DC
  Administered 2024-02-08 – 2024-02-10 (×5): 1 [drp] via OPHTHALMIC
  Filled 2024-02-08 (×7): qty 30

## 2024-02-08 NOTE — Assessment & Plan Note (Addendum)
 02-08-2024 on IV rocephin , IV zithromax  both Day #2. Check legionella, strep pneum antigen.  02-09-2024 completed 3 days of IV Zithromax . On RA. IV Rocephin  Day #3. Strep pneumo antigen Negative. Awaiting blood cx on GPR. Likely contaminant. Will probably be able to DC to home tomorrow. Caregivers states pt is doing well at home.  02-10-2024 Day #4 Rocephin . Will discharge to complete today of 10 days of abx including her IV rocephin  days given her septic shock on admission. Blood cx growing bacillus which is a contaminant. Completed treatment with duricef 500 mg bid x 6 days at home. Legionella antigen is negative.

## 2024-02-08 NOTE — Assessment & Plan Note (Addendum)
 02-08-2024 confirmed with pt's son Garrel that pt is bedbound.

## 2024-02-08 NOTE — Progress Notes (Addendum)
 PROGRESS NOTE    Jennifer Gordon  FMW:[id number] DOB: September 16, 1921 DOA: 02/06/2024 PCP: Delayne Artist [name], MD  Subjective: Pt seen and examined. Met with pt's son Garrel at bedside. He states that pt has 24 hour care at home. This is supplied by son and 3 other caregivers.  Pt still very lethargic. Off vasopressors this AM. Transferred to [facility] service today.   Hospital Course: CC: vomiting, low BP. Hx of dementia. Non-verbal at baseline  HPI: 89 yo female presented with her son after ~2-3 days of not feeling well. Pt was reportedly not consistently eating and drinking and son was concerned she was getting ill. No fever documented at home but reportedly had the chills and rigors. He states that she was otherwise at her baseline, no change in bowel or urinary habits. No n/v/d. She appeared to be improving on Tuesday and Wed in the afternoon she began having worsening chills and he brought pt in.    On arrival she was noted to be hypotensive, without fever, no elevated leukocytes, cx pending, small infiltrate on R lung and other imaging relatively benign for source of infection. Started on abx for community acquired pathogens considering her living at home with her son. She was transferred to ED for evaluation on 16 norepi to maintain BP via peripheral iv.    All history is obtained from son and chart review as pt is minimally verbal and baseline dementia. Just continues to moan that it hurts and please help me as her piv is manipulated for blood and medication infusion.     Significant Events: Admitted 02/06/2024 to ICU by PCCM for septic shock 02-07-2024 weaned off levophed  gtts 02-08-2024 transferred to [facility]  Admission Labs: Lipase 33 Na 128, K 4.7, CO2 of 21, BUN 20, Scr 0.47, glu 214 T prot 7.5, alb 3.2, AST 23, ALT 10, alk phos 97, t. Bili 0.5 WBC 8.6, HgB 13.2, plt 395 INR 1.3 Lactic acid 2.6 Covid/rsv/flu negative Cath UA, negative nitrite, + LE, WBC >50, no bacteria  Admission  Imaging Studies:   Significant Labs: AM Cortisol 16.1 HgBA1C of 7.4% MRSA screen negative  Significant Imaging Studies: CXR Faint airspace density in the right mid to lower lung field may represent edema or pneumonia. CT abd/pelvis Bladder wall thickening and indistinctness concerning for cystitis. Recommend clinical correlation. Mild bilateral hydronephrosis.  Extensive sigmoid diverticulosis.  No active diverticulitis. Coronary artery disease, aortoiliac atherosclerosis. Bilateral lower lobe airspace opacities concerning for pneumonia.  Antibiotic Therapy: Anti-infectives (From admission, onward)    Start     Dose/Rate Route Frequency Ordered Stop   02/07/24 1000  cefTRIAXone  (ROCEPHIN ) 1 g in sodium chloride  0.9 % 100 mL IVPB        1 g 200 mL/hr over 30 Minutes Intravenous Every 24 hours 02/07/24 0545     02/07/24 0945  azithromycin  (ZITHROMAX ) 500 mg in sodium chloride  0.9 % 250 mL IVPB        500 mg 250 mL/hr over 60 Minutes Intravenous Every 24 hours 02/07/24 0849 02/10/24 0944   02/06/24 2215  ceFEPIme  (MAXIPIME ) 2 g in sodium chloride  0.9 % 100 mL IVPB        2 g 200 mL/hr over 30 Minutes Intravenous  Once 02/06/24 2203 02/07/24 0032   02/06/24 2215  metroNIDAZOLE  (FLAGYL ) IVPB 500 mg        500 mg 100 mL/hr over 60 Minutes Intravenous  Once 02/06/24 2203 02/07/24 0034   02/06/24 2215  vancomycin  (VANCOCIN ) IVPB 1000 mg/200  mL premix        1,000 mg 200 mL/hr over 60 Minutes Intravenous  Once 02/06/24 2203 02/07/24 0036       Procedures:   Consultants: PCCM    Assessment and Plan: * Septic shock (HCC) 02-08-2024 present on admission. admitted to ICU by PCCM for septic shock. WBC 8.6, lactic acid 2.6, BP as low as 55/39.  Was placed on levophed . Give IVF, IV hydrocortisone , IV ABX. BP improved with IVF and vasopressors. Levophed  weaned off by 2 pm on 02-07-2024. Pt transferred to [facility].  Being treated for CAP and UTI as source of her septic shock.  Community  acquired bilateral lower lobe pneumonia 02-08-2024 on IV rocephin , IV zithromax  both Day #2. Check legionella, strep pneum antigen.  Acute cystitis 02-08-2024 on IV rocephin  Day #2. Urine cx pending.  Chronic hyponatremia 02-08-2024 chronic. Baseline around 130s. AM cortisol was normal. Check TSH. Send urine osm, urine Na, serum Osm. Start IV NS as pt is not eating and drinking well. My suspicion is that her hyponatremia is from chronic low solute intake. Urine Na will make diagnosis.  Bedbound 02-08-2024 confirmed with pt's son Garrel that pt is bedbound.  Type 2 diabetes mellitus with unspecified complications (HCC) 02-08-2024 on SSI. A1C of 7.4%. however given her advance age of 89 yo, I would not advocate for any stricter glucose control.  Dementia without behavioral disturbance (HCC) - Non-verbal at baseline 02-08-2024 chronic.  DNR (do not resuscitate) 02-08-2024 Confirmed DNR status with pt's son    DVT prophylaxis: heparin  injection 5,000 Units Start: 02/07/24 0600 SCDs Start: 02/07/24 0544    Code Status: Limited: Do not attempt resuscitation (DNR) -DNR-LIMITED -Do Not Intubate/DNI  Family Communication: spoke with son Garrel at bedside Disposition Plan: home Reason for continuing need for hospitalization: remains on IV ABX and IVF.  Objective: Vitals:   02/08/24 0600 02/08/24 0607 02/08/24 0700 02/08/24 1120  BP: (!) 85/48 98/67 (!) 96/53   Pulse: 64 79 68   Resp: 17 16 17    Temp:    (!) 96.2 F (35.7 C)  TempSrc:    Axillary  SpO2: 93% 97% 96%   Weight:      Height:        Intake/Output Summary (Last 24 hours) at 02/08/2024 1121 Last data filed at 02/08/2024 0400 Gross per 24 hour  Intake 2253.18 ml  Output 450 ml  Net 1803.18 ml   Filed Weights   02/07/24 0121 02/07/24 0514 02/08/24 0412  Weight: 65.8 kg 65.8 kg 59.3 kg    Examination:  Physical Exam Vitals and nursing note reviewed.  Constitutional:      Comments: Chronically ill appearing. Not  responsive to voice  HENT:     Head: Normocephalic and atraumatic.  Cardiovascular:     Rate and Rhythm: Normal rate and regular rhythm.  Pulmonary:     Effort: Pulmonary effort is normal. No respiratory distress.     Breath sounds: Normal breath sounds.  Abdominal:     General: Bowel sounds are normal. There is no distension.  Musculoskeletal:     Right lower leg: No edema.     Left lower leg: No edema.  Skin:    General: Skin is warm and dry.     Capillary Refill: Capillary refill takes less than 2 seconds.     Data Reviewed: I have personally reviewed following labs and imaging studies  CBC: Recent Labs  Lab 02/06/24 2101 02/07/24 0528 02/08/24 0851  WBC 8.6 16.4* 9.9  HGB 13.2 8.0* 10.3*  HCT 40.1 25.1* 32.3*  MCV 82.9 86.9 84.8  PLT 395 269 240   Basic Metabolic Panel: Recent Labs  Lab 02/06/24 2101 02/07/24 0528 02/08/24 0851  NA 128*  --  130*  K 4.7  --  4.7  CL 93*  --  102  CO2 21*  --  19*  GLUCOSE 214*  --  116*  BUN 20  --  10  CREATININE 0.47 0.31* 0.46  CALCIUM  9.9  --  9.0  MG  --  0.8* 2.0   GFR: Estimated Creatinine Clearance: 30.3 mL/min (by C-G formula based on SCr of 0.46 mg/dL). Liver Function Tests: Recent Labs  Lab 02/06/24 2101  AST 23  ALT 10  ALKPHOS 97  BILITOT 0.5  PROT 7.5  ALBUMIN 3.2*   Recent Labs  Lab 02/06/24 2101  LIPASE 33   Coagulation Profile: Recent Labs  Lab 02/06/24 2106  INR 1.3*   HbA1C: Recent Labs    02/07/24 0528  HGBA1C 7.4*   CBG: Recent Labs  Lab 02/07/24 1552 02/07/24 1920 02/07/24 2336 02/08/24 0320 02/08/24 0731  GLUCAP 259* 215* 217* 155* 106*   Sepsis Labs: Recent Labs  Lab 02/06/24 2109 02/07/24 0021  LATICACIDVEN 2.6* 2.5*    Recent Results (from the past 240 hours)  Blood Culture (routine x 2)     Status: None (Preliminary result)   Collection Time: 02/06/24  9:05 PM   Specimen: Right Antecubital; Blood  Result Value Ref Range Status   Specimen Description    Final    RIGHT ANTECUBITAL Performed at [facility], [address], [place], [place] [address]    Special Requests   Final    BOTTLES DRAWN AEROBIC AND ANAEROBIC Blood Culture adequate volume Performed at [facility], [address] [place], [place] [address]    Culture   Final    NO GROWTH < 24 HOURS Performed at [facility], [address], [place], [place] [address]    Report Status PENDING  Incomplete  Blood Culture (routine x 2)     Status: None (Preliminary result)   Collection Time: 02/06/24  9:06 PM   Specimen: BLOOD RIGHT FOREARM  Result Value Ref Range Status   Specimen Description   Final    BLOOD RIGHT FOREARM Performed at [facility], [address], [place], [place] [address]    Special Requests   Final    BOTTLES DRAWN AEROBIC AND ANAEROBIC Blood Culture results may not be optimal due to an inadequate volume of blood received in culture bottles Performed at [facility], [address], [place], [place] [address]    Culture  Setup Time   Final    GRAM POSITIVE RODS AEROBIC BOTTLE ONLY CRITICAL RESULT CALLED TO, READ BACK BY AND VERIFIED WITH: MAYA LULLA DAUPHIN [id number] FCP Performed at [facility], [address], [place], [place] [address]    Culture GRAM POSITIVE RODS  Final   Report Status PENDING  Incomplete  Resp panel by RT-PCR (RSV, Flu A&B, Covid) Anterior Nasal Swab     Status: None   Collection Time: 02/06/24  9:27 PM   Specimen: Anterior Nasal Swab  Result Value Ref Range Status   SARS Coronavirus 2 by RT PCR NEGATIVE NEGATIVE Final    Comment: (NOTE) SARS-CoV-2 target nucleic acids are NOT DETECTED.  The SARS-CoV-2 RNA is generally detectable in upper respiratory specimens during the acute phase of infection.  The lowest concentration of SARS-CoV-2 viral copies this assay can detect is 138 copies/mL. A negative result does not preclude  SARS-Cov-2 infection and should not be used as the sole basis for treatment or other patient management decisions. A negative result may occur with  improper specimen collection/handling, submission of specimen other than nasopharyngeal swab, presence of viral mutation(s) within the areas targeted by this assay, and inadequate number of viral copies(<138 copies/mL). A negative result must be combined with clinical observations, patient history, and epidemiological information. The expected result is Negative.  Fact Sheet for Patients:  BloggerCourse.com  Fact Sheet for Healthcare Providers:  SeriousBroker.it  This test is no t yet approved or cleared by the [place]  FDA and  has been authorized for detection and/or diagnosis of SARS-CoV-2 by FDA under an Emergency Use Authorization (EUA). This EUA will remain  in effect (meaning this test can be used) for the duration of the COVID-19 declaration under Section 564(b)(1) of the Act, 21 U.S.C.section 360bbb-3(b)(1), unless the authorization is terminated  or revoked sooner.       Influenza A by PCR NEGATIVE NEGATIVE Final   Influenza B by PCR NEGATIVE NEGATIVE Final    Comment: (NOTE) The Xpert Xpress SARS-CoV-2/FLU/RSV plus assay is intended as an aid in the diagnosis of influenza from Nasopharyngeal swab specimens and should not be used as a sole basis for treatment. Nasal washings and aspirates are unacceptable for Xpert Xpress SARS-CoV-2/FLU/RSV testing.  Fact Sheet for Patients: BloggerCourse.com  Fact Sheet for Healthcare Providers: SeriousBroker.it  This test is not yet approved or cleared by the [place]  FDA and has been authorized for detection and/or diagnosis of SARS-CoV-2 by FDA under an Emergency Use Authorization (EUA). This EUA will remain in effect (meaning this test can be used) for the duration of  the COVID-19 declaration under Section 564(b)(1) of the Act, 21 U.S.C. section 360bbb-3(b)(1), unless the authorization is terminated or revoked.     Resp Syncytial Virus by PCR NEGATIVE NEGATIVE Final    Comment: (NOTE) Fact Sheet for Patients: BloggerCourse.com  Fact Sheet for Healthcare Providers: SeriousBroker.it  This test is not yet approved or cleared by the [place]  FDA and has been authorized for detection and/or diagnosis of SARS-CoV-2 by FDA under an Emergency Use Authorization (EUA). This EUA will remain in effect (meaning this test can be used) for the duration of the COVID-19 declaration under Section 564(b)(1) of the Act, 21 U.S.C. section 360bbb-3(b)(1), unless the authorization is terminated or revoked.  Performed at [facility], [address], [place], [place] [address]   Urine Culture     Status: None   Collection Time: 02/07/24 12:21 AM   Specimen: Urine, Random  Result Value Ref Range Status   Specimen Description   Final    URINE, RANDOM Performed at [facility], [address], [place], [place] [address]    Special Requests   Final    NONE Reflexed from [id number] Performed at [facility], [address], [place], [place] [address]    Culture   Final    NO GROWTH Performed at [facility], [address] [place], [place] [address]    Report Status 02/08/2024 FINAL  Final  MRSA Next Gen by PCR, Nasal     Status: None   Collection Time: 02/07/24  5:44 AM   Specimen: Urine, Clean Catch; Nasal Swab  Result Value Ref Range Status   MRSA by PCR Next Gen NOT DETECTED NOT DETECTED  Final    Comment: (NOTE) The GeneXpert MRSA Assay (FDA approved for NASAL specimens only), is one component of a comprehensive MRSA colonization surveillance program. It is not intended to diagnose MRSA infection nor to guide or monitor treatment for MRSA  infections. Test performance is not FDA approved in patients less than 21 years old. Performed at [facility], [address] [place], [place] [address]      Radiology Studies: CT ABDOMEN PELVIS W CONTRAST Result Date: 02/07/2024 CLINICAL DATA:  Abdominal pain EXAM: CT ABDOMEN AND PELVIS WITH CONTRAST TECHNIQUE: Multidetector CT imaging of the abdomen and pelvis was performed using the standard protocol following bolus administration of intravenous contrast. RADIATION DOSE REDUCTION: This exam was performed according to the departmental dose-optimization program which includes automated exposure control, adjustment of the mA and/or kV according to patient size and/or use of iterative reconstruction technique. CONTRAST:  75mL OMNIPAQUE  IOHEXOL  300 MG/ML  SOLN COMPARISON:  11/22/2023 FINDINGS: Lower chest: Airspace disease in the lower lobes bilaterally concerning for pneumonia. Coronary artery and aortic atherosclerosis. No effusions. Hepatobiliary: No focal hepatic abnormality. Gallbladder unremarkable. Pancreas: No focal abnormality or ductal dilatation. Spleen: No focal abnormality.  Normal size. Adrenals/Urinary Tract: Adrenal glands normal. Urinary bladder is thick walled with indistinct bladder wall. Appearance is concerning for cystitis. Multiple renal cysts bilaterally, the largest in the left upper pole. Mild bilateral hydronephrosis. No visible stones. Stomach/Bowel: Extensive sigmoid diverticulosis. No active diverticulitis. Stomach and small bowel decompressed. No bowel obstruction or inflammatory process. Vascular/Lymphatic: Aortoiliac atherosclerosis. No evidence of aneurysm or adenopathy. Reproductive: Uterus and adnexa unremarkable.  No mass. Other: No free fluid or free air. Musculoskeletal: No acute bony abnormality IMPRESSION: Bladder wall thickening and indistinctness concerning for cystitis. Recommend clinical correlation. Mild bilateral hydronephrosis. Extensive sigmoid  diverticulosis.  No active diverticulitis. Coronary artery disease, aortoiliac atherosclerosis. Bilateral lower lobe airspace opacities concerning for pneumonia. Electronically Signed   By: Franky Crease M.D.   On: 02/07/2024 00:17   DG Chest Portable 1 View Result Date: 02/06/2024 CLINICAL DATA:  Fever. EXAM: PORTABLE CHEST 1 VIEW COMPARISON:  Chest radiograph dated 05/11/2023. FINDINGS: There is diffuse interstitial prominence. Faint airspace density in the right mid to lower lung field may represent edema or pneumonia. No pleural effusion or pneumothorax. Stable cardiac silhouette. Coronary vascular calcification or stent. Atherosclerotic calcification of the aorta. No acute osseous pathology. IMPRESSION: Faint airspace density in the right mid to lower lung field may represent edema or pneumonia. Electronically Signed   By: Vanetta Chou M.D.   On: 02/06/2024 22:12    Scheduled Meds:  Chlorhexidine  Gluconate Cloth  6 each Topical Daily   heparin   5,000 Units Subcutaneous Q8H   insulin  aspart  0-15 Units Subcutaneous Q4H   midodrine   10 mg Oral BID WC   Continuous Infusions:  sodium chloride  100 mL/hr at 02/08/24 1103   azithromycin  500 mg (02/08/24 1053)   cefTRIAXone  (ROCEPHIN )  IV 1 g (02/08/24 1107)     LOS: 1 day   Time spent: 60 minutes  Camellia Door, DO  [facility]  02/08/2024, 11:21 AM

## 2024-02-08 NOTE — Assessment & Plan Note (Addendum)
 02-08-2024 on SSI. A1C of 7.4%. however given her advance age of 89 yo, I would not advocate for any stricter glucose control.  02-09-2024 had hypoglycemia this AM. Started on D5LR due to hypoglycemia. Will stop SSI.  02-10-2024 stable. Pt can restart metformin  at home.

## 2024-02-08 NOTE — Progress Notes (Signed)
 PHARMACY - PHYSICIAN COMMUNICATION CRITICAL VALUE ALERT - BLOOD CULTURE IDENTIFICATION (BCID)  Jennifer Gordon is an 89 y.o. female who presented to [facility] on 02/06/2024 with a chief complaint of N/V and hypotension.  Assessment:  Started on ABX for community-acquired infection, now growing GPR in 1 of 4 blood cx bottles, likely contaminant.  Name of physician contacted: OOgan MD  Current antibiotics: ceftriaxone  and azithromycin   Changes to prescribed antibiotics recommended:  No changes needed  Jennifer Gordon, PharmD, BCPS  02/08/2024  5:37 AM

## 2024-02-08 NOTE — Assessment & Plan Note (Addendum)
 02-08-2024 on IV rocephin  Day #2. Urine cx pending.  02-09-2024 on IV rocephin  Day #3. Urine cx negative.  02-10-2024 Day #4 Rocephin . Urine cx negative. Blood cx growing bacillus which is a contaminant

## 2024-02-08 NOTE — Assessment & Plan Note (Addendum)
 02-08-2024 chronic. Baseline around 130s. AM cortisol was normal. Check TSH. Send urine osm, urine Na, serum Osm. Start IV NS as pt is not eating and drinking well. My suspicion is that her hyponatremia is from chronic low solute intake. Urine Na will make diagnosis.  02-09-2024 urine Na is <30, consistent with low solute intake as a factor in her hyponatremia. Pt was placed on NS overnight and Na increased from 128->131. Pt on low salt diet at home. Discussed with caregivers that pt can liberalizes her salt intake at home.  02-10-2024 chronic

## 2024-02-08 NOTE — Assessment & Plan Note (Addendum)
 02-08-2024 chronic.  02-09-2024 chronic. Pt will be 89 years old in November 2025.  02-10-2024 chronic

## 2024-02-08 NOTE — Hospital Course (Addendum)
 CC: vomiting, low BP. Hx of dementia. Non-verbal at baseline  HPI: 89 yo female presented with her son after ~2-3 days of not feeling well. Pt was reportedly not consistently eating and drinking and son was concerned she was getting ill. No fever documented at home but reportedly had the chills and rigors. He states that she was otherwise at her baseline, no change in bowel or urinary habits. No n/v/d. She appeared to be improving on Tuesday and Wed in the afternoon she began having worsening chills and he brought pt in.    On arrival she was noted to be hypotensive, without fever, no elevated leukocytes, cx pending, small infiltrate on R lung and other imaging relatively benign for source of infection. Started on abx for community acquired pathogens considering her living at home with her son. She was transferred to ED for evaluation on 16 norepi to maintain BP via peripheral iv.    All history is obtained from son and chart review as pt is minimally verbal and baseline dementia. Just continues to moan that it hurts and please help me as her piv is manipulated for blood and medication infusion.     Significant Events: Admitted 02/06/2024 to ICU by PCCM for septic shock 02-07-2024 weaned off levophed  gtts 02-08-2024 transferred to [facility]  Admission Labs: Lipase 33 Na 128, K 4.7, CO2 of 21, BUN 20, Scr 0.47, glu 214 T prot 7.5, alb 3.2, AST 23, ALT 10, alk phos 97, t. Bili 0.5 WBC 8.6, HgB 13.2, plt 395 INR 1.3 Lactic acid 2.6 Covid/rsv/flu negative Cath UA, negative nitrite, + LE, WBC >50, no bacteria  Admission Imaging Studies:   Significant Labs: AM Cortisol 16.1 HgBA1C of 7.4% MRSA screen negative Strep pneumo antigen Negative Legionella antigen is negative.  Significant Imaging Studies: CXR Faint airspace density in the right mid to lower lung field may represent edema or pneumonia. CT abd/pelvis Bladder wall thickening and indistinctness concerning for cystitis. Recommend  clinical correlation. Mild bilateral hydronephrosis.  Extensive sigmoid diverticulosis.  No active diverticulitis. Coronary artery disease, aortoiliac atherosclerosis. Bilateral lower lobe airspace opacities concerning for pneumonia.  Antibiotic Therapy: Anti-infectives (From admission, onward)    Start     Dose/Rate Route Frequency Ordered Stop   02/07/24 1000  cefTRIAXone  (ROCEPHIN ) 1 g in sodium chloride  0.9 % 100 mL IVPB        1 g 200 mL/hr over 30 Minutes Intravenous Every 24 hours 02/07/24 0545     02/07/24 0945  azithromycin  (ZITHROMAX ) 500 mg in sodium chloride  0.9 % 250 mL IVPB        500 mg 250 mL/hr over 60 Minutes Intravenous Every 24 hours 02/07/24 0849 02/10/24 0944   02/06/24 2215  ceFEPIme  (MAXIPIME ) 2 g in sodium chloride  0.9 % 100 mL IVPB        2 g 200 mL/hr over 30 Minutes Intravenous  Once 02/06/24 2203 02/07/24 0032   02/06/24 2215  metroNIDAZOLE  (FLAGYL ) IVPB 500 mg        500 mg 100 mL/hr over 60 Minutes Intravenous  Once 02/06/24 2203 02/07/24 0034   02/06/24 2215  vancomycin  (VANCOCIN ) IVPB 1000 mg/200 mL premix        1,000 mg 200 mL/hr over 60 Minutes Intravenous  Once 02/06/24 2203 02/07/24 0036       Procedures:   Consultants: PCCM

## 2024-02-08 NOTE — Assessment & Plan Note (Addendum)
 02-08-2024 present on admission. admitted to ICU by PCCM for septic shock. WBC 8.6, lactic acid 2.6, BP as low as 55/39.  Was placed on levophed . Give IVF, IV hydrocortisone , IV ABX. BP improved with IVF and vasopressors. Levophed  weaned off by 2 pm on 02-07-2024. Pt transferred to [facility].  Being treated for CAP and UTI as source of her septic shock.

## 2024-02-08 NOTE — Assessment & Plan Note (Signed)
 02-08-2024 Confirmed DNR status with pt's son

## 2024-02-08 NOTE — Subjective & Objective (Signed)
 Pt seen and examined. Met with pt's son Garrel at bedside. He states that pt has 24 hour care at home. This is supplied by son and 3 other caregivers.  Pt still very lethargic. Off vasopressors this AM. Transferred to [facility] service today.

## 2024-02-09 DIAGNOSIS — A419 Sepsis, unspecified organism: Secondary | ICD-10-CM | POA: Diagnosis not present

## 2024-02-09 DIAGNOSIS — N3000 Acute cystitis without hematuria: Secondary | ICD-10-CM | POA: Diagnosis not present

## 2024-02-09 DIAGNOSIS — E871 Hypo-osmolality and hyponatremia: Secondary | ICD-10-CM | POA: Diagnosis not present

## 2024-02-09 DIAGNOSIS — J189 Pneumonia, unspecified organism: Secondary | ICD-10-CM | POA: Diagnosis not present

## 2024-02-09 LAB — COMPREHENSIVE METABOLIC PANEL WITH GFR
ALT: 34 U/L (ref 0–44)
AST: 108 U/L — ABNORMAL HIGH (ref 15–41)
Albumin: 1.8 g/dL — ABNORMAL LOW (ref 3.5–5.0)
Alkaline Phosphatase: 55 U/L (ref 38–126)
Anion gap: 6 (ref 5–15)
BUN: 16 mg/dL (ref 8–23)
CO2: 21 mmol/L — ABNORMAL LOW (ref 22–32)
Calcium: 8.8 mg/dL — ABNORMAL LOW (ref 8.9–10.3)
Chloride: 104 mmol/L (ref 98–111)
Creatinine, Ser: 0.35 mg/dL — ABNORMAL LOW (ref 0.44–1.00)
GFR, Estimated: >60 mL/min (ref >60)
Glucose, Bld: 226 mg/dL — ABNORMAL HIGH (ref 70–99)
Potassium: 4.5 mmol/L (ref 3.5–5.1)
Sodium: 131 mmol/L — ABNORMAL LOW (ref 135–145)
Total Bilirubin: 0.3 mg/dL (ref 0.0–1.2)
Total Protein: 5.2 g/dL — ABNORMAL LOW (ref 6.5–8.1)

## 2024-02-09 LAB — GLUCOSE, CAPILLARY
Glucose-Capillary: 111 mg/dL — ABNORMAL HIGH (ref 70–99)
Glucose-Capillary: 170 mg/dL — ABNORMAL HIGH (ref 70–99)
Glucose-Capillary: 179 mg/dL — ABNORMAL HIGH (ref 70–99)
Glucose-Capillary: 189 mg/dL — ABNORMAL HIGH (ref 70–99)
Glucose-Capillary: 256 mg/dL — ABNORMAL HIGH (ref 70–99)
Glucose-Capillary: 61 mg/dL — ABNORMAL LOW (ref 70–99)

## 2024-02-09 LAB — CBC WITH DIFFERENTIAL/PLATELET
Abs Immature Granulocytes: 0.05 K/uL (ref 0.00–0.07)
Basophils Absolute: 0 K/uL (ref 0.0–0.1)
Basophils Relative: 0 %
Eosinophils Absolute: 0 K/uL (ref 0.0–0.5)
Eosinophils Relative: 0 %
HCT: 29.4 % — ABNORMAL LOW (ref 36.0–46.0)
Hemoglobin: 9.4 g/dL — ABNORMAL LOW (ref 12.0–15.0)
Immature Granulocytes: 1 %
Lymphocytes Relative: 13 %
Lymphs Abs: 1.2 K/uL (ref 0.7–4.0)
MCH: 27.3 pg (ref 26.0–34.0)
MCHC: 32 g/dL (ref 30.0–36.0)
MCV: 85.5 fL (ref 80.0–100.0)
Monocytes Absolute: 0.8 K/uL (ref 0.1–1.0)
Monocytes Relative: 9 %
Neutro Abs: 7.1 K/uL (ref 1.7–7.7)
Neutrophils Relative %: 77 %
Platelets: 280 K/uL (ref 150–400)
RBC: 3.44 MIL/uL — ABNORMAL LOW (ref 3.87–5.11)
RDW: 16.8 % — ABNORMAL HIGH (ref 11.5–15.5)
WBC: 9.2 K/uL (ref 4.0–10.5)
nRBC: 0 % (ref 0.0–0.2)

## 2024-02-09 LAB — LEGIONELLA PNEUMOPHILA SEROGP 1 UR AG: L. pneumophila Serogp 1 Ur Ag: NEGATIVE

## 2024-02-09 LAB — LACTIC ACID, PLASMA: Lactic Acid, Venous: 2 mmol/L (ref 0.5–1.9)

## 2024-02-09 MED ORDER — DEXTROSE IN LACTATED RINGERS 5 % IV SOLN: INTRAVENOUS | Status: DC

## 2024-02-09 MED ORDER — DEXTROSE 50 % IV SOLN
12.5000 g | INTRAVENOUS | Status: AC
  Administered 2024-02-09: 12.5 g via INTRAVENOUS
  Filled 2024-02-09: qty 50

## 2024-02-09 MED ORDER — ONDANSETRON HCL 4 MG/2ML IJ SOLN: 4.0000 mg | Freq: Four times a day (QID) | INTRAMUSCULAR | Status: DC | PRN

## 2024-02-09 MED ORDER — ACETAMINOPHEN 500 MG PO TABS
1000.0000 mg | ORAL_TABLET | Freq: Four times a day (QID) | ORAL | Status: DC | PRN
  Administered 2024-02-09: 1000 mg via ORAL
  Filled 2024-02-09: qty 2

## 2024-02-09 NOTE — Plan of Care (Addendum)
 Patient A & O x 1 (person)  Pt tolerating dysphagia diet.  Pt turned q2hr.  Tried ordering low loss air loss mattress , but was told none available.  Caretaker at bedside.  Problem: Education: Goal: Knowledge of General Education information will improve Description: Including pain rating scale, medication(s)/side effects and non-pharmacologic comfort measures Outcome: Progressing   Problem: Health Behavior/Discharge Planning: Goal: Ability to manage health-related needs will improve Outcome: Progressing   Problem: Clinical Measurements: Goal: Ability to maintain clinical measurements within normal limits will improve Outcome: Progressing Goal: Will remain free from infection Outcome: Progressing Goal: Diagnostic test results will improve Outcome: Progressing Goal: Respiratory complications will improve Outcome: Progressing Goal: Cardiovascular complication will be avoided Outcome: Progressing

## 2024-02-09 NOTE — Progress Notes (Signed)
 BG 61, D50% 12.5 g given IV.  Will recheck BG 15 mins.  Patient resting comfortably. Attempted to wake her up to eat breakfast but falls back asleep.  Laurence Locus, DO notified via epic chat.

## 2024-02-09 NOTE — Plan of Care (Signed)
  Problem: Nutrition: Goal: Adequate nutrition will be maintained Outcome: Progressing   Problem: Safety: Goal: Ability to remain free from injury will improve Outcome: Progressing   Problem: Nutritional: Goal: Maintenance of adequate nutrition will improve Outcome: Progressing   Problem: Skin Integrity: Goal: Risk for impaired skin integrity will decrease Outcome: Progressing

## 2024-02-09 NOTE — Progress Notes (Signed)
 Prn Tylenol  given for discomfort when turning.

## 2024-02-09 NOTE — Progress Notes (Signed)
 BG rechecked after 15 min , increased to 111

## 2024-02-09 NOTE — Progress Notes (Signed)
 PROGRESS NOTE    Jennifer Gordon  FMW:[id number] DOB: 1922/03/24 DOA: 02/06/2024 PCP: Delayne Artist [name], MD  Subjective: Pt seen and examined. Met with 2 of his caregivers Marval and Mec[place] at bedside. They have been providing care to their client for about 2.5 years. Pt is bedridden. Caregivers states pt does have a good appetite. Urine cx negative. Blood cx still show GPR but only in 1 bottle. Remains on IV rocephin . Completed 3 doses of IV zithromax .  D5W started this AM due to hypoglycemia. CBG now >150. Caregivers states pt is a mechanical soft diet at home. Will change her diet here in hospital.   Hospital Course: CC: vomiting, low BP. Hx of dementia. Non-verbal at baseline  HPI: 89 yo female presented with her son after ~2-3 days of not feeling well. Pt was reportedly not consistently eating and drinking and son was concerned she was getting ill. No fever documented at home but reportedly had the chills and rigors. He states that she was otherwise at her baseline, no change in bowel or urinary habits. No n/v/d. She appeared to be improving on Tuesday and Wed in the afternoon she began having worsening chills and he brought pt in.    On arrival she was noted to be hypotensive, without fever, no elevated leukocytes, cx pending, small infiltrate on R lung and other imaging relatively benign for source of infection. Started on abx for community acquired pathogens considering her living at home with her son. She was transferred to ED for evaluation on 16 norepi to maintain BP via peripheral iv.    All history is obtained from son and chart review as pt is minimally verbal and baseline dementia. Just continues to moan that it hurts and please help me as her piv is manipulated for blood and medication infusion.     Significant Events: Admitted 02/06/2024 to ICU by PCCM for septic shock 02-07-2024 weaned off levophed  gtts 02-08-2024 transferred to [facility]  Admission Labs: Lipase 33 Na 128, K  4.7, CO2 of 21, BUN 20, Scr 0.47, glu 214 T prot 7.5, alb 3.2, AST 23, ALT 10, alk phos 97, t. Bili 0.5 WBC 8.6, HgB 13.2, plt 395 INR 1.3 Lactic acid 2.6 Covid/rsv/flu negative Cath UA, negative nitrite, + LE, WBC >50, no bacteria  Admission Imaging Studies:   Significant Labs: AM Cortisol 16.1 HgBA1C of 7.4% MRSA screen negative Strep pneumo antigen Negative  Significant Imaging Studies: CXR Faint airspace density in the right mid to lower lung field may represent edema or pneumonia. CT abd/pelvis Bladder wall thickening and indistinctness concerning for cystitis. Recommend clinical correlation. Mild bilateral hydronephrosis.  Extensive sigmoid diverticulosis.  No active diverticulitis. Coronary artery disease, aortoiliac atherosclerosis. Bilateral lower lobe airspace opacities concerning for pneumonia.  Antibiotic Therapy: Anti-infectives (From admission, onward)    Start     Dose/Rate Route Frequency Ordered Stop   02/07/24 1000  cefTRIAXone  (ROCEPHIN ) 1 g in sodium chloride  0.9 % 100 mL IVPB        1 g 200 mL/hr over 30 Minutes Intravenous Every 24 hours 02/07/24 0545     02/07/24 0945  azithromycin  (ZITHROMAX ) 500 mg in sodium chloride  0.9 % 250 mL IVPB        500 mg 250 mL/hr over 60 Minutes Intravenous Every 24 hours 02/07/24 0849 02/10/24 0944   02/06/24 2215  ceFEPIme  (MAXIPIME ) 2 g in sodium chloride  0.9 % 100 mL IVPB        2 g 200 mL/hr over 30  Minutes Intravenous  Once 02/06/24 2203 02/07/24 0032   02/06/24 2215  metroNIDAZOLE  (FLAGYL ) IVPB 500 mg        500 mg 100 mL/hr over 60 Minutes Intravenous  Once 02/06/24 2203 02/07/24 0034   02/06/24 2215  vancomycin  (VANCOCIN ) IVPB 1000 mg/200 mL premix        1,000 mg 200 mL/hr over 60 Minutes Intravenous  Once 02/06/24 2203 02/07/24 0036       Procedures:   Consultants: PCCM    Assessment and Plan: * Septic shock (HCC) 02-08-2024 present on admission. admitted to ICU by PCCM for septic shock. WBC 8.6,  lactic acid 2.6, BP as low as 55/39.  Was placed on levophed . Give IVF, IV hydrocortisone , IV ABX. BP improved with IVF and vasopressors. Levophed  weaned off by 2 pm on 02-07-2024. Pt transferred to [facility].  Being treated for CAP and UTI as source of her septic shock.  Community acquired bilateral lower lobe pneumonia 02-08-2024 on IV rocephin , IV zithromax  both Day #2. Check legionella, strep pneum antigen.  02-09-2024 completed 3 days of IV Zithromax . On RA. IV Rocephin  Day #3. Strep pneumo antigen Negative. Awaiting blood cx on GPR. Likely contaminant. Will probably be able to DC to home tomorrow. Caregivers states pt is doing well at home.  Acute cystitis 02-08-2024 on IV rocephin  Day #2. Urine cx pending.  02-09-2024 on IV rocephin  Day #3. Urine cx negative.  Chronic hyponatremia 02-08-2024 chronic. Baseline around 130s. AM cortisol was normal. Check TSH. Send urine osm, urine Na, serum Osm. Start IV NS as pt is not eating and drinking well. My suspicion is that her hyponatremia is from chronic low solute intake. Urine Na will make diagnosis.  02-09-2024 urine Na is <30, consistent with low solute intake as a factor in her hyponatremia. Pt was placed on NS overnight and Na increased from 128->131. Pt on low salt diet at home. Discussed with caregivers that pt can liberalizes her salt intake at home.  Bedbound 02-08-2024 confirmed with pt's son Jennifer Gordon that pt is bedbound.  Type 2 diabetes mellitus with unspecified complications (HCC) 02-08-2024 on SSI. A1C of 7.4%. however given her advance age of 89 yo, I would not advocate for any stricter glucose control.  02-09-2024 had hypoglycemia this AM. Started on D5LR due to hypoglycemia. Will stop SSI.  Dementia without behavioral disturbance (HCC) - Non-verbal at baseline 02-08-2024 chronic.  02-09-2024 chronic. Pt will be 89 years old in November 2025.  DNR (do not resuscitate) 02-08-2024 Confirmed DNR status with pt's son     DVT  prophylaxis: heparin  injection 5,000 Units Start: 02/07/24 0600 SCDs Start: 02/07/24 0544    Code Status: Limited: Do not attempt resuscitation (DNR) -DNR-LIMITED -Do Not Intubate/DNI  Family Communication: discussed with pt's caregivers Debbie and Sandy at bedside Disposition Plan: home Reason for continuing need for hospitalization: remains on IV Rocephin .  Objective: Vitals:   02/09/24 0015 02/09/24 0337 02/09/24 0500 02/09/24 0828  BP: 101/61 (P) 100/62  (!) 93/54  Pulse: 84 (P) 70  75  Resp: 20 (P) 19  18  Temp: 98.5 F (36.9 C) (P) 97.7 F (36.5 C)  97.6 F (36.4 C)  TempSrc: Oral (P) Oral  Oral  SpO2: 100% (P) 100%  97%  Weight:   66 kg   Height:        Intake/Output Summary (Last 24 hours) at 02/09/2024 1339 Last data filed at 02/09/2024 0800 Gross per 24 hour  Intake 2240.29 ml  Output 350 ml  Net 1890.29 ml   Filed Weights   02/07/24 0514 02/08/24 0412 02/09/24 0500  Weight: 65.8 kg 59.3 kg 66 kg   Examination:  Physical Exam Vitals and nursing note reviewed.  Constitutional:      Comments: Chronically ill appearing.  Pt does wake up to voice. But is non-verbal.  HENT:     Head: Normocephalic and atraumatic.  Cardiovascular:     Rate and Rhythm: Normal rate and regular rhythm.  Pulmonary:     Effort: Pulmonary effort is normal.     Breath sounds: Normal breath sounds.  Abdominal:     General: Bowel sounds are normal.     Palpations: Abdomen is soft.  Musculoskeletal:     Right lower leg: No edema.     Left lower leg: No edema.  Skin:    General: Skin is warm and dry.     Capillary Refill: Capillary refill takes less than 2 seconds.  Neurological:     Comments: Non-verbal.   Data Reviewed: I have personally reviewed following labs and imaging studies  CBC: Recent Labs  Lab 02/06/24 2101 02/07/24 0528 02/08/24 0851 02/09/24 0252  WBC 8.6 16.4* 9.9 9.2  NEUTROABS  --   --   --  7.1  HGB 13.2 8.0* 10.3* 9.4*  HCT 40.1 25.1* 32.3* 29.4*  MCV  82.9 86.9 84.8 85.5  PLT 395 269 240 280   Basic Metabolic Panel: Recent Labs  Lab 02/06/24 2101 02/07/24 0528 02/08/24 0851 02/09/24 0252  NA 128*  --  130* 131*  K 4.7  --  4.7 4.5  CL 93*  --  102 104  CO2 21*  --  19* 21*  GLUCOSE 214*  --  116* 226*  BUN 20  --  10 16  CREATININE 0.47 0.31* 0.46 0.35*  CALCIUM  9.9  --  9.0 8.8*  MG  --  0.8* 2.0  --    GFR: Estimated Creatinine Clearance: 34.2 mL/min (A) (by C-G formula based on SCr of 0.35 mg/dL (L)). Liver Function Tests: Recent Labs  Lab 02/06/24 2101 02/09/24 0252  AST 23 108*  ALT 10 34  ALKPHOS 97 55  BILITOT 0.5 0.3  PROT 7.5 5.2*  ALBUMIN 3.2* 1.8*   Recent Labs  Lab 02/06/24 2101  LIPASE 33   Coagulation Profile: Recent Labs  Lab 02/06/24 2106  INR 1.3*   HbA1C: Recent Labs    02/07/24 0528  HGBA1C 7.4*   CBG: Recent Labs  Lab 02/09/24 0015 02/09/24 0403 02/09/24 0825 02/09/24 0859 02/09/24 1154  GLUCAP 256* 179* 61* 111* 189*   Thyroid Function Tests: Recent Labs    02/08/24 0850  TSH 2.320   Sepsis Labs: Recent Labs  Lab 02/06/24 2109 02/07/24 0021 02/09/24 0252  LATICACIDVEN 2.6* 2.5* 2.0*    Recent Results (from the past 240 hours)  Blood Culture (routine x 2)     Status: None (Preliminary result)   Collection Time: 02/06/24  9:05 PM   Specimen: Right Antecubital; Blood  Result Value Ref Range Status   Specimen Description   Final    RIGHT ANTECUBITAL Performed at [facility], [address] [place], [place] [address]    Special Requests   Final    BOTTLES DRAWN AEROBIC AND ANAEROBIC Blood Culture adequate volume Performed at [facility], [address], [place], [place] [address]    Culture   Final    NO GROWTH 2 DAYS Performed at [facility], [address] [place], [place] [address]    Report Status PENDING  Incomplete  Blood Culture (routine x 2)     Status: None (Preliminary result)   Collection  Time: 02/06/24  9:06 PM   Specimen: BLOOD RIGHT FOREARM  Result Value Ref Range Status   Specimen Description   Final    BLOOD RIGHT FOREARM Performed at [facility], [address] [place], [place] [address]    Special Requests   Final    BOTTLES DRAWN AEROBIC AND ANAEROBIC Blood Culture results may not be optimal due to an inadequate volume of blood received in culture bottles Performed at [facility], [address] [place], [place] [address]    Culture  Setup Time   Final    GRAM POSITIVE RODS AEROBIC BOTTLE ONLY CRITICAL RESULT CALLED TO, READ BACK BY AND VERIFIED WITH: PHARMD V BRYK [id number] FCP    Culture   Final    GRAM POSITIVE RODS IDENTIFICATION TO FOLLOW Performed at [facility], [address], [place], [place] [address]    Report Status PENDING  Incomplete  Resp panel by RT-PCR (RSV, Flu A&B, Covid) Anterior Nasal Swab     Status: None   Collection Time: 02/06/24  9:27 PM   Specimen: Anterior Nasal Swab  Result Value Ref Range Status   SARS Coronavirus 2 by RT PCR NEGATIVE NEGATIVE Final    Comment: (NOTE) SARS-CoV-2 target nucleic acids are NOT DETECTED.  The SARS-CoV-2 RNA is generally detectable in upper respiratory specimens during the acute phase of infection. The lowest concentration of SARS-CoV-2 viral copies this assay can detect is 138 copies/mL. A negative result does not preclude SARS-Cov-2 infection and should not be used as the sole basis for treatment or other patient management decisions. A negative result may occur with  improper specimen collection/handling, submission of specimen other than nasopharyngeal swab, presence of viral mutation(s) within the areas targeted by this assay, and inadequate number of viral copies(<138 copies/mL). A negative result must be combined with clinical observations, patient history, and epidemiological information. The expected result is Negative.  Fact Sheet  for Patients:  BloggerCourse.com  Fact Sheet for Healthcare Providers:  SeriousBroker.it  This test is no t yet approved or cleared by the [place]  FDA and  has been authorized for detection and/or diagnosis of SARS-CoV-2 by FDA under an Emergency Use Authorization (EUA). This EUA will remain  in effect (meaning this test can be used) for the duration of the COVID-19 declaration under Section 564(b)(1) of the Act, 21 U.S.C.section 360bbb-3(b)(1), unless the authorization is terminated  or revoked sooner.       Influenza A by PCR NEGATIVE NEGATIVE Final   Influenza B by PCR NEGATIVE NEGATIVE Final    Comment: (NOTE) The Xpert Xpress SARS-CoV-2/FLU/RSV plus assay is intended as an aid in the diagnosis of influenza from Nasopharyngeal swab specimens and should not be used as a sole basis for treatment. Nasal washings and aspirates are unacceptable for Xpert Xpress SARS-CoV-2/FLU/RSV testing.  Fact Sheet for Patients: BloggerCourse.com  Fact Sheet for Healthcare Providers: SeriousBroker.it  This test is not yet approved or cleared by the [place]  FDA and has been authorized for detection and/or diagnosis of SARS-CoV-2 by FDA under an Emergency Use Authorization (EUA). This EUA will remain in effect (meaning this test can be used) for the duration of the COVID-19 declaration under Section 564(b)(1) of the Act, 21 U.S.C. section 360bbb-3(b)(1), unless the authorization is terminated or revoked.  Resp Syncytial Virus by PCR NEGATIVE NEGATIVE Final    Comment: (NOTE) Fact Sheet for Patients: BloggerCourse.com  Fact Sheet for Healthcare Providers: SeriousBroker.it  This test is not yet approved or cleared by the [place]  FDA and has been authorized for detection and/or diagnosis of SARS-CoV-2 by FDA under an  Emergency Use Authorization (EUA). This EUA will remain in effect (meaning this test can be used) for the duration of the COVID-19 declaration under Section 564(b)(1) of the Act, 21 U.S.C. section 360bbb-3(b)(1), unless the authorization is terminated or revoked.  Performed at [facility], [address], [place], [place] [address]   Urine Culture     Status: None   Collection Time: 02/07/24 12:21 AM   Specimen: Urine, Random  Result Value Ref Range Status   Specimen Description   Final    URINE, RANDOM Performed at [facility], [address] [place], [place] [address]    Special Requests   Final    NONE Reflexed from [id number] Performed at [facility], [address], [place], [place] [address]    Culture   Final    NO GROWTH Performed at [facility], [address] [place], [place] [address]    Report Status 02/08/2024 FINAL  Final  MRSA Next Gen by PCR, Nasal     Status: None   Collection Time: 02/07/24  5:44 AM   Specimen: Urine, Clean Catch; Nasal Swab  Result Value Ref Range Status   MRSA by PCR Next Gen NOT DETECTED NOT DETECTED Final    Comment: (NOTE) The GeneXpert MRSA Assay (FDA approved for NASAL specimens only), is one component of a comprehensive MRSA colonization surveillance program. It is not intended to diagnose MRSA infection nor to guide or monitor treatment for MRSA infections. Test performance is not FDA approved in patients less than 15 years old. Performed at [facility], [address] [place], [place] [address]      Radiology Studies: No results found.  Scheduled Meds:  Chlorhexidine  Gluconate Cloth  6 each Topical Daily   cromolyn   1 drop Both Eyes QID   cycloSPORINE   1 drop Both Eyes BID   dorzolamide -timolol   1 drop Both Eyes BID   heparin   5,000 Units Subcutaneous Q8H   latanoprost   1 drop Both Eyes QHS   midodrine   10 mg Oral BID WC   Continuous  Infusions:  cefTRIAXone  (ROCEPHIN )  IV 1 g (02/09/24 0846)     LOS: 2 days   Time spent: 60 minutes  Camellia Door, DO  [facility]  02/09/2024, 1:39 PM

## 2024-02-10 DIAGNOSIS — E871 Hypo-osmolality and hyponatremia: Secondary | ICD-10-CM | POA: Diagnosis not present

## 2024-02-10 DIAGNOSIS — J189 Pneumonia, unspecified organism: Secondary | ICD-10-CM | POA: Diagnosis not present

## 2024-02-10 DIAGNOSIS — A419 Sepsis, unspecified organism: Secondary | ICD-10-CM | POA: Diagnosis not present

## 2024-02-10 DIAGNOSIS — F0390 Unspecified dementia without behavioral disturbance: Secondary | ICD-10-CM

## 2024-02-10 DIAGNOSIS — N3000 Acute cystitis without hematuria: Secondary | ICD-10-CM | POA: Diagnosis not present

## 2024-02-10 LAB — CULTURE, BLOOD (ROUTINE X 2)

## 2024-02-10 MED ORDER — CEFADROXIL 500 MG PO CAPS: 500.0000 mg | ORAL_CAPSULE | Freq: Two times a day (BID) | ORAL | 0 refills | Status: AC

## 2024-02-10 NOTE — Progress Notes (Signed)
 Discharge instructions given to her son Ozell. Son verbalized understanding and had no further questions. Pt discharged to home with son via wheelchair with all belongings and paperwork

## 2024-02-10 NOTE — Progress Notes (Signed)
 PROGRESS NOTE    Jennifer Gordon  FMW:[id number] DOB: Aug 03, 1921 DOA: 02/06/2024 PCP: Jennifer Artist [name], MD  Subjective: Pt seen and examined. Afebrile. No caregivers at bedside. Blood cx growing bacillus which is a contaminant. Stable for DC. Given her advanced age, will complete 10 total days of abx therapy. Pt to receive Day #4 of rocephin  prior to discharge.   Hospital Course: CC: vomiting, low BP. Hx of dementia. Non-verbal at baseline  HPI: 89 yo female presented with her son after ~2-3 days of not feeling well. Pt was reportedly not consistently eating and drinking and son was concerned she was getting ill. No fever documented at home but reportedly had the chills and rigors. He states that she was otherwise at her baseline, no change in bowel or urinary habits. No n/v/d. She appeared to be improving on Tuesday and Wed in the afternoon she began having worsening chills and he brought pt in.    On arrival she was noted to be hypotensive, without fever, no elevated leukocytes, cx pending, small infiltrate on R lung and other imaging relatively benign for source of infection. Started on abx for community acquired pathogens considering her living at home with her son. She was transferred to ED for evaluation on 16 norepi to maintain BP via peripheral iv.    All history is obtained from son and chart review as pt is minimally verbal and baseline dementia. Just continues to moan that it hurts and please help me as her piv is manipulated for blood and medication infusion.     Significant Events: Admitted 02/06/2024 to ICU by PCCM for septic shock 02-07-2024 weaned off levophed  gtts 02-08-2024 transferred to [facility]  Admission Labs: Lipase 33 Na 128, K 4.7, CO2 of 21, BUN 20, Scr 0.47, glu 214 T prot 7.5, alb 3.2, AST 23, ALT 10, alk phos 97, t. Bili 0.5 WBC 8.6, HgB 13.2, plt 395 INR 1.3 Lactic acid 2.6 Covid/rsv/flu negative Cath UA, negative nitrite, + LE, WBC >50, no  bacteria  Admission Imaging Studies:   Significant Labs: AM Cortisol 16.1 HgBA1C of 7.4% MRSA screen negative Strep pneumo antigen Negative Legionella antigen is negative.  Significant Imaging Studies: CXR Faint airspace density in the right mid to lower lung field may represent edema or pneumonia. CT abd/pelvis Bladder wall thickening and indistinctness concerning for cystitis. Recommend clinical correlation. Mild bilateral hydronephrosis.  Extensive sigmoid diverticulosis.  No active diverticulitis. Coronary artery disease, aortoiliac atherosclerosis. Bilateral lower lobe airspace opacities concerning for pneumonia.  Antibiotic Therapy: Anti-infectives (From admission, onward)    Start     Dose/Rate Route Frequency Ordered Stop   02/07/24 1000  cefTRIAXone  (ROCEPHIN ) 1 g in sodium chloride  0.9 % 100 mL IVPB        1 g 200 mL/hr over 30 Minutes Intravenous Every 24 hours 02/07/24 0545     02/07/24 0945  azithromycin  (ZITHROMAX ) 500 mg in sodium chloride  0.9 % 250 mL IVPB        500 mg 250 mL/hr over 60 Minutes Intravenous Every 24 hours 02/07/24 0849 02/10/24 0944   02/06/24 2215  ceFEPIme  (MAXIPIME ) 2 g in sodium chloride  0.9 % 100 mL IVPB        2 g 200 mL/hr over 30 Minutes Intravenous  Once 02/06/24 2203 02/07/24 0032   02/06/24 2215  metroNIDAZOLE  (FLAGYL ) IVPB 500 mg        500 mg 100 mL/hr over 60 Minutes Intravenous  Once 02/06/24 2203 02/07/24 0034   02/06/24 2215  vancomycin  (VANCOCIN ) IVPB 1000 mg/200 mL premix        1,000 mg 200 mL/hr over 60 Minutes Intravenous  Once 02/06/24 2203 02/07/24 0036       Procedures:   Consultants: PCCM    Assessment and Plan: * Septic shock (HCC) 02-08-2024 present on admission. admitted to ICU by PCCM for septic shock. WBC 8.6, lactic acid 2.6, BP as low as 55/39.  Was placed on levophed . Give IVF, IV hydrocortisone , IV ABX. BP improved with IVF and vasopressors. Levophed  weaned off by 2 pm on 02-07-2024. Pt transferred to  [facility].  Being treated for CAP and UTI as source of her septic shock.  Community acquired bilateral lower lobe pneumonia 02-08-2024 on IV rocephin , IV zithromax  both Day #2. Check legionella, strep pneum antigen.  02-09-2024 completed 3 days of IV Zithromax . On RA. IV Rocephin  Day #3. Strep pneumo antigen Negative. Awaiting blood cx on GPR. Likely contaminant. Will probably be able to DC to home tomorrow. Caregivers states pt is doing well at home.  02-10-2024 Day #4 Rocephin . Will discharge to complete today of 10 days of abx including her IV rocephin  days given her septic shock on admission. Blood cx growing bacillus which is a contaminant. Completed treatment with duricef 500 mg bid x 6 days at home. Legionella antigen is negative.  Acute cystitis 02-08-2024 on IV rocephin  Day #2. Urine cx pending.  02-09-2024 on IV rocephin  Day #3. Urine cx negative.  02-10-2024 Day #4 Rocephin . Urine cx negative. Blood cx growing bacillus which is a contaminant   Chronic hyponatremia - due to low solute intake. urine Na <30. baseline Na 130 02-08-2024 chronic. Baseline around 130s. AM cortisol was normal. Check TSH. Send urine osm, urine Na, serum Osm. Start IV NS as pt is not eating and drinking well. My suspicion is that her hyponatremia is from chronic low solute intake. Urine Na will make diagnosis.  02-09-2024 urine Na is <30, consistent with low solute intake as a factor in her hyponatremia. Pt was placed on NS overnight and Na increased from 128->131. Pt on low salt diet at home. Discussed with caregivers that pt can liberalizes her salt intake at home.  02-10-2024 chronic  Bedbound 02-08-2024 confirmed with pt's son Jennifer Gordon that pt is bedbound.  Type 2 diabetes mellitus with unspecified complications (HCC) 02-08-2024 on SSI. A1C of 7.4%. however given her advance age of 89 yo, I would not advocate for any stricter glucose control.  02-09-2024 had hypoglycemia this AM. Started on D5LR due to  hypoglycemia. Will stop SSI.  02-10-2024 stable. Pt can restart metformin  at home.  Dementia without behavioral disturbance (HCC) - Non-verbal at baseline 02-08-2024 chronic.  02-09-2024 chronic. Pt will be 89 years old in November 2025.  02-10-2024 chronic  DNR (do not resuscitate) 02-08-2024 Confirmed DNR status with pt's son    DVT prophylaxis: heparin  injection 5,000 Units Start: 02/07/24 0600 SCDs Start: 02/07/24 0544     Code Status: Limited: Do not attempt resuscitation (DNR) -DNR-LIMITED -Do Not Intubate/DNI  Family Communication: spoke with pt's caregivers yesterday Disposition Plan: home Reason for continuing need for hospitalization: stable for DC today.  Objective: Vitals:   02/09/24 2023 02/10/24 0448 02/10/24 0500 02/10/24 0743  BP: 118/67 117/71  120/74  Pulse: 81 69  84  Resp: 16 16  18   Temp: 97.9 F (36.6 C) 97.6 F (36.4 C)  (!) 97.5 F (36.4 C)  TempSrc: Oral Oral  Oral  SpO2: 96% 93%  100%  Weight:  65.7 kg   Height:        Intake/Output Summary (Last 24 hours) at 02/10/2024 0848 Last data filed at 02/10/2024 0800 Gross per 24 hour  Intake 240 ml  Output 300 ml  Net -60 ml   Filed Weights   02/08/24 0412 02/09/24 0500 02/10/24 0500  Weight: 59.3 kg 66 kg 65.7 kg    Examination:  Physical Exam Vitals and nursing note reviewed.  Constitutional:      Comments: Chronically ill appearing. Elderly. No distress  HENT:     Head: Normocephalic and atraumatic.  Cardiovascular:     Rate and Rhythm: Normal rate and regular rhythm.  Pulmonary:     Effort: Pulmonary effort is normal.  Abdominal:     General: Bowel sounds are normal. There is no distension.     Palpations: Abdomen is soft.  Skin:    General: Skin is warm and dry.     Capillary Refill: Capillary refill takes less than 2 seconds.     Data Reviewed: I have personally reviewed following labs and imaging studies  CBC: Recent Labs  Lab 02/06/24 2101 02/07/24 0528  02/08/24 0851 02/09/24 0252  WBC 8.6 16.4* 9.9 9.2  NEUTROABS  --   --   --  7.1  HGB 13.2 8.0* 10.3* 9.4*  HCT 40.1 25.1* 32.3* 29.4*  MCV 82.9 86.9 84.8 85.5  PLT 395 269 240 280   Basic Metabolic Panel: Recent Labs  Lab 02/06/24 2101 02/07/24 0528 02/08/24 0851 02/09/24 0252  NA 128*  --  130* 131*  K 4.7  --  4.7 4.5  CL 93*  --  102 104  CO2 21*  --  19* 21*  GLUCOSE 214*  --  116* 226*  BUN 20  --  10 16  CREATININE 0.47 0.31* 0.46 0.35*  CALCIUM  9.9  --  9.0 8.8*  MG  --  0.8* 2.0  --    GFR: Estimated Creatinine Clearance: 34.1 mL/min (A) (by C-G formula based on SCr of 0.35 mg/dL (L)). Liver Function Tests: Recent Labs  Lab 02/06/24 2101 02/09/24 0252  AST 23 108*  ALT 10 34  ALKPHOS 97 55  BILITOT 0.5 0.3  PROT 7.5 5.2*  ALBUMIN 3.2* 1.8*   Recent Labs  Lab 02/06/24 2101  LIPASE 33   Coagulation Profile: Recent Labs  Lab 02/06/24 2106  INR 1.3*   CBG: Recent Labs  Lab 02/09/24 0403 02/09/24 0825 02/09/24 0859 02/09/24 1154 02/09/24 1658  GLUCAP 179* 61* 111* 189* 170*   Thyroid Function Tests: Recent Labs    02/08/24 0850  TSH 2.320   Sepsis Labs: Recent Labs  Lab 02/06/24 2109 02/07/24 0021 02/09/24 0252  LATICACIDVEN 2.6* 2.5* 2.0*    Recent Results (from the past 240 hours)  Blood Culture (routine x 2)     Status: None (Preliminary result)   Collection Time: 02/06/24  9:05 PM   Specimen: Right Antecubital; Blood  Result Value Ref Range Status   Specimen Description   Final    RIGHT ANTECUBITAL Performed at [facility], [address], [place], [place] [address]    Special Requests   Final    BOTTLES DRAWN AEROBIC AND ANAEROBIC Blood Culture adequate volume Performed at [facility], [address], [place], [place] [address]    Culture   Final    NO GROWTH 3 DAYS Performed at [facility], [address] [place], [place] [address]    Report Status PENDING  Incomplete  Blood Culture (routine x 2)     Status: Abnormal   Collection Time: 02/06/24  9:06 PM   Specimen: BLOOD RIGHT FOREARM  Result Value Ref Range Status   Specimen Description   Final    BLOOD RIGHT FOREARM Performed at [facility], [address] [place], [place] [address]    Special Requests   Final    BOTTLES DRAWN AEROBIC AND ANAEROBIC Blood Culture results may not be optimal due to an inadequate volume of blood received in culture bottles Performed at [facility], [address], [place], [place] [address]    Culture  Setup Time   Final    GRAM POSITIVE RODS AEROBIC BOTTLE ONLY CRITICAL RESULT CALLED TO, READ BACK BY AND VERIFIED WITH: PHARMD V BRYK 0451 [id number] FCP    Culture (A)  Final    BACILLUS SPECIES Standardized susceptibility testing for this organism is not available. Performed at [facility], [address] [place], [place] [address]    Report Status 02/10/2024 FINAL  Final  Resp panel by RT-PCR (RSV, Flu A&B, Covid) Anterior Nasal Swab     Status: None   Collection Time: 02/06/24  9:27 PM   Specimen: Anterior Nasal Swab  Result Value Ref Range Status   SARS Coronavirus 2 by RT PCR NEGATIVE NEGATIVE Final    Comment: (NOTE) SARS-CoV-2 target nucleic acids are NOT DETECTED.  The SARS-CoV-2 RNA is generally detectable in upper respiratory specimens during the acute phase of infection. The lowest concentration of SARS-CoV-2 viral copies this assay can detect is 138 copies/mL. A negative result does not preclude SARS-Cov-2 infection and should not be used as the sole basis for treatment or other patient management decisions. A negative result may occur with  improper specimen collection/handling, submission of specimen other than nasopharyngeal swab, presence of viral mutation(s) within the areas targeted by this assay, and inadequate number of viral copies(<138 copies/mL). A negative result must be  combined with clinical observations, patient history, and epidemiological information. The expected result is Negative.  Fact Sheet for Patients:  BloggerCourse.com  Fact Sheet for Healthcare Providers:  SeriousBroker.it  This test is no t yet approved or cleared by the [place]  FDA and  has been authorized for detection and/or diagnosis of SARS-CoV-2 by FDA under an Emergency Use Authorization (EUA). This EUA will remain  in effect (meaning this test can be used) for the duration of the COVID-19 declaration under Section 564(b)(1) of the Act, 21 U.S.C.section 360bbb-3(b)(1), unless the authorization is terminated  or revoked sooner.       Influenza A by PCR NEGATIVE NEGATIVE Final   Influenza B by PCR NEGATIVE NEGATIVE Final    Comment: (NOTE) The Xpert Xpress SARS-CoV-2/FLU/RSV plus assay is intended as an aid in the diagnosis of influenza from Nasopharyngeal swab specimens and should not be used as a sole basis for treatment. Nasal washings and aspirates are unacceptable for Xpert Xpress SARS-CoV-2/FLU/RSV testing.  Fact Sheet for Patients: BloggerCourse.com  Fact Sheet for Healthcare Providers: SeriousBroker.it  This test is not yet approved or cleared by the [place]  FDA and has been authorized for detection and/or diagnosis of SARS-CoV-2 by FDA under an Emergency Use Authorization (EUA). This EUA will remain in effect (meaning this test can be used) for the duration of the COVID-19 declaration under Section 564(b)(1) of the Act, 21 U.S.C. section 360bbb-3(b)(1), unless the authorization is terminated or revoked.     Resp Syncytial Virus by PCR NEGATIVE  NEGATIVE Final    Comment: (NOTE) Fact Sheet for Patients: BloggerCourse.com  Fact Sheet for Healthcare Providers: SeriousBroker.it  This test is not yet  approved or cleared by the [place]  FDA and has been authorized for detection and/or diagnosis of SARS-CoV-2 by FDA under an Emergency Use Authorization (EUA). This EUA will remain in effect (meaning this test can be used) for the duration of the COVID-19 declaration under Section 564(b)(1) of the Act, 21 U.S.C. section 360bbb-3(b)(1), unless the authorization is terminated or revoked.  Performed at [facility], [address], [place], [place] [address]   Urine Culture     Status: None   Collection Time: 02/07/24 12:21 AM   Specimen: Urine, Random  Result Value Ref Range Status   Specimen Description   Final    URINE, RANDOM Performed at [facility], [address], [place], [place] [address]    Special Requests   Final    NONE Reflexed from [id number] Performed at [facility], [address] [place], [place] [address]    Culture   Final    NO GROWTH Performed at [facility], [address], [place], [place] [address]    Report Status 02/08/2024 FINAL  Final  MRSA Next Gen by PCR, Nasal     Status: None   Collection Time: 02/07/24  5:44 AM   Specimen: Urine, Clean Catch; Nasal Swab  Result Value Ref Range Status   MRSA by PCR Next Gen NOT DETECTED NOT DETECTED Final    Comment: (NOTE) The GeneXpert MRSA Assay (FDA approved for NASAL specimens only), is one component of a comprehensive MRSA colonization surveillance program. It is not intended to diagnose MRSA infection nor to guide or monitor treatment for MRSA infections. Test performance is not FDA approved in patients less than 74 years old. Performed at [facility], [address], [place], [place] [address]      Scheduled Meds:  Chlorhexidine  Gluconate Cloth  6 each Topical Daily   cromolyn   1 drop Both Eyes QID   cycloSPORINE   1 drop Both Eyes BID   dorzolamide -timolol   1 drop Both Eyes BID   heparin   5,000 Units Subcutaneous Q8H    latanoprost   1 drop Both Eyes QHS   midodrine   10 mg Oral BID WC   Continuous Infusions:  cefTRIAXone  (ROCEPHIN )  IV 1 g (02/10/24 0842)     LOS: 3 days   Time spent: 55 minutes  Camellia Door, DO  [facility]  02/10/2024, 8:48 AM

## 2024-02-10 NOTE — Discharge Summary (Signed)
 [facility] Physician Discharge Summary   Patient name: Jennifer Gordon  Admit date:     02/06/2024  Discharge date: 02/10/2024  Attending Physician: MARSHALL, JESSICA [[id number]]  Discharge Physician: Camellia Door   PCP: Delayne Artist [name], MD  Admitted From: Home  Disposition:  Home  Recommendations for Outpatient Follow-up:  Follow up with PCP in 1-2 weeks  Home Health:No Equipment/Devices: None    Discharge Condition:Stable CODE STATUS:DNR/DNI Diet recommendation: Heart Healthy Fluid Restriction: None  Hospital Summary: CC: vomiting, low BP. Hx of dementia. Non-verbal at baseline  HPI: 89 yo female presented with her son after ~2-3 days of not feeling well. Pt was reportedly not consistently eating and drinking and son was concerned she was getting ill. No fever documented at home but reportedly had the chills and rigors. He states that she was otherwise at her baseline, no change in bowel or urinary habits. No n/v/d. She appeared to be improving on Tuesday and Wed in the afternoon she began having worsening chills and he brought pt in.    On arrival she was noted to be hypotensive, without fever, no elevated leukocytes, cx pending, small infiltrate on R lung and other imaging relatively benign for source of infection. Started on abx for community acquired pathogens considering her living at home with her son. She was transferred to ED for evaluation on 16 norepi to maintain BP via peripheral iv.    All history is obtained from son and chart review as pt is minimally verbal and baseline dementia. Just continues to moan that it hurts and please help me as her piv is manipulated for blood and medication infusion.     Significant Events: Admitted 02/06/2024 to ICU by PCCM for septic shock 02-07-2024 weaned off levophed  gtts 02-08-2024 transferred to [facility]  Admission Labs: Lipase 33 Na 128, K 4.7, CO2 of 21, BUN 20, Scr 0.47, glu 214 T prot 7.5, alb 3.2, AST 23, ALT 10,  alk phos 97, t. Bili 0.5 WBC 8.6, HgB 13.2, plt 395 INR 1.3 Lactic acid 2.6 Covid/rsv/flu negative Cath UA, negative nitrite, + LE, WBC >50, no bacteria  Admission Imaging Studies:   Significant Labs: AM Cortisol 16.1 HgBA1C of 7.4% MRSA screen negative Strep pneumo antigen Negative Legionella antigen is negative.  Significant Imaging Studies: CXR Faint airspace density in the right mid to lower lung field may represent edema or pneumonia. CT abd/pelvis Bladder wall thickening and indistinctness concerning for cystitis. Recommend clinical correlation. Mild bilateral hydronephrosis.  Extensive sigmoid diverticulosis.  No active diverticulitis. Coronary artery disease, aortoiliac atherosclerosis. Bilateral lower lobe airspace opacities concerning for pneumonia.  Antibiotic Therapy: Anti-infectives (From admission, onward)    Start     Dose/Rate Route Frequency Ordered Stop   02/07/24 1000  cefTRIAXone  (ROCEPHIN ) 1 g in sodium chloride  0.9 % 100 mL IVPB        1 g 200 mL/hr over 30 Minutes Intravenous Every 24 hours 02/07/24 0545     02/07/24 0945  azithromycin  (ZITHROMAX ) 500 mg in sodium chloride  0.9 % 250 mL IVPB        500 mg 250 mL/hr over 60 Minutes Intravenous Every 24 hours 02/07/24 0849 02/10/24 0944   02/06/24 2215  ceFEPIme  (MAXIPIME ) 2 g in sodium chloride  0.9 % 100 mL IVPB        2 g 200 mL/hr over 30 Minutes Intravenous  Once 02/06/24 2203 02/07/24 0032   02/06/24 2215  metroNIDAZOLE  (FLAGYL ) IVPB 500 mg  500 mg 100 mL/hr over 60 Minutes Intravenous  Once 02/06/24 2203 02/07/24 0034   02/06/24 2215  vancomycin  (VANCOCIN ) IVPB 1000 mg/200 mL premix        1,000 mg 200 mL/hr over 60 Minutes Intravenous  Once 02/06/24 2203 02/07/24 0036       Procedures:   Consultants: [facility] Course by Problem: * Septic shock (HCC) 02-08-2024 present on admission. admitted to ICU by PCCM for septic shock. WBC 8.6, lactic acid 2.6, BP as low as 55/39.  Was  placed on levophed . Give IVF, IV hydrocortisone , IV ABX. BP improved with IVF and vasopressors. Levophed  weaned off by 2 pm on 02-07-2024. Pt transferred to [facility].  Being treated for CAP and UTI as source of her septic shock.  Community acquired bilateral lower lobe pneumonia 02-08-2024 on IV rocephin , IV zithromax  both Day #2. Check legionella, strep pneum antigen.  02-09-2024 completed 3 days of IV Zithromax . On RA. IV Rocephin  Day #3. Strep pneumo antigen Negative. Awaiting blood cx on GPR. Likely contaminant. Will probably be able to DC to home tomorrow. Caregivers states pt is doing well at home.  02-10-2024 Day #4 Rocephin . Will discharge to complete today of 10 days of abx including her IV rocephin  days given her septic shock on admission. Blood cx growing bacillus which is a contaminant. Completed treatment with duricef 500 mg bid x 6 days at home. Legionella antigen is negative.  Acute cystitis 02-08-2024 on IV rocephin  Day #2. Urine cx pending.  02-09-2024 on IV rocephin  Day #3. Urine cx negative.  02-10-2024 Day #4 Rocephin . Urine cx negative. Blood cx growing bacillus which is a contaminant   Chronic hyponatremia - due to low solute intake. urine Na <30. baseline Na 130 02-08-2024 chronic. Baseline around 130s. AM cortisol was normal. Check TSH. Send urine osm, urine Na, serum Osm. Start IV NS as pt is not eating and drinking well. My suspicion is that her hyponatremia is from chronic low solute intake. Urine Na will make diagnosis.  02-09-2024 urine Na is <30, consistent with low solute intake as a factor in her hyponatremia. Pt was placed on NS overnight and Na increased from 128->131. Pt on low salt diet at home. Discussed with caregivers that pt can liberalizes her salt intake at home.  02-10-2024 chronic  Bedbound 02-08-2024 confirmed with pt's son Garrel that pt is bedbound.  Type 2 diabetes mellitus with unspecified complications (HCC) 02-08-2024 on SSI. A1C of 7.4%. however  given her advance age of 89 yo, I would not advocate for any stricter glucose control.  02-09-2024 had hypoglycemia this AM. Started on D5LR due to hypoglycemia. Will stop SSI.  02-10-2024 stable. Pt can restart metformin  at home.  Dementia without behavioral disturbance (HCC) - Non-verbal at baseline 02-08-2024 chronic.  02-09-2024 chronic. Pt will be 89 years old in November 2025.  02-10-2024 chronic  DNR (do not resuscitate) 02-08-2024 Confirmed DNR status with pt's son      Discharge Diagnoses:  Principal Problem:   Septic shock (HCC) Active Problems:   Acute cystitis   Community acquired bilateral lower lobe pneumonia   Chronic hyponatremia - due to low solute intake. urine Na <30. baseline Na 130   Dementia without behavioral disturbance (HCC) - Non-verbal at baseline   Type 2 diabetes mellitus with unspecified complications (HCC)   Bedbound   DNR (do not resuscitate)   Discharge Instructions  Discharge Instructions     Call MD for:  difficulty breathing, headache or visual disturbances  Complete by: As directed    Call MD for:  extreme fatigue   Complete by: As directed    Call MD for:  hives   Complete by: As directed    Call MD for:  persistant dizziness or light-headedness   Complete by: As directed    Call MD for:  persistant nausea and vomiting   Complete by: As directed    Call MD for:  redness, tenderness, or signs of infection (pain, swelling, redness, odor or green/yellow discharge around incision site)   Complete by: As directed    Call MD for:  severe uncontrolled pain   Complete by: As directed    Call MD for:  temperature >100.4   Complete by: As directed    Diet - low sodium heart healthy   Complete by: As directed    Discharge instructions   Complete by: As directed    1. Follow up with your primary care provider in 1-2 weeks following discharge from hospital.   Increase activity slowly   Complete by: As directed    No wound care    Complete by: As directed       Allergies as of 02/10/2024       Reactions   Atorvastatin Other (See Comments)   Nsaids Other (See Comments)   Cephalexin Rash   Codeine Rash   Angioedema (ALLERGY/intolerance) Patient stated,  I don't remember what happens. I think its a body rash.   Ibandronate Rash   Lipitor [atorvastatin Calcium ] Rash   Angioedema (ALLERGY/intolerance) Patient stated,  I don't remember what happens. I think its a body rash.   Niaspan [niacin Er (antihyperlipidemic)] Rash   Angioedema (ALLERGY/intolerance) Patient stated,  I don't remember what happens. I think its a body rash.   Nitrofurantoin Rash   Ticlid [ticlopidine Hcl] Rash   Angioedema (ALLERGY/intolerance) Pt. Stated,  I can't remember what happens. I think its a body rash.   Zocor [simvastatin - High Dose] Rash   Angioedema (ALLERGY/intolerance) Patient stated,  I don't remember what happens. I think its a body rash.        Medication List     TAKE these medications    acarbose  25 MG tablet Commonly known as: PRECOSE  Take 25 mg by mouth every evening.   acetaminophen  500 MG tablet Commonly known as: TYLENOL  Take 1 tablet (500 mg total) by mouth every 4 (four) hours as needed for moderate pain.   Aspirin  Low Dose 81 MG tablet Generic drug: aspirin  EC Take 81 mg by mouth daily.   cefadroxil  500 MG capsule Commonly known as: DURICEF Take 1 capsule (500 mg total) by mouth 2 (two) times daily for 6 days. Start taking on: February 11, 2024   cromolyn  4 % ophthalmic solution Commonly known as: OPTICROM  1 drop 4 (four) times daily.   cycloSPORINE  0.05 % ophthalmic emulsion Commonly known as: RESTASIS  Place 1 drop into both eyes 2 (two) times daily. Use for dry eyes.   dorzolamide -timolol  2-0.5 % ophthalmic solution Commonly known as: COSOPT  Place 1 drop into both eyes 2 (two) times daily. Use for glaucoma   Ensure High Protein Liqd Take 1 Bottle by mouth daily.    latanoprost  0.005 % ophthalmic solution Commonly known as: XALATAN  Place 1 drop into both eyes at bedtime.   OCUSOFT LID SCRUB EX Apply topically 2 (two) times daily. As directed   polyethylene glycol 17 g packet Commonly known as: MiraLax  Take 17 g by mouth daily. What changed: when to  take this   Prolia  60 MG/ML Sosy injection Generic drug: denosumab  Inject 60 mg into the skin every 6 (six) months. September/October was last injection, Caregiver wasn't sure which month.   rosuvastatin  10 MG tablet Commonly known as: CRESTOR  Take 10 mg by mouth daily.        Allergies  Allergen Reactions   Atorvastatin Other (See Comments)   Nsaids Other (See Comments)   Cephalexin Rash   Codeine Rash    Angioedema (ALLERGY/intolerance) Patient stated,  I don't remember what happens. I think its a body rash.   Ibandronate Rash   Lipitor [Atorvastatin Calcium ] Rash    Angioedema (ALLERGY/intolerance) Patient stated,  I don't remember what happens. I think its a body rash.   Niaspan [Niacin Er (Antihyperlipidemic)] Rash    Angioedema (ALLERGY/intolerance) Patient stated,  I don't remember what happens. I think its a body rash.   Nitrofurantoin Rash   Ticlid [Ticlopidine Hcl] Rash    Angioedema (ALLERGY/intolerance) Pt. Stated,  I can't remember what happens. I think its a body rash.   Zocor [Simvastatin - High Dose] Rash    Angioedema (ALLERGY/intolerance) Patient stated,  I don't remember what happens. I think its a body rash.    Discharge Exam: Vitals:   02/10/24 0448 02/10/24 0743  BP: 117/71 120/74  Pulse: 69 84  Resp: 16 18  Temp: 97.6 F (36.4 C) (!) 97.5 F (36.4 C)  SpO2: 93% 100%    Physical Exam Vitals and nursing note reviewed.  Constitutional:      Comments: Chronically ill appearing. Elderly. No distress  HENT:     Head: Normocephalic and atraumatic.  Cardiovascular:     Rate and Rhythm: Normal rate and regular rhythm.  Pulmonary:      Effort: Pulmonary effort is normal.  Abdominal:     General: Bowel sounds are normal. There is no distension.     Palpations: Abdomen is soft.  Skin:    General: Skin is warm and dry.     Capillary Refill: Capillary refill takes less than 2 seconds.     The results of significant diagnostics from this hospitalization (including imaging, microbiology, ancillary and laboratory) are listed below for reference.    Microbiology: Recent Results (from the past 240 hours)  Blood Culture (routine x 2)     Status: None (Preliminary result)   Collection Time: 02/06/24  9:05 PM   Specimen: Right Antecubital; Blood  Result Value Ref Range Status   Specimen Description   Final    RIGHT ANTECUBITAL Performed at [facility], [address], [place], [place] [address]    Special Requests   Final    BOTTLES DRAWN AEROBIC AND ANAEROBIC Blood Culture adequate volume Performed at [facility], [address] [place], [place] [address]    Culture   Final    NO GROWTH 3 DAYS Performed at [facility], [address] [place], [place] [address]    Report Status PENDING  Incomplete  Blood Culture (routine x 2)     Status: Abnormal   Collection Time: 02/06/24  9:06 PM   Specimen: BLOOD RIGHT FOREARM  Result Value Ref Range Status   Specimen Description   Final    BLOOD RIGHT FOREARM Performed at [facility], [address], [place], [place] [address]    Special Requests   Final    BOTTLES DRAWN AEROBIC AND ANAEROBIC Blood Culture results may not be optimal due to an inadequate volume of blood  received in culture bottles Performed at [facility], [address], [place], [place] [address]    Culture  Setup Time   Final    GRAM POSITIVE RODS AEROBIC BOTTLE ONLY CRITICAL RESULT CALLED TO, READ BACK BY AND VERIFIED WITH: PHARMD V BRYK 0451 [id number] FCP    Culture (A)  Final    BACILLUS SPECIES Standardized  susceptibility testing for this organism is not available. Performed at [facility], [address] [place], [place] [address]    Report Status 02/10/2024 FINAL  Final  Resp panel by RT-PCR (RSV, Flu A&B, Covid) Anterior Nasal Swab     Status: None   Collection Time: 02/06/24  9:27 PM   Specimen: Anterior Nasal Swab  Result Value Ref Range Status   SARS Coronavirus 2 by RT PCR NEGATIVE NEGATIVE Final    Comment: (NOTE) SARS-CoV-2 target nucleic acids are NOT DETECTED.  The SARS-CoV-2 RNA is generally detectable in upper respiratory specimens during the acute phase of infection. The lowest concentration of SARS-CoV-2 viral copies this assay can detect is 138 copies/mL. A negative result does not preclude SARS-Cov-2 infection and should not be used as the sole basis for treatment or other patient management decisions. A negative result may occur with  improper specimen collection/handling, submission of specimen other than nasopharyngeal swab, presence of viral mutation(s) within the areas targeted by this assay, and inadequate number of viral copies(<138 copies/mL). A negative result must be combined with clinical observations, patient history, and epidemiological information. The expected result is Negative.  Fact Sheet for Patients:  BloggerCourse.com  Fact Sheet for Healthcare Providers:  SeriousBroker.it  This test is no t yet approved or cleared by the [place]  FDA and  has been authorized for detection and/or diagnosis of SARS-CoV-2 by FDA under an Emergency Use Authorization (EUA). This EUA will remain  in effect (meaning this test can be used) for the duration of the COVID-19 declaration under Section 564(b)(1) of the Act, 21 U.S.C.section 360bbb-3(b)(1), unless the authorization is terminated  or revoked sooner.       Influenza A by PCR NEGATIVE NEGATIVE Final   Influenza B by PCR NEGATIVE NEGATIVE Final     Comment: (NOTE) The Xpert Xpress SARS-CoV-2/FLU/RSV plus assay is intended as an aid in the diagnosis of influenza from Nasopharyngeal swab specimens and should not be used as a sole basis for treatment. Nasal washings and aspirates are unacceptable for Xpert Xpress SARS-CoV-2/FLU/RSV testing.  Fact Sheet for Patients: BloggerCourse.com  Fact Sheet for Healthcare Providers: SeriousBroker.it  This test is not yet approved or cleared by the [place]  FDA and has been authorized for detection and/or diagnosis of SARS-CoV-2 by FDA under an Emergency Use Authorization (EUA). This EUA will remain in effect (meaning this test can be used) for the duration of the COVID-19 declaration under Section 564(b)(1) of the Act, 21 U.S.C. section 360bbb-3(b)(1), unless the authorization is terminated or revoked.     Resp Syncytial Virus by PCR NEGATIVE NEGATIVE Final    Comment: (NOTE) Fact Sheet for Patients: BloggerCourse.com  Fact Sheet for Healthcare Providers: SeriousBroker.it  This test is not yet approved or cleared by the [place]  FDA and has been authorized for detection and/or diagnosis of SARS-CoV-2 by FDA under an Emergency Use Authorization (EUA). This EUA will remain in effect (meaning this test can be used) for the duration of the COVID-19 declaration under Section 564(b)(1) of the Act, 21 U.S.C. section 360bbb-3(b)(1), unless the authorization is terminated or revoked.  Performed at [facility], [address], [place], [place] [address]   Urine Culture     Status: None   Collection Time: 02/07/24 12:21 AM   Specimen: Urine, Random  Result Value Ref Range Status   Specimen Description   Final    URINE, RANDOM Performed at [facility], [address] [place], [place] [address]    Special Requests   Final    NONE  Reflexed from [id number] Performed at [facility], [address], [place], [place] [address]    Culture   Final    NO GROWTH Performed at [facility], [address], [place], [place] [address]    Report Status 02/08/2024 FINAL  Final  MRSA Next Gen by PCR, Nasal     Status: None   Collection Time: 02/07/24  5:44 AM   Specimen: Urine, Clean Catch; Nasal Swab  Result Value Ref Range Status   MRSA by PCR Next Gen NOT DETECTED NOT DETECTED Final    Comment: (NOTE) The GeneXpert MRSA Assay (FDA approved for NASAL specimens only), is one component of a comprehensive MRSA colonization surveillance program. It is not intended to diagnose MRSA infection nor to guide or monitor treatment for MRSA infections. Test performance is not FDA approved in patients less than 17 years old. Performed at [facility], [address] [place], [place] [address]      Labs: Basic Metabolic Panel: Recent Labs  Lab 02/06/24 2101 02/07/24 0528 02/08/24 0851 02/09/24 0252  NA 128*  --  130* 131*  K 4.7  --  4.7 4.5  CL 93*  --  102 104  CO2 21*  --  19* 21*  GLUCOSE 214*  --  116* 226*  BUN 20  --  10 16  CREATININE 0.47 0.31* 0.46 0.35*  CALCIUM  9.9  --  9.0 8.8*  MG  --  0.8* 2.0  --    Liver Function Tests: Recent Labs  Lab 02/06/24 2101 02/09/24 0252  AST 23 108*  ALT 10 34  ALKPHOS 97 55  BILITOT 0.5 0.3  PROT 7.5 5.2*  ALBUMIN 3.2* 1.8*   Recent Labs  Lab 02/06/24 2101  LIPASE 33   CBC: Recent Labs  Lab 02/06/24 2101 02/07/24 0528 02/08/24 0851 02/09/24 0252  WBC 8.6 16.4* 9.9 9.2  NEUTROABS  --   --   --  7.1  HGB 13.2 8.0* 10.3* 9.4*  HCT 40.1 25.1* 32.3* 29.4*  MCV 82.9 86.9 84.8 85.5  PLT 395 269 240 280   CBG: Recent Labs  Lab 02/09/24 0403 02/09/24 0825 02/09/24 0859 02/09/24 1154 02/09/24 1658  GLUCAP 179* 61* 111* 189* 170*   Thyroid function studies Recent Labs    02/08/24 0850  TSH 2.320   Urinalysis     Component Value Date/Time   COLORURINE YELLOW 02/07/2024 0925   APPEARANCEUR HAZY (A) 02/07/2024 0925   LABSPEC 1.028 02/07/2024 0925   PHURINE 6.0 02/07/2024 0925   GLUCOSEU 50 (A) 02/07/2024 0925   HGBUR NEGATIVE 02/07/2024 0925   BILIRUBINUR NEGATIVE 02/07/2024 0925   KETONESUR 5 (A) 02/07/2024 0925   PROTEINUR 100 (A) 02/07/2024 0925   NITRITE NEGATIVE 02/07/2024 0925   LEUKOCYTESUR MODERATE (A) 02/07/2024 0925   Sepsis Labs Recent Labs  Lab 02/06/24 2101 02/07/24 0528 02/08/24 0851 02/09/24 0252  WBC 8.6 16.4* 9.9 9.2   Pneumonia Antigens Lab Results  Component Value Date/Time   [facility] NEGATIVE 02/08/2024 02:50 PM  Component     Latest Ref Rng 02/08/2024  L. pneumophila Serogp 1 Ur Ag     Negative  Negative      Procedures/Studies: CT ABDOMEN PELVIS W CONTRAST Result Date: 02/07/2024 CLINICAL DATA:  Abdominal pain EXAM: CT ABDOMEN AND PELVIS WITH CONTRAST TECHNIQUE: Multidetector CT imaging of the abdomen and pelvis was performed using the standard protocol following bolus administration of intravenous contrast. RADIATION DOSE REDUCTION: This exam was performed according to the departmental dose-optimization program which includes automated exposure control, adjustment of the mA and/or kV according to patient size and/or use of iterative reconstruction technique. CONTRAST:  75mL OMNIPAQUE  IOHEXOL  300 MG/ML  SOLN COMPARISON:  11/22/2023 FINDINGS: Lower chest: Airspace disease in the lower lobes bilaterally concerning for pneumonia. Coronary artery and aortic atherosclerosis. No effusions. Hepatobiliary: No focal hepatic abnormality. Gallbladder unremarkable. Pancreas: No focal abnormality or ductal dilatation. Spleen: No focal abnormality.  Normal size. Adrenals/Urinary Tract: Adrenal glands normal. Urinary bladder is thick walled with indistinct bladder wall. Appearance is concerning for cystitis. Multiple renal cysts bilaterally, the largest in the left upper pole.  Mild bilateral hydronephrosis. No visible stones. Stomach/Bowel: Extensive sigmoid diverticulosis. No active diverticulitis. Stomach and small bowel decompressed. No bowel obstruction or inflammatory process. Vascular/Lymphatic: Aortoiliac atherosclerosis. No evidence of aneurysm or adenopathy. Reproductive: Uterus and adnexa unremarkable.  No mass. Other: No free fluid or free air. Musculoskeletal: No acute bony abnormality IMPRESSION: Bladder wall thickening and indistinctness concerning for cystitis. Recommend clinical correlation. Mild bilateral hydronephrosis. Extensive sigmoid diverticulosis.  No active diverticulitis. Coronary artery disease, aortoiliac atherosclerosis. Bilateral lower lobe airspace opacities concerning for pneumonia. Electronically Signed   By: Franky Crease M.D.   On: 02/07/2024 00:17   DG Chest Portable 1 View Result Date: 02/06/2024 CLINICAL DATA:  Fever. EXAM: PORTABLE CHEST 1 VIEW COMPARISON:  Chest radiograph dated 05/11/2023. FINDINGS: There is diffuse interstitial prominence. Faint airspace density in the right mid to lower lung field may represent edema or pneumonia. No pleural effusion or pneumothorax. Stable cardiac silhouette. Coronary vascular calcification or stent. Atherosclerotic calcification of the aorta. No acute osseous pathology. IMPRESSION: Faint airspace density in the right mid to lower lung field may represent edema or pneumonia. Electronically Signed   By: Vanetta Chou M.D.   On: 02/06/2024 22:12    Time coordinating discharge: 55 mins  SIGNED:  Camellia Door, DO [facility] 02/10/24, 8:53 AM

## 2024-02-12 LAB — CULTURE, BLOOD (ROUTINE X 2)
Culture: NO GROWTH
Special Requests: ADEQUATE

## 2024-02-26 DIAGNOSIS — N1832 Chronic kidney disease, stage 3b: Secondary | ICD-10-CM | POA: Diagnosis not present

## 2024-02-26 DIAGNOSIS — G309 Alzheimer's disease, unspecified: Secondary | ICD-10-CM | POA: Diagnosis not present

## 2024-02-26 DIAGNOSIS — I2510 Atherosclerotic heart disease of native coronary artery without angina pectoris: Secondary | ICD-10-CM | POA: Diagnosis not present

## 2024-02-26 DIAGNOSIS — I25119 Atherosclerotic heart disease of native coronary artery with unspecified angina pectoris: Secondary | ICD-10-CM | POA: Diagnosis not present
